# Patient Record
Sex: Male | Born: 1935 | Race: White | Hispanic: No | Marital: Married | Smoking: Former smoker
Health system: Southern US, Community
[De-identification: ages and names within clinical notes are randomized; demographics above are authoritative.]

## PROBLEM LIST (undated history)

## (undated) DIAGNOSIS — Z8614 Personal history of Methicillin resistant Staphylococcus aureus infection: Secondary | ICD-10-CM

## (undated) DIAGNOSIS — K449 Diaphragmatic hernia without obstruction or gangrene: Secondary | ICD-10-CM

## (undated) DIAGNOSIS — M1990 Unspecified osteoarthritis, unspecified site: Secondary | ICD-10-CM

## (undated) DIAGNOSIS — S92901A Unspecified fracture of right foot, initial encounter for closed fracture: Secondary | ICD-10-CM

## (undated) DIAGNOSIS — I2510 Atherosclerotic heart disease of native coronary artery without angina pectoris: Secondary | ICD-10-CM

## (undated) DIAGNOSIS — R55 Syncope and collapse: Secondary | ICD-10-CM

## (undated) DIAGNOSIS — Z8719 Personal history of other diseases of the digestive system: Secondary | ICD-10-CM

## (undated) DIAGNOSIS — L02214 Cutaneous abscess of groin: Secondary | ICD-10-CM

## (undated) DIAGNOSIS — C801 Malignant (primary) neoplasm, unspecified: Secondary | ICD-10-CM

## (undated) DIAGNOSIS — K219 Gastro-esophageal reflux disease without esophagitis: Secondary | ICD-10-CM

## (undated) DIAGNOSIS — I10 Essential (primary) hypertension: Secondary | ICD-10-CM

## (undated) HISTORY — DX: Essential (primary) hypertension: I10

## (undated) HISTORY — DX: Gastro-esophageal reflux disease without esophagitis: K21.9

## (undated) HISTORY — PX: YAG LASER APPLICATION: SHX6189

## (undated) HISTORY — DX: Cutaneous abscess of groin: L02.214

## (undated) HISTORY — DX: Personal history of Methicillin resistant Staphylococcus aureus infection: Z86.14

## (undated) HISTORY — DX: Unspecified fracture of right foot, initial encounter for closed fracture: S92.901A

## (undated) HISTORY — DX: Syncope and collapse: R55

## (undated) HISTORY — PX: COLONOSCOPY: SHX174

## (undated) HISTORY — PX: APPENDECTOMY: SHX54

## (undated) HISTORY — PX: HERNIA REPAIR: SHX51

## (undated) HISTORY — DX: Gastro-esophageal reflux disease without esophagitis: K44.9

## (undated) HISTORY — DX: Atherosclerotic heart disease of native coronary artery without angina pectoris: I25.10

## (undated) SURGERY — Surgical Case
Anesthesia: *Unknown

---

## 1999-03-15 ENCOUNTER — Ambulatory Visit (HOSPITAL_COMMUNITY): Admission: RE | Admit: 1999-03-15 | Discharge: 1999-03-15 | Payer: Self-pay | Admitting: Gastroenterology

## 2000-06-20 ENCOUNTER — Ambulatory Visit (HOSPITAL_BASED_OUTPATIENT_CLINIC_OR_DEPARTMENT_OTHER): Admission: RE | Admit: 2000-06-20 | Discharge: 2000-06-20 | Payer: Self-pay | Admitting: Plastic Surgery

## 2002-08-03 ENCOUNTER — Emergency Department (HOSPITAL_COMMUNITY): Admission: EM | Admit: 2002-08-03 | Discharge: 2002-08-03 | Payer: Self-pay | Admitting: Emergency Medicine

## 2003-07-16 ENCOUNTER — Encounter (INDEPENDENT_AMBULATORY_CARE_PROVIDER_SITE_OTHER): Payer: Self-pay | Admitting: Specialist

## 2003-07-16 ENCOUNTER — Ambulatory Visit (HOSPITAL_COMMUNITY): Admission: RE | Admit: 2003-07-16 | Discharge: 2003-07-16 | Payer: Self-pay | Admitting: Gastroenterology

## 2005-01-24 ENCOUNTER — Ambulatory Visit: Payer: Self-pay | Admitting: Family Medicine

## 2005-02-07 ENCOUNTER — Ambulatory Visit: Payer: Self-pay | Admitting: Family Medicine

## 2005-03-04 ENCOUNTER — Ambulatory Visit: Payer: Self-pay | Admitting: Family Medicine

## 2005-08-17 ENCOUNTER — Ambulatory Visit: Payer: Self-pay | Admitting: Family Medicine

## 2006-02-20 ENCOUNTER — Ambulatory Visit: Payer: Self-pay | Admitting: Family Medicine

## 2007-02-28 ENCOUNTER — Ambulatory Visit: Payer: Self-pay | Admitting: Family Medicine

## 2007-02-28 DIAGNOSIS — E785 Hyperlipidemia, unspecified: Secondary | ICD-10-CM | POA: Insufficient documentation

## 2007-02-28 DIAGNOSIS — I10 Essential (primary) hypertension: Secondary | ICD-10-CM | POA: Insufficient documentation

## 2007-06-22 ENCOUNTER — Emergency Department (HOSPITAL_COMMUNITY): Admission: EM | Admit: 2007-06-22 | Discharge: 2007-06-22 | Payer: Self-pay | Admitting: Emergency Medicine

## 2007-07-13 ENCOUNTER — Encounter: Payer: Self-pay | Admitting: Family Medicine

## 2007-11-05 ENCOUNTER — Ambulatory Visit: Payer: Self-pay | Admitting: Family Medicine

## 2007-11-05 DIAGNOSIS — R0789 Other chest pain: Secondary | ICD-10-CM | POA: Insufficient documentation

## 2007-11-14 ENCOUNTER — Encounter: Payer: Self-pay | Admitting: Family Medicine

## 2007-11-14 ENCOUNTER — Ambulatory Visit: Payer: Self-pay

## 2007-11-14 HISTORY — PX: CORONARY ANGIOPLASTY WITH STENT PLACEMENT: SHX49

## 2007-11-19 ENCOUNTER — Encounter: Payer: Self-pay | Admitting: Family Medicine

## 2007-11-29 ENCOUNTER — Ambulatory Visit: Payer: Self-pay | Admitting: Cardiovascular Disease

## 2007-11-29 LAB — CONVERTED CEMR LAB
Basophils Absolute: 0 10*3/uL (ref 0.0–0.1)
CO2: 29 meq/L (ref 19–32)
Chloride: 96 meq/L (ref 96–112)
Glucose, Bld: 74 mg/dL (ref 70–99)
Hemoglobin: 16.2 g/dL (ref 13.0–17.0)
INR: 1 (ref 0.8–1.0)
Lymphocytes Relative: 13.7 % (ref 12.0–46.0)
MCHC: 35.1 g/dL (ref 30.0–36.0)
Monocytes Relative: 14.4 % — ABNORMAL HIGH (ref 3.0–12.0)
Neutro Abs: 4.2 10*3/uL (ref 1.4–7.7)
Neutrophils Relative %: 63.8 % (ref 43.0–77.0)
Potassium: 3.6 meq/L (ref 3.5–5.1)
Prothrombin Time: 11.9 s (ref 10.9–13.3)
RDW: 12.1 % (ref 11.5–14.6)
Sodium: 136 meq/L (ref 135–145)
aPTT: 28.4 s (ref 21.7–29.8)

## 2007-12-06 ENCOUNTER — Inpatient Hospital Stay (HOSPITAL_COMMUNITY): Admission: AD | Admit: 2007-12-06 | Discharge: 2007-12-08 | Payer: Self-pay | Admitting: Cardiovascular Disease

## 2007-12-06 ENCOUNTER — Ambulatory Visit: Payer: Self-pay | Admitting: Cardiovascular Disease

## 2007-12-06 ENCOUNTER — Inpatient Hospital Stay (HOSPITAL_BASED_OUTPATIENT_CLINIC_OR_DEPARTMENT_OTHER): Admission: RE | Admit: 2007-12-06 | Discharge: 2007-12-06 | Payer: Self-pay | Admitting: Cardiovascular Disease

## 2007-12-06 HISTORY — PX: CARDIAC CATHETERIZATION: SHX172

## 2007-12-07 ENCOUNTER — Ambulatory Visit: Admission: RE | Admit: 2007-12-07 | Discharge: 2007-12-07 | Payer: Self-pay | Admitting: Cardiovascular Disease

## 2007-12-10 ENCOUNTER — Ambulatory Visit: Payer: Self-pay | Admitting: Family Medicine

## 2007-12-10 DIAGNOSIS — I2510 Atherosclerotic heart disease of native coronary artery without angina pectoris: Secondary | ICD-10-CM | POA: Insufficient documentation

## 2007-12-10 DIAGNOSIS — J029 Acute pharyngitis, unspecified: Secondary | ICD-10-CM | POA: Insufficient documentation

## 2007-12-19 ENCOUNTER — Telehealth: Payer: Self-pay | Admitting: Family Medicine

## 2008-01-03 ENCOUNTER — Ambulatory Visit: Payer: Self-pay | Admitting: Cardiology

## 2008-01-03 ENCOUNTER — Encounter (HOSPITAL_COMMUNITY): Admission: RE | Admit: 2008-01-03 | Discharge: 2008-04-09 | Payer: Self-pay | Admitting: Cardiovascular Disease

## 2008-01-03 ENCOUNTER — Ambulatory Visit: Payer: Self-pay | Admitting: Cardiovascular Disease

## 2008-02-13 ENCOUNTER — Ambulatory Visit: Payer: Self-pay | Admitting: Family Medicine

## 2008-02-18 ENCOUNTER — Ambulatory Visit: Payer: Self-pay | Admitting: Family Medicine

## 2008-02-18 DIAGNOSIS — L0293 Carbuncle, unspecified: Secondary | ICD-10-CM

## 2008-02-18 DIAGNOSIS — L0292 Furuncle, unspecified: Secondary | ICD-10-CM | POA: Insufficient documentation

## 2008-02-28 ENCOUNTER — Telehealth: Payer: Self-pay | Admitting: Family Medicine

## 2008-03-21 ENCOUNTER — Ambulatory Visit: Payer: Self-pay | Admitting: Cardiovascular Disease

## 2008-03-21 ENCOUNTER — Ambulatory Visit: Payer: Self-pay

## 2008-06-23 ENCOUNTER — Ambulatory Visit: Payer: Self-pay | Admitting: Cardiovascular Disease

## 2008-06-23 LAB — CONVERTED CEMR LAB
ALT: 27 units/L (ref 0–53)
AST: 23 units/L (ref 0–37)
Alkaline Phosphatase: 57 units/L (ref 39–117)
Bilirubin, Direct: 0.1 mg/dL (ref 0.0–0.3)
Total Bilirubin: 1 mg/dL (ref 0.3–1.2)

## 2008-08-01 ENCOUNTER — Ambulatory Visit: Payer: Self-pay | Admitting: Family Medicine

## 2008-09-03 ENCOUNTER — Encounter: Payer: Self-pay | Admitting: Family Medicine

## 2008-09-22 ENCOUNTER — Ambulatory Visit: Payer: Self-pay | Admitting: Family Medicine

## 2008-09-22 DIAGNOSIS — K120 Recurrent oral aphthae: Secondary | ICD-10-CM | POA: Insufficient documentation

## 2008-12-12 ENCOUNTER — Ambulatory Visit: Payer: Self-pay | Admitting: Family Medicine

## 2008-12-12 DIAGNOSIS — L259 Unspecified contact dermatitis, unspecified cause: Secondary | ICD-10-CM | POA: Insufficient documentation

## 2008-12-15 ENCOUNTER — Ambulatory Visit: Payer: Self-pay | Admitting: Cardiology

## 2008-12-15 ENCOUNTER — Ambulatory Visit: Payer: Self-pay | Admitting: Cardiovascular Disease

## 2009-02-11 ENCOUNTER — Ambulatory Visit: Payer: Self-pay | Admitting: Family Medicine

## 2009-11-27 ENCOUNTER — Ambulatory Visit: Payer: Self-pay | Admitting: Cardiovascular Disease

## 2009-11-30 ENCOUNTER — Ambulatory Visit: Payer: Self-pay | Admitting: Cardiovascular Disease

## 2009-12-08 ENCOUNTER — Encounter (INDEPENDENT_AMBULATORY_CARE_PROVIDER_SITE_OTHER): Payer: Self-pay | Admitting: *Deleted

## 2009-12-08 LAB — CONVERTED CEMR LAB
Alkaline Phosphatase: 59 units/L (ref 39–117)
BUN: 17 mg/dL (ref 6–23)
Basophils Relative: 0.2 % (ref 0.0–3.0)
Bilirubin, Direct: 0.2 mg/dL (ref 0.0–0.3)
CO2: 28 meq/L (ref 19–32)
Chloride: 103 meq/L (ref 96–112)
Cholesterol: 137 mg/dL (ref 0–200)
Eosinophils Absolute: 0.5 10*3/uL (ref 0.0–0.7)
Eosinophils Relative: 6.4 % — ABNORMAL HIGH (ref 0.0–5.0)
HCT: 46.8 % (ref 39.0–52.0)
LDL Cholesterol: 54 mg/dL (ref 0–99)
Lymphs Abs: 1.2 10*3/uL (ref 0.7–4.0)
MCHC: 34.2 g/dL (ref 30.0–36.0)
MCV: 100.3 fL — ABNORMAL HIGH (ref 78.0–100.0)
Monocytes Absolute: 0.8 10*3/uL (ref 0.1–1.0)
Platelets: 240 10*3/uL (ref 150.0–400.0)
Potassium: 4.5 meq/L (ref 3.5–5.1)
Total Bilirubin: 0.8 mg/dL (ref 0.3–1.2)
Total CHOL/HDL Ratio: 2
Total Protein: 7.1 g/dL (ref 6.0–8.3)
VLDL: 24.4 mg/dL (ref 0.0–40.0)
WBC: 7.4 10*3/uL (ref 4.5–10.5)

## 2010-01-11 ENCOUNTER — Telehealth: Payer: Self-pay | Admitting: Family Medicine

## 2010-02-05 ENCOUNTER — Ambulatory Visit: Payer: Self-pay | Admitting: Family Medicine

## 2010-02-21 ENCOUNTER — Encounter: Payer: Self-pay | Admitting: Family Medicine

## 2010-05-02 ENCOUNTER — Emergency Department (HOSPITAL_COMMUNITY)
Admission: EM | Admit: 2010-05-02 | Discharge: 2010-05-02 | Payer: Self-pay | Source: Home / Self Care | Admitting: Emergency Medicine

## 2010-06-13 LAB — CONVERTED CEMR LAB
ALT: 25 units/L (ref 0–53)
Alkaline Phosphatase: 64 units/L (ref 39–117)
BUN: 15 mg/dL (ref 6–23)
Bilirubin Urine: NEGATIVE
Bilirubin, Direct: 0.3 mg/dL (ref 0.0–0.3)
Blood in Urine, dipstick: NEGATIVE
Calcium: 9.7 mg/dL (ref 8.4–10.5)
Cholesterol: 181 mg/dL (ref 0–200)
Eosinophils Absolute: 0.3 10*3/uL (ref 0.0–0.6)
GFR calc Af Amer: 60 mL/min
GFR calc non Af Amer: 49 mL/min
Glucose, Bld: 96 mg/dL (ref 70–99)
Glucose, Urine, Semiquant: NEGATIVE
HDL: 54.5 mg/dL (ref >39.0)
Hemoglobin: 15.4 g/dL (ref 13.0–17.0)
Ketones, urine, test strip: NEGATIVE
Lymphocytes Relative: 13.3 % (ref 12.0–46.0)
MCHC: 34.6 g/dL (ref 30.0–36.0)
MCV: 98.3 fL (ref 78.0–100.0)
Monocytes Absolute: 1.1 10*3/uL — ABNORMAL HIGH (ref 0.2–0.7)
Monocytes Relative: 10 % (ref 3.0–11.0)
Neutro Abs: 7.7 10*3/uL (ref 1.4–7.7)
Nitrite: NEGATIVE
Platelets: 241 10*3/uL (ref 150–400)
Potassium: 3.7 meq/L (ref 3.5–5.1)
Specific Gravity, Urine: 1.015
TSH: 1.37 microintl units/mL (ref 0.35–5.50)
Total Protein: 7 g/dL (ref 6.0–8.3)
Triglycerides: 122 mg/dL (ref 0–149)
pH: 5.5

## 2010-06-15 NOTE — Assessment & Plan Note (Signed)
Summary: flu shot/cjr   Nurse Visit   Allergies: No Known Drug Allergies  Immunizations Administered:  Influenza Vaccine # 1:    Vaccine Type: Fluvax MCR    Site: left deltoid    Mfr: GlaxoSmithKline    Dose: 0.5 ml    Route: IM    Given by: Kathrynn Speed CMA    Exp. Date: 11/13/2010    Lot #: [id number]    VIS given: 12/08/09 version given February 05, 2010.  Flu Vaccine Consent Questions:    Do you have a history of severe allergic reactions to this vaccine? no    Any prior history of allergic reactions to egg and/or gelatin? no    Do you have a sensitivity to the preservative Thimersol? no    Do you have a past history of Guillan-Barre Syndrome? no    Do you currently have an acute febrile illness? no    Have you ever had a severe reaction to latex? no    Vaccine information given and explained to patient? no  Orders Added: 1)  Influenza Vaccine MCR [00025]

## 2010-06-15 NOTE — Progress Notes (Signed)
Summary: new rx needed  Phone Note Call from Patient Call back at Home Phone [phone]   Caller: Patient---walk in Summary of Call: wants rx for zostervax. please send to [facility]. Initial call taken by: Warnell Forester,  January 11, 2010 8:21 AM  Follow-up for Phone Call        please send this rx in Follow-up by: Nelwyn Salisbury MD,  January 11, 2010 8:41 AM    New/Updated Medications: ZOSTAVAX 09811 UNT/0.65ML SOLR (ZOSTER VACCINE LIVE) 1 injection Prescriptions: ZOSTAVAX 91478 UNT/0.65ML SOLR (ZOSTER VACCINE LIVE) 1 injection  #1 x 0   Entered by:   Raechel Ache, RN   Authorized by:   Nelwyn Salisbury MD   Signed by:   Raechel Ache, RN on 01/11/2010   Method used:   Electronically to        [facility]* (retail)       [address]       [place], [place]  [id number]       Ph: [phone]       Fax: [phone]   RxID:   [id number]

## 2010-06-15 NOTE — Letter (Signed)
Summary: Custom - Lipid  [facility], Main Office  [address]   [place], [place] [address]   Phone: [phone]  Fax: [phone]     December 08, 2009 MRN: [id number]   [name] [name] [address] [place], [place]  [address]   Dear [name],  We have reviewed your cholesterol results.  They are as follows:     Total Cholesterol:    137 (Desirable: less than 200)       HDL  Cholesterol:     58.20  (Desirable: greater than 40 for men and 50 for women)       LDL Cholesterol:       54  (Desirable: less than 100 for low risk and less than 70 for moderate to high risk)       Triglycerides:       122.0  (Desirable: less than 150)  Our recommendations include:These numbers look good. Continue on the same medicine. Sodium, potassium, blood count, PSA, kidney and Liver function are normal. Take care, Dr. Leanora Cover.    Call our office at the number listed above if you have any questions.  Lowering your LDL cholesterol is important, but it is only one of a large number of "risk factors" that may indicate that you are at risk for heart disease, stroke or other complications of hardening of the arteries.  Other risk factors include:   A.  Cigarette Smoking* B.  High Blood Pressure* C.  Obesity* D.   Low HDL Cholesterol (see yours above)* E.   Diabetes Mellitus (higher risk if your is uncontrolled) F.  Family history of premature heart disease G.  Previous history of stroke or cardiovascular disease    *These are risk factors YOU HAVE CONTROL OVER.  For more information, visit .  There is now evidence that lowering the TOTAL CHOLESTEROL AND LDL CHOLESTEROL can reduce the risk of heart disease.  The American Heart Association recommends the following guidelines for the treatment of elevated cholesterol:  1.  If there is now current heart disease and less than two risk factors, TOTAL CHOLESTEROL should be less than 200 and LDL CHOLESTEROL should be less than  100. 2.  If there is current heart disease or two or more risk factors, TOTAL CHOLESTEROL should be less than 200 and LDL CHOLESTEROL should be less than 70.  A diet low in cholesterol, saturated fat, and calories is the cornerstone of treatment for elevated cholesterol.  Cessation of smoking and exercise are also important in the management of elevated cholesterol and preventing vascular disease.  Studies have shown that 30 to 60 minutes of physical activity most days can help lower blood pressure, lower cholesterol, and keep your weight at a healthy level.  Drug therapy is used when cholesterol levels do not respond to therapeutic lifestyle changes (smoking cessation, diet, and exercise) and remains unacceptably high.  If medication is started, it is important to have you levels checked periodically to evaluate the need for further treatment options.  Thank you,    [facility] [employer] Team

## 2010-06-15 NOTE — Miscellaneous (Signed)
Summary: Zostavax/[facility]   Imported By: Maryln Gottron 03/02/2010 14:19:32  _____________________________________________________________________  External Attachment:    Type:   Image     Comment:   External Document

## 2010-06-15 NOTE — Assessment & Plan Note (Signed)
Summary: yearly/sl      Allergies Added: NKDA  History of Present Illness: [name] [name] seen today in followup for his coronary disease.  In July 2009 he had an abnormal Myoview and had stenting of the right coronary artery.  He is enrolled in the Promise research trial.  His been compliant with his medications.  He is an Herbalist.  He is also  active with his 73-year-old grandson.  He has not t had any new health problems since I last saw him.  Dr. Clent Ridges his primary care doctor.  There's been no chest pain palpitations PND or orthopnea. He needs lab work.  and refills  Current Problems (verified): 1)  Contact Dermatitis  (ICD-692.9) 2)  Aphthous Ulcers  (ICD-528.2) 3)  Boils, Recurrent  (ICD-680.9) 4)  Acute Pharyngitis  (ICD-462) 5)  Coronary Artery Disease  (ICD-414.00) 6)  Chest Discomfort  (ICD-786.59) 7)  Hypertension  (ICD-401.9) 8)  Hyperlipidemia  (ICD-272.4)  Current Medications (verified): 1)  Benazepril-Hydrochlorothiazide 20-25 Mg  Tabs (Benazepril-Hydrochlorothiazide) .... Two Times A Day 2)  Coreg 6.25 Mg  Tabs (Carvedilol) .Marland Kitchen.. 1 By Mouth Two Times A Day 3)  Crestor 20 Mg Tabs (Rosuvastatin Calcium) .Marland Kitchen.. 1 Tablet By Mouth Daily 4)  Plavix 75 Mg Tabs (Clopidogrel Bisulfate) .... Take 1 Tab By Mouth Daily 5)  Bayer Aspirin 325 Mg  Tabs (Aspirin) .... Once Daily 6)  Nitroglycerin 0.4 Mg Subl (Nitroglycerin) .... One Tablet Under Tongue Every 5 Minutes As Needed For Chest Pain---May Repeat Times Three  Allergies (verified): No Known Drug Allergies  Past History:  Past Medical History: Last updated: 12/06/2008 APHTHOUS ULCERS (ICD-528.2) BOILS, RECURRENT (ICD-680.9) ACUTE PHARYNGITIS (ICD-462) CORONARY ARTERY DISEASE (ICD-414.00) CHEST DISCOMFORT (ICD-786.59) HYPERTENSION (ICD-401.9) HYPERLIPIDEMIA (ICD-272.4)  Past Surgical History: Last updated: 12/10/2007 Hiatel hernia Esophageal dilatations x3 Benign skin lesions & basal cell lesions removed from  back Squamous cell cancer removed from forehead Appendectomy colonoscopy 3-05 per Dr. Ewing Schlein cardiac stent placed per Dr. Excell Seltzer 12-07-07  Family History: Last updated: 02/28/2007 Family hx of Stomach Cancer Family History Hypertension Family History of Cardiovascular disorder (heart disease)  Social History: Last updated: 02/28/2007 Married Former Smoker Alcohol use-yes (occasionally) Drug use-no  Review of Systems       Denies fever, malais, weight loss, blurry vision, decreased visual acuity, cough, sputum, SOB, hemoptysis, pleuritic pain, palpitaitons, heartburn, abdominal pain, melena, lower extremity edema, claudication, or rash.   Vital Signs:  Patient profile:   75 year old male Height:      70 inches Weight:      157 pounds BMI:     22.61 Pulse rate:   63 / minute Resp:     16 per minute BP sitting:   112 / 70  (left arm)  Vitals Entered By: Marrion Coy, CNA (November 27, 2009 9:01 AM)  Physical Exam  General:  Affect appropriate Healthy:  appears stated age HEENT: normal Neck supple with no adenopathy JVP normal no bruits no thyromegaly Lungs clear with no wheezing and good diaphragmatic motion Heart:  S1/S2 no murmur,rub, gallop or click PMI normal Abdomen: benighn, BS positve, no tenderness, no AAA no bruit.  No HSM or HJR Distal pulses intact with no bruits No edema Neuro non-focal Skin warm and dry    Impression & Recommendations:  Problem # 1:  CORONARY ARTERY DISEASE (ICD-414.00)  Stable no angina His updated medication list for this problem includes:    Benazepril-hydrochlorothiazide 20-25 Mg Tabs (Benazepril-hydrochlorothiazide) .Marland Kitchen..Marland Kitchen Two times a day  Coreg 6.25 Mg Tabs (Carvedilol) .Marland Kitchen... 1 by mouth two times a day    Plavix 75 Mg Tabs (Clopidogrel bisulfate) .Marland Kitchen... Take 1 tab by mouth daily    Bayer Aspirin 325 Mg Tabs (Aspirin) ..... Once daily    Nitroglycerin 0.4 Mg Subl (Nitroglycerin) ..... One tablet under tongue every 5 minutes as  needed for chest pain---may repeat times three  His updated medication list for this problem includes:    Benazepril-hydrochlorothiazide 20-25 Mg Tabs (Benazepril-hydrochlorothiazide) .Marland Kitchen..Marland Kitchen Two times a day    Coreg 6.25 Mg Tabs (Carvedilol) .Marland Kitchen... 1 by mouth two times a day    Plavix 75 Mg Tabs (Clopidogrel bisulfate) .Marland Kitchen... Take 1 tab by mouth daily    Bayer Aspirin 325 Mg Tabs (Aspirin) ..... Once daily    Nitroglycerin 0.4 Mg Subl (Nitroglycerin) ..... One tablet under tongue every 5 minutes as needed for chest pain---may repeat times three  Orders: EKG w/ Interpretation (93000)  Problem # 2:  HYPERTENSION (ICD-401.9) Well controlled His updated medication list for this problem includes:    Benazepril-hydrochlorothiazide 20-25 Mg Tabs (Benazepril-hydrochlorothiazide) .Marland Kitchen..Marland Kitchen Two times a day    Coreg 6.25 Mg Tabs (Carvedilol) .Marland Kitchen... 1 by mouth two times a day    Bayer Aspirin 325 Mg Tabs (Aspirin) ..... Once daily  Problem # 3:  HYPERLIPIDEMIA (ICD-272.4) LFT's next week.  Continue statin His updated medication list for this problem includes:    Crestor 20 Mg Tabs (Rosuvastatin calcium) .Marland Kitchen... 1 tablet by mouth daily  Patient Instructions: 1)  Your physician recommends that you return for a FASTING lipid profile: MONDAY 7/18 AT 8:30 2)  Your physician wants you to follow-up in:   1 YEAR.  You will receive a reminder letter in the mail two months in advance. If you don't receive a letter, please call our office to schedule the follow-up appointment. Prescriptions: NITROGLYCERIN 0.4 MG SUBL (NITROGLYCERIN) One tablet under tongue every 5 minutes as needed for chest pain---may repeat times three  #25 x 6   Entered by:   Meredith Staggers, RN   Authorized by:   Colon Branch, MD, [facility]   Signed by:   Meredith Staggers, RN on 11/27/2009   Method used:   Electronically to        [facility]* (retail)       [address]       [place], [place]  [address]       Ph: [phone] or  [phone]       Fax: [phone]   RxID:   [id number] PLAVIX 75 MG TABS (CLOPIDOGREL BISULFATE) Take 1 tab by mouth daily  #30 x 12   Entered by:   Meredith Staggers, RN   Authorized by:   Colon Branch, MD, [facility]   Signed by:   Meredith Staggers, RN on 11/27/2009   Method used:   Electronically to        [facility]* (retail)       [address]       [place], [place]  [address]       Ph: [phone] or [phone]       Fax: [phone]   RxID:   [id number] COREG 6.25 MG  TABS (CARVEDILOL) 1 by mouth two times a day  #60 x 12   Entered by:   Meredith Staggers, RN   Authorized by:   Colon Branch, MD, [facility]   Signed by:   Meredith Staggers, RN on 11/27/2009   Method used:  Electronically to        [facility]* (retail)       [address]       [place], [place]  [address]       Ph: [phone] or [phone]       Fax: [phone]   RxID:   [id number] BENAZEPRIL-HYDROCHLOROTHIAZIDE 20-25 MG  TABS (BENAZEPRIL-HYDROCHLOROTHIAZIDE) two times a day  #60 x 12   Entered by:   Meredith Staggers, RN   Authorized by:   Colon Branch, MD, [facility]   Signed by:   Meredith Staggers, RN on 11/27/2009   Method used:   Electronically to        [facility]* (retail)       [address]       [place], [place]  [address]       Ph: [phone] or [phone]       Fax: [phone]   RxID:   [id number] CRESTOR 20 MG TABS (ROSUVASTATIN CALCIUM) 1 tablet by mouth daily  #90 x 3   Entered by:   Meredith Staggers, RN   Authorized by:   Colon Branch, MD, [facility]   Signed by:   Meredith Staggers, RN on 11/27/2009   Method used:   Print then Give to Patient   RxID:   [id number]

## 2010-07-27 ENCOUNTER — Encounter: Payer: Self-pay | Admitting: Family Medicine

## 2010-07-28 ENCOUNTER — Ambulatory Visit (INDEPENDENT_AMBULATORY_CARE_PROVIDER_SITE_OTHER): Payer: Medicare Other | Admitting: Family Medicine

## 2010-07-28 ENCOUNTER — Encounter: Payer: Self-pay | Admitting: Family Medicine

## 2010-07-28 VITALS — BP 130/80 | HR 84 | Temp 98.2°F | Wt 158.0 lb

## 2010-07-28 DIAGNOSIS — K4020 Bilateral inguinal hernia, without obstruction or gangrene, not specified as recurrent: Secondary | ICD-10-CM

## 2010-07-28 NOTE — Progress Notes (Signed)
  Subjective:    Patient ID: [name] [name], male    DOB: [birth year], 75 y.o.   MRN: [id number]  HPI Here asking about 2 non-painful lumps in the groin area that he discovered while in the shower a few days ago. He has never noticed these before.    Review of Systems  Constitutional: Negative.   Gastrointestinal: Negative.   Genitourinary: Negative.        Objective:   Physical Exam  Constitutional: He appears well-developed and well-nourished.  Abdominal: Soft. Bowel sounds are normal. He exhibits no distension. There is no tenderness. There is no rebound and no guarding.       He has bilateral moderate sized reducible non-tender direct inguinal hernias           Assessment & Plan:  These need to be checked by Surgery to determine if repairs are necessary

## 2010-09-22 ENCOUNTER — Encounter (INDEPENDENT_AMBULATORY_CARE_PROVIDER_SITE_OTHER): Payer: Self-pay | Admitting: Surgery

## 2010-09-28 NOTE — Discharge Summary (Signed)
NAME:  [name] [name], [name] [name]             ACCOUNT NO.:  [id number]   MEDICAL RECORD NO.:  [id number]          PATIENT TYPE:  INP   LOCATION:  6523                         FACILITY:  [facility]   PHYSICIAN:  Jesse Sans. Wall, MD, FACCDATE OF BIRTH:  Jan 24, 1936   DATE OF ADMISSION:  12/06/2007  DATE OF DISCHARGE:  12/08/2007                               DISCHARGE SUMMARY   PROCEDURES:  1. Cardiac catheterization.  2. Coronary arteriogram.  3. Left ventriculogram.  4. Percutaneous transluminal coronary angioplasty and a drug-eluting      stent to 1 vessel.   PRIMARY FINAL DISCHARGE DIAGNOSIS:  Unstable anginal pain.   SECONDARY DIAGNOSES:  1. Hiatal hernia/reflux.  2. Hypertension.  3. Status post appendectomy and vocal cord polyps.  4. Dyslipidemia with a total cholesterol of 171, triglycerides 144,      HDL 37, and LDL 105.   TIME AT DISCHARGE:  36 minutes.   HOSPITAL COURSE:  [name] [name] is a 75 year old male with no previous  history of coronary artery disease.  He was evaluated by Dr. Eden Emms for  chest pain and had an abnormal Myoview.  He had a JB left  catheterization, which showed significant single-vessel disease and was  admitted for further evaluation and percutaneous intervention.   The cardiac catheterization showed 40-50% stenosis in the LAD, D1 also  had a 40-50% ostial lesion.  The RCA had a distal 90% lesion, and his EF  was 60%.  On December 07, 2007, [name] [name] had PTCA and a drug-eluting  stent under the Platinum study, reducing the stenosis to 0.  He  tolerated the procedure well.   A lipid profile was performed, result described above.  Crestor was  added to lower his LDL.  He has continued on his other home medications  except the verapamil was discontinued and he has had Coreg added to his  medications.  He had some bradycardia overnight but was asymptomatic  with it, and this was generally while he was asleep.   On December 08, 2007, [name] [name] was evaluated by Dr.  Daleen Squibb.  [name] [name]  was considered stable for discharge with outpatient followup arranged.   DISCHARGE INSTRUCTIONS:  His activity level is to be increased  gradually.  He is to stick to a low-sodium heart-healthy diet.  He is to  call our office for problems with the catheterization site.  He is to  follow up with Dr. Eden Emms in our office, we will call him.  He is to  follow up with Dr. Clent Ridges as needed.   DISCHARGE MEDICATIONS:  1. Nexium 40 mg b.i.d.  2. Verapamil is on hold.  3. Benazepril HCT, restart on December 09, 2007.  4. Aspirin 325 mg daily.  5. Plavix 75 mg daily.  6. Crestor 20 mg daily.  7. Nitroglycerin sublingual p.r.n.  8. Coreg 6.25 mg b.i.d.      Theodore Demark, PA-C      Jesse Sans. Daleen Squibb, MD, [facility]  Electronically Signed    RB/MEDQ  D:  12/08/2007  T:  12/08/2007  Job:  [id number]   cc:   Jeannett Senior A.  Sarajane Jews, MD

## 2010-09-28 NOTE — Assessment & Plan Note (Signed)
[facility]                            CARDIOLOGY OFFICE NOTE   NAME:Dike, CARLOS HEBER                    MRN:          [id number]  DATE:06/23/2008                            DOB:          06-20-1935    Kaien returns today for followup.  He had a stent to the RCA by Dr.  Excell Seltzer at the end of July.  He has been doing well.  He had moderate  left-sided disease.  He finished cardiac rehab.  He is very active man.  He continues to golf and do yard work.  Although, he has had some help,  now he is primarily doing the mowing.   He has not had a significant chest pain, PND, and orthopnea.  He is  taking his medications on a regular basis.  He is not having any  significant myalgias on Crestor.  He needs followup LFTs.   He has not had any bleeding diathesis, taking his aspirin and Plavix.   He will need to be on Plavix at least until August of this year.   REVIEW OF SYSTEMS:  Otherwise negative.  He is enjoying taking care of  his grandchildren.  His daughter works at [employer].  His wife  used to substitute at the day school, but is now home helping him with  the grandchildren.   He has no known allergies.   MEDICATIONS:  1. He takes Nexium 40 a day.  2. Plavix 75 a day.  3. Aspirin a day.  4. Crestor 20 a day.  5. Benazepril HCTZ.  6. Carvedilol 6.25 half-a-tablet a day.   PHYSICAL EXAMINATION:  GENERAL:  Remarkable for blood pressure 110/64,  pulse 60 and regular, weight 159, respiratory rate 14, and afebrile.  HEENT:  Unremarkable.  NECK:  Carotids are normal without bruit.  No lymphadenopathy,  thyromegaly, or JVP elevation.  LUNGS:  Clear.  Good diaphragmatic motion.  No wheezing.  CARDIOVASCULAR:  S1 and S2.  Normal heart sounds.  PMI normal.  ABDOMEN:  Benign.  Bowel sounds positive.  No AAA.  No bruit.  No  hepatosplenomegaly.  No hepatojugular reflux.  EXTREMITIES:  Distal pulses are intact.  No edema.  NEUROLOGIC:   Nonfocal.  SKIN:  Warm and dry.  MUSCULOSKELETAL:  No muscular weakness.   IMPRESSION:  1. Coronary artery disease, stent to the right coronary artery with      residual left anterior descending coronary artery disease.  We will      try to see if he will stop taking Nexium due to interaction with      Plavix which he must be on an H2 blocker.  I believe Protonix would      be less of an interaction in regards to P450 system.  2. Hyperlipidemia.  Continue Crestor.  Check LFTs today.  His total      cholesterol has only been 170 in the past.  3. Hypertension.  Currently, well controlled.  Continue current dose      of benazepril and hydrochlorothiazide.  Consider followup BMET in 6      months.  Overall, I think Elvin is doing fine.  I will see him back in 6 months.     Noralyn Pick. Eden Emms, MD, [facility]  Electronically Signed    PCN/MedQ  DD: 06/23/2008  DT: 06/24/2008  Job #: [id number]

## 2010-09-28 NOTE — Assessment & Plan Note (Signed)
[facility]                            CARDIOLOGY OFFICE NOTE   NAME:[name], [name]                    MRN:          [id number]  DATE:01/03/2008                            DOB:          Jan 23, 1936    HISTORY OF PRESENT ILLNESS:  [name] [name] returns today for followup.  He just had an angioplasty of the distal right.  He had moderate mid LAD  disease.   His symptoms have resolved.  He had a markedly positive Myoview with  inferior wall ischemia.   He had a myriad of questions today and I spent quite a bit of time with  [name] [name].   His first question revolved around his Nexium.  I had a long discussion  with him about the interaction of H2 blockers with Plavix.  I would  prefer for him to be on no H2 blockers at this time.  I think in  general, it is a class effect he will have antacids for the time being.  Next, he had significant concerns about any residual coronary artery  disease.  He actually got pictures from his heart cath.  I explained to  him that his LAD disease was moderate and that he would need followup  stress testing.  I would suspect that he would get angina with this  progression as well.   Next, he had issues regarding his aspirin therapy.  It seemed to make  his epigastric pain worse.  I told him to try to take an enteric-coated  aspirin, but I prefer for him to be on adult aspirin.  He is about to  start cardiac rehab.  He had some questions regarding the activity  level.  He was going to stop doing his yard work until October and I  told him that would be fine, so long as he had no complications with his  cardiac rehab, I told him he could go back to this.  He also has been  having some fatigue and I told him this should not be from his heart.  He is on Crestor, but is not having significant muscular aches and pains  and I prefer not to stop this.  We did switch him from verapamil to  carvedilol, given his documented  coronary artery disease now, but he is  on low-dose.  We will see how this goes, but again I do not think any  morning fatigue is due to a beta-blocker.   He also had questions regarding followup stress testing.  I told him  that his baseline EKG is normal.  He may not be able to have a Myoview  if he remains asymptomatic, but we will initially start with an exercise  treadmill in 3 months.   REVIEW OF SYSTEMS:  Otherwise negative.  I congratulated him on his  first grandchild [name] [name].   His daughter is a Teacher, early years/pre over at [employer].   CURRENT MEDICATIONS:  1. Nexium 40 b.i.d. to be held.  2. Benazepril and hydrochlorothiazide 20/25.  3. Carvedilol 6.25 b.i.d.  4. Plavix 75 a  day.  5. Aspirin a day.  6. Crestor 20 a day.   PHYSICAL EXAMINATION:  GENERAL:  Remarkable for a thin, talkative white  male in no distress.  VITAL SIGNS:  Blood pressure is 120/70, pulse 70 and regular, weight  160, respiratory rate 14, afebrile.  HEENT:  Unremarkable.  NECK:  Carotids are normal without bruit, no lymphadenopathy, no  thyromegaly, no JVP elevation.  LUNGS:  Clear.  Good diaphragmatic motion.  No wheezing.  HEART:  S1 and S2.  Normal heart sounds, PMI normal.  ABDOMEN:  Benign.  Bowel sounds positive.  No AAA, no tenderness, no  bruit, no hepatosplenomegaly, no hepatojugular reflux.  EXTREMITIES:  Cath sites were well healed.  No femoral bruits.  Distal pulses are  intact.  No edema.  NEURO:  Nonfocal.  SKIN:  Warm and dry.  MUSCULOSKELETAL:  No muscular weakness.   EKG shows sinus rhythm with first-degree heart block with the PR  interval of 236.   IMPRESSION:  1. Coronary artery disease, recent stent to the distal right coronary      artery.  Continue aspirin and Plavix.  Followup treadmill in 3      months, residual moderate left anterior descending (coronary      artery) disease.  2. Hypercholesterolemia, descending coronary artery disease.   Continue      Crestor, lipid and liver profile in 3 months.  3. Reflux.  Try to avoid H2 blockers.  Given interaction with Plavix,      may reinstitute them in a year, p.r.n. antacids.  4. Hypertension.  Continue benazepril and HCTZ 20/25.  Followup BMET      in 3 months.  5. Fatigue, not likely related to carvedilol, but we will have to      follow this.  He is not anemic and his thyroid studies have been      normal.  6. First-degree heart block of 236 milliseconds.  We will not increase      his beta-blocker any further, given this and the fact that he has      some fatigue.  We will have to follow this, but I do not think he      is at high risk for higher grade heart block.  Overall, I think the      patient is doing well.  Clearly his angina has gone.  He had an      extremely tight distal right coronary artery lesion.  Dr. Excell Seltzer      got a wonderful result and we will continue to monitor his residual      disease.   Again, considerable time was spent with the patient today going over all  of his questions.     Noralyn Pick. Eden Emms, MD, [facility]  Electronically Signed    PCN/MedQ  DD: 01/03/2008  DT: 01/04/2008  Job #: [id number]

## 2010-09-28 NOTE — Assessment & Plan Note (Signed)
[facility]                            CARDIOLOGY OFFICE NOTE   NAME:[name] [name]                    MRN:          [id number]  DATE:11/29/2007                            DOB:          [birth year]    Mr. [name] [name] is a pleasant 75 year old patient referred for abnormal  stress test.  He does have occasional pressure in his chest.  He tends  to minimize it.  It tends to occur with exertion particularly when he  golfs or does gardening.  He had one bad episode of exertional dyspnea  and diaphoresis about a month ago.  He had a stress Myoview study which  I reviewed.  The patient had significant inferior wall ischemia at mid  and basal level.  His LV function was normal.  I talked to him and his  wife at length regarding a positive stress test, his symptoms, and the  need for heart cath.  The risks including stroke were discussed.  He is  willing to proceed.  His coronary risk factors include primarily  hypertension which has had borderline control.   He is currently not having rest pain.  He has not had nitroglycerin to  take.   REVIEW OF SYSTEMS:  Otherwise remarkable for longstanding history of  hiatal hernia and reflux.  He is unsure whether his symptoms may be due  to this.   He also has had occasional pains while eating.   PAST MEDICAL HISTORY:  Remarkable for reflux and hypertension.  He is a  previous smoker, quitting in 41.  He drinks 4-5 beers per week.   He has had previous appendectomy and vocal cord polyp removal.   FAMILY HISTORY:  Remarkable for father dying at age 63 of stomach  cancer, mother dying of natural causes at 43.   SOCIAL HISTORY:  The patient is retired.  He is happily married.  His  wife is with him.  They seem to get along great.  He enjoys golfing and  gardening.  He has 2 older children.  He quit smoking in 65 and drinks  occasionally.   ALLERGIES:  The patient denies any allergies.   MEDICATIONS:  He is  currently taking,  1. Nexium 40 b.i.d.  2. Verapamil 240 a day.  3. Benazepril 20/25.  4. Aspirin a day.   PHYSICAL EXAMINATION:  GENERAL:  Remarkable for healthy-appearing  elderly white male in no distress.  Affect appropriate.  VITAL SIGNS:  Blood pressure 130/80, pulse 70 and regular, respiratory  rate 14, and afebrile.  HEENT:  Unremarkable.  NECK:  Carotids are normal without bruit.  No lymphadenopathy,  thyromegaly, or JVP elevation.  LUNGS:  Clear.  Good diaphragmatic motion.  No wheezing.  S1 and S2 with  a systolic murmur.  PMI normal.  ABDOMEN:  Benign.  Bowel sounds are positive.  No AAA.  No tenderness.  No bruit.  No hepatosplenomegaly or hepatojugular reflux.  No  tenderness.  EXTREMITIES:  Distal pulses are intact.  No edema.  NEUROLOGIC:  Nonfocal.  SKIN:  Warm and dry.  MUSCULOSKELETAL:  No muscular weakness.  Baseline EKG shows LVH.   I reviewed his Myoview in depth.  I also reviewed a 2D echocardiogram  which he had, which showed some mild aortic sclerosis and mild MR.   IMPRESSION:  1. Chest pain and dyspnea, likely anginal equivalent.  Heart      catheterization to be performed.  Risks discussed.  Blood work and      chest x-ray to be done.  2. Murmur.  Echo seems fairly benign.  I will have to review it.  He      certainly has a moderate systolic murmur.  There was no evidence of      significant aortic stenosis, and his MR was only graded as mild.      No need for subacute bacterial endocarditis prophylaxis.  3. Hypertension, currently borderline controlled.  May need to adjust      medications.  We will see if he has coronary artery disease.      Continue current dose of calcium blocker, diuretic, and angiotensin-      converting enzyme inhibitor.  4. History of reflux.  We will continue H2 blocker.   I told the patient there is a small chance that this was a false  positive Myoview, but he has had symptoms and he had a fairly convincing   reversible defect in the inferior wall.  Further recommendation would  based on the results of his catheterization.     [name] [name]. [name] Emms, MD, [facility]  Electronically Signed    PCN/MedQ  DD: 11/29/2007  DT: 11/30/2007  Job #: [id number]   cc:   Jeannett Senior A. Clent Ridges, MD

## 2010-09-28 NOTE — Cardiovascular Report (Signed)
NAME:  [name] [name], [name] [name]             ACCOUNT NO.:  [id number]   MEDICAL RECORD NO.:  [id number]          PATIENT TYPE:  INP   LOCATION:  6523                         FACILITY:  [facility]   PHYSICIAN:  Noralyn Pick. Eden Emms, MD, FACCDATE OF BIRTH:  08/05/1935   DATE OF PROCEDURE:  DATE OF DISCHARGE:                            CARDIAC CATHETERIZATION   A 75 year old patient with chest pain, abnormal Myoview with inferior  wall ischemia.   Cine catheterization was done from the right femoral artery using 4-  French catheters.   Standard JL4 catheter was used to engage the left main.   The right coronary artery had somewhat of an unusual takeoff.  It  appeared to be somewhat high in the right coronary cusp and directed  posteriorly.   We were finally able to cannulate the right coronary artery using a  right Amplatz 1 catheter.   Left main coronary artery was normal.   Left anterior descending artery had some calcification in the proximal  and mid vessel, there appeared to be 40% to utmost 50% bifurcation  disease at the mid LAD and the takeoff of the first diagonal branch.  The remainder of the LAD was normal.  First diagonal branch was involved  with the 40%-50% ostial lesion and is otherwise normal.   Circumflex coronary artery was normal.   The right coronary artery was normal in its proximal and mid vessel.  The distal vessel had a 90% discrete lesion, PDA and PLA were normal.   RAO ventriculography.  RAO ventriculography was normal, EF was 60%.  There was no gradient across the aortic valve and no MR.  LV pressure  was 150/11.  Aortic pressure was 116/61.   IMPRESSION:  The patient essentially has significant single-vessel  disease.  He has symptoms of angina with an inferior wall defect on  Myoview.  I do not think his left anterior descending disease is  critical.  He will be admitted to the hospital loaded with Plavix and  heparin will be started.  He will be referred for  angioplasty and  stenting of the distal right coronary artery in the morning.      Noralyn Pick. Eden Emms, MD, [facility]  Electronically Signed     PCN/MEDQ  D:  12/06/2007  T:  12/06/2007  Job:  [id number]

## 2010-10-01 NOTE — Op Note (Signed)
NAME:  ALEXANDERJAMES, BERG                       ACCOUNT NO.:  [id number]   MEDICAL RECORD NO.:  [id number]                   PATIENT TYPE:  AMB   LOCATION:  ENDO                                 [facility]   PHYSICIAN:  Petra Kuba, M.D.                 DATE OF BIRTH:  [birth year]   DATE OF PROCEDURE:  07/16/2003  DATE OF DISCHARGE:                                 OPERATIVE REPORT   PROCEDURE:  Colonoscopy with biopsy.   INDICATIONS:  History of colon polyps.  Due for repeat screening.   Consent was signed after risks, benefits, methods, options thoroughly  discussed multiple times in the past.   MEDICATIONS:  Demerol 60, Versed 8.   PROCEDURE:  Rectal inspection was pertinent for external hemorrhoids, small.  Digital exam was negative.  The video pediatric adjustable colonoscope was  inserted and fairly easily advanced around the colon to the cecum.  This did  require some abdominal pressure but no position changes.  On insertion, some  occasional left-sided diverticula were seen but no other abnormality.  The  cecum was identified by the appendiceal orifice and the ileocecal valve.  The scope was slowly withdrawn.  There were two tiny questionable polyps in  the cecum, which were cold-biopsied and put in the same container.  The  scope was further withdrawn.  The prep was adequate.  There was some liquid  stool that required washing and suctioning but on slow withdrawal back to  the rectum, other than a rare occasional left-sided diverticula, no other  abnormalities were seen.  Specifically, no polyps, tumors, masses, or other  abnormalities.  Once back in the rectum, anorectal pull-through and  retroflexion confirmed some smaller hemorrhoids.  The scope was reinserted a  short ways up the left side of the colon, airway suctioned, and the scope  removed.  Patient tolerated the procedure well.  There was no obvious  immediate complication.   DIAGNOSES:  1.  Internal/external hemorrhoids.  2. Occasional left-sided diverticula.  3. Questionable two tiny cecal polyps, cold-biopsied.  4. Otherwise within normal limits to the cecum.   PLAN:  1. Await pathology.  2. Probably recheck colon screening in five years.  3. Continue workup with the EGD, otherwise yearly rectals and guaiacs per     Dr. Clent Ridges.                                               Petra Kuba, M.D.    MEM/MEDQ  D:  07/16/2003  T:  07/16/2003  Job:  [id number]   cc:   Jeannett Senior A. Clent Ridges, M.D. [facility]

## 2010-10-01 NOTE — Op Note (Signed)
NAME:  DWAINE, PRINGLE                       ACCOUNT NO.:  [id number]   MEDICAL RECORD NO.:  [id number]                   PATIENT TYPE:  AMB   LOCATION:  ENDO                                 FACILITY:  [facility]   PHYSICIAN:  Petra Kuba, M.D.                 DATE OF BIRTH:  Sep 08, 1935   DATE OF PROCEDURE:  07/16/2003  DATE OF DISCHARGE:                                 OPERATIVE REPORT   PROCEDURE:  Esophagogastroduodenoscopy.   INDICATIONS:  Patient with upper tract symptoms.  Want to re-evaluate.   Consent was signed after risks, benefits, methods, options thoroughly  discussed multiple times in the past.   MEDICATIONS:  Additionally none, since this follows the colonoscopy.   PROCEDURE:  The video endoscope was inserted by direct vision.  The  esophagus was normal.  There was no signs of Barrett's or esophagitis.  He  did have a small hiatal hernia with a widely patent, thin, fibrous ring.  The scope was passed into the stomach and advanced through a normal antrum,  normal pylorus into a normal duodenal bulb, and around the __________  interval into the second portion of the duodenum.  The scope was slowly  withdrawn back to the bulb.  A good look there ruled out any ulcer or  malformation.  The scope was withdrawn back to the stomach and retroflexed.  High in the cardia, the hiatal hernia was confirmed.  The fundus, lesser and  greater curve were normal except for a few small greater curve polyps, which  were cold-biopsied on straight visualization.  Straight visualization of the  stomach did not reveal any additional findings.  Air was suctioned, and the  scope was slowly withdrawn.  Again, a good look at the esophagus was normal.  The scope was removed.  In the posterior pharynx, there was a questionable  tiny polyp on the vocal cord.  Better documentation was obtained.  The scope  was slowly withdrawn.  The patient tolerated the procedure well.  There was  no obvious  immediate complication.   DIAGNOSES:  1. Small hiatal hernia with widely patent thin ring.  2. A few stomach greater curve polyps, cold-biopsied.  3. A questionable tiny vocal cord polyp.  4. Otherwise normal esophagogastroduodenoscopy.   PLAN:  1. Continue pump inhibitors.  2. Try to show the picture to an ENT doctor to see if they have any     concerns.  3. Watch for signs of hoarseness or other symptoms.  4. Be happy to see back p.r.n.                                               Petra Kuba, M.D.    MEM/MEDQ  D:  07/16/2003  T:  07/16/2003  Job:  [id number]  cc:   Tera Mater. Clent Ridges, M.D. [facility]

## 2010-10-01 NOTE — Assessment & Plan Note (Signed)
[facility] OFFICE NOTE   NAME:[name] [name] KANN                    MRN:          [id number]  DATE:02/20/2006                            DOB:          [birth year]   This is a 75 year old gentleman here for complete physical examination.  He  is doing well and has no complaints.  He says his blood pressure has been a  little higher than usual over the past few months, although he feels fine.  He does try to eat right and does get regular exercise.  Of note, he had an  unremarkable colonoscopy in 2005 and is not due for another one until 2010.   Further details of his past medical history, family history, social history,  habits etc, refer to the last physical note dated February 07, 2005.   ALLERGIES:  None.   CURRENT MEDICATIONS:  1. Benazepril HCTZ 20/25 once a day.  2. Verapamil SR 240 mg daily.  3. Aspirin 81 mg daily.  4. Prilosec OTC as needed.   OBJECTIVE:  Weight 165, blood pressure 150/88.  Pulse 64 and regular.  GENERAL:  He appears to be doing well.  SKIN:  Is clear.  Eyes clear.  Sclerae, pharynx clear.  NECK:  Supple without lymphadenopathy, masses.  LUNGS:  Clear.  CARDIAC:  Regular rate and rhythm without gallops, murmurs or rubs.  Distal  pulses are full.  ABDOMEN:  Soft, normal bowel sounds, nontender, no masses.  GENITALIA:  Normal male.  RECTAL:  No mass or tenderness.  Prostate is within normal limits.  Stool  hemoccult negative.  EXTREMITIES:  No clubbing, cyanosis or edema.  NEUROLOGIC:  Grossly intact.   EKG:  Within normal limits.   ASSESSMENT AND PLAN:  1. Complete physical.  He is fasting so we will get the usual      laboratories.  He was also given a      flu shot today.  2. Hypertension.  We will change benazepril HCT to 20/12.5 to take 2      tablets daily.            ______________________________  Tera Mater Clent Ridges, MD   SAF/MedQ DD:  02/20/2006 DT:  02/22/2006  Job #:  [id number]

## 2010-11-19 ENCOUNTER — Encounter: Payer: Self-pay | Admitting: Cardiovascular Disease

## 2010-11-21 ENCOUNTER — Inpatient Hospital Stay (INDEPENDENT_AMBULATORY_CARE_PROVIDER_SITE_OTHER)
Admission: RE | Admit: 2010-11-21 | Discharge: 2010-11-21 | Disposition: A | Discharge status: Home / Self Care | Payer: Medicare Other | Source: Ambulatory Visit | Attending: Family Medicine | Admitting: Family Medicine

## 2010-11-21 ENCOUNTER — Ambulatory Visit (INDEPENDENT_AMBULATORY_CARE_PROVIDER_SITE_OTHER): Payer: Medicare Other

## 2010-11-21 DIAGNOSIS — T07XXXA Unspecified multiple injuries, initial encounter: Secondary | ICD-10-CM

## 2010-11-22 ENCOUNTER — Ambulatory Visit (INDEPENDENT_AMBULATORY_CARE_PROVIDER_SITE_OTHER): Payer: Medicare Other | Admitting: Cardiovascular Disease

## 2010-11-22 ENCOUNTER — Encounter: Payer: Self-pay | Admitting: Cardiovascular Disease

## 2010-11-22 DIAGNOSIS — I10 Essential (primary) hypertension: Secondary | ICD-10-CM

## 2010-11-22 DIAGNOSIS — E785 Hyperlipidemia, unspecified: Secondary | ICD-10-CM

## 2010-11-22 DIAGNOSIS — I2510 Atherosclerotic heart disease of native coronary artery without angina pectoris: Secondary | ICD-10-CM

## 2010-11-22 MED ORDER — BENAZEPRIL-HYDROCHLOROTHIAZIDE 20-25 MG PO TABS: 1.0000 | ORAL_TABLET | Freq: Every day | ORAL | Status: DC

## 2010-11-22 MED ORDER — ROSUVASTATIN CALCIUM 20 MG PO TABS: 20.0000 mg | ORAL_TABLET | Freq: Every day | ORAL | Status: DC

## 2010-11-22 MED ORDER — CARVEDILOL 6.25 MG PO TABS: 6.2500 mg | ORAL_TABLET | Freq: Every day | ORAL | Status: DC

## 2010-11-22 MED ORDER — CLOPIDOGREL BISULFATE 75 MG PO TABS: 75.0000 mg | ORAL_TABLET | Freq: Every day | ORAL | Status: DC

## 2010-11-22 NOTE — Assessment & Plan Note (Signed)
Stable with no angina and good activity level.  Continue medical Rx Ok to stop Plavix before hernia surgery

## 2010-11-22 NOTE — Assessment & Plan Note (Signed)
Well controlled.  Continue current medications and low sodium Dash type diet.    

## 2010-11-22 NOTE — Progress Notes (Signed)
[name] [name] seen today in followup for his coronary disease. In July 2009 he had an abnormal Myoview and had stenting of the right coronary artery. He is enrolled in the [employer] research trial. His been compliant with his medications. 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 He has not t had any new health problems since I last saw him. Dr. Clent Ridges his primary care doctor. There's been no chest pain palpitations PND or orthopnea.  He needs lab work. and refills  [name] [name] off ladder over weekend and has lots of bruising over arms.  No broken bones on ER visit.  Needs hernia surgery with Dr Rayburn Ma next month.  Ok to stop Plavix 5 days before   ROS: Denies fever, malais, weight loss, blurry vision, decreased visual acuity, cough, sputum, SOB, hemoptysis, pleuritic pain, palpitaitons, heartburn, abdominal pain, melena, lower extremity edema, claudication, or rash.  All other systems reviewed and negative  General: Affect appropriate Healthy:  appears stated age HEENT: normal Neck supple with no adenopathy JVP normal no bruits no thyromegaly Lungs clear with no wheezing and good diaphragmatic motion Heart:  S1/S2 no murmur,rub, gallop or click PMI normal Abdomen: benighn, BS positve, no tenderness, no AAA no bruit.  No HSM or HJR Distal pulses intact with no bruits No edema Neuro non-focal Skin warm and dry\  Bruising over arms and left eye from fall No muscular weakness   Current Outpatient Prescriptions  Medication Sig Dispense Refill  . aspirin 325 MG tablet Take 325 mg by mouth daily.        . benazepril-hydrochlorthiazide (LOTENSIN HCT) 20-25 MG per tablet Take 1 tablet by mouth daily.       . carvedilol (COREG) 6.25 MG tablet Take 6.25 mg by mouth daily.       . clopidogrel (PLAVIX) 75 MG tablet Take 75 mg by mouth daily.        . Multiple Vitamin (MULTIVITAMIN PO) Take by mouth daily.        . nitroGLYCERIN (NITROSTAT) 0.4 MG SL tablet Place 0.4 mg under the  tongue every 5 (five) minutes as needed.        . rosuvastatin (CRESTOR) 20 MG tablet Take 20 mg by mouth daily.          Allergies  Review of patient's allergies indicates no known allergies.  Electrocardiogram:  Assessment and Plan

## 2010-11-22 NOTE — Assessment & Plan Note (Signed)
Cholesterol is at goal.  Continue current dose of statin and diet Rx.  No myalgias or side effects.  F/U  LFT's in 6 months. Lab Results  Component Value Date   LDLCALC 54 11/30/2009             

## 2010-11-22 NOTE — Patient Instructions (Signed)
Your physician wants you to follow-up in:  12 months.  You will receive a reminder letter in the mail two months in advance. If you don't receive a letter, please call our office to schedule the follow-up appointment.   

## 2010-12-01 ENCOUNTER — Ambulatory Visit: Payer: Medicare Other | Admitting: Family Medicine

## 2010-12-08 ENCOUNTER — Ambulatory Visit (INDEPENDENT_AMBULATORY_CARE_PROVIDER_SITE_OTHER): Payer: Medicare Other | Admitting: Surgery

## 2010-12-08 ENCOUNTER — Encounter (INDEPENDENT_AMBULATORY_CARE_PROVIDER_SITE_OTHER): Payer: Self-pay | Admitting: Surgery

## 2010-12-08 DIAGNOSIS — K4020 Bilateral inguinal hernia, without obstruction or gangrene, not specified as recurrent: Secondary | ICD-10-CM

## 2010-12-08 NOTE — Progress Notes (Signed)
[name] [name] is a 75 y.o. male.    Chief Complaint  Patient presents with  . Other    re eval of hernia    HPI HPI He is here today for followup of his bilateral inguinal hernias. I saw him earlier this year. He is now ready to have his hernia surgery. He has had no complaints since I saw him last. Past Medical History  Diagnosis Date  . Abscess of groin, left   . Hx MRSA infection     after heart surgery several yrs ago  . Coronary artery disease   . Hypertension   . Gastroesophageal reflux disease with hiatal hernia   . Abnormal stress test   . Chest pressure   . Exertional dyspnea     Past Surgical History  Procedure Date  . Cardiac catheterization 12/06/07    60%  . Appendectomy     Family History  Problem Relation Age of Onset  . Stomach cancer      family hx  . Hypertension      family hx  . Heart disease      family hx  . Hypertension Sister   . Hypertension Brother     Social History History  Substance Use Topics  . Smoking status: Former Games developer  . Smokeless tobacco: Not on file  . Alcohol Use: Yes     3-4 BEERS A WEEK    No Known Allergies  Current Outpatient Prescriptions  Medication Sig Dispense Refill  . aspirin 325 MG tablet Take 325 mg by mouth daily.        . benazepril-hydrochlorthiazide (LOTENSIN HCT) 20-25 MG per tablet Take 1 tablet by mouth daily.  30 tablet  12  . carvedilol (COREG) 6.25 MG tablet Take 1 tablet (6.25 mg total) by mouth daily.  30 tablet  12  . clopidogrel (PLAVIX) 75 MG tablet Take 1 tablet (75 mg total) by mouth daily.  30 tablet  12  . Multiple Vitamin (MULTIVITAMIN PO) Take by mouth daily.        . nitroGLYCERIN (NITROSTAT) 0.4 MG SL tablet Place 0.4 mg under the tongue every 5 (five) minutes as needed.        . rosuvastatin (CRESTOR) 20 MG tablet Take 1 tablet (20 mg total) by mouth daily.  30 tablet  12    Review of Systems ROS Negative for chest pain fever shortness of breath or abdominal pain Physical  Exam Physical Exam  On physical exam, he has bilateral reducible inguinal hernias There were no vitals taken for this visit.  Assessment/Plan Patient with bilateral inguinal hernias. He will now be scheduled for bilateral left epididymal hernia  Mesh. I discussed the risks of surgery which include but are not limited to bleeding, infection, injury to shrink structures, nerve entrapment, chronic pain, recurrence etc. he understands and wishes to proceed. Surgery will be scheduled. Tajon Moring A 12/08/2010, 2:20 PM

## 2010-12-09 ENCOUNTER — Ambulatory Visit: Payer: Medicare Other | Admitting: Family Medicine

## 2010-12-16 ENCOUNTER — Other Ambulatory Visit (INDEPENDENT_AMBULATORY_CARE_PROVIDER_SITE_OTHER): Payer: Self-pay | Admitting: Surgery

## 2010-12-16 ENCOUNTER — Encounter (HOSPITAL_COMMUNITY): Payer: Medicare Other

## 2010-12-16 ENCOUNTER — Ambulatory Visit (HOSPITAL_COMMUNITY)
Admission: RE | Admit: 2010-12-16 | Discharge: 2010-12-16 | Disposition: A | Discharge status: Home / Self Care | Payer: Medicare Other | Source: Ambulatory Visit | Attending: Surgery | Admitting: Surgery

## 2010-12-16 DIAGNOSIS — Z01810 Encounter for preprocedural cardiovascular examination: Secondary | ICD-10-CM | POA: Insufficient documentation

## 2010-12-16 DIAGNOSIS — Z79899 Other long term (current) drug therapy: Secondary | ICD-10-CM | POA: Insufficient documentation

## 2010-12-16 DIAGNOSIS — Z01818 Encounter for other preprocedural examination: Secondary | ICD-10-CM

## 2010-12-16 DIAGNOSIS — I2510 Atherosclerotic heart disease of native coronary artery without angina pectoris: Secondary | ICD-10-CM | POA: Insufficient documentation

## 2010-12-16 DIAGNOSIS — Z01812 Encounter for preprocedural laboratory examination: Secondary | ICD-10-CM | POA: Insufficient documentation

## 2010-12-16 DIAGNOSIS — I10 Essential (primary) hypertension: Secondary | ICD-10-CM | POA: Insufficient documentation

## 2010-12-16 DIAGNOSIS — K4090 Unilateral inguinal hernia, without obstruction or gangrene, not specified as recurrent: Secondary | ICD-10-CM | POA: Insufficient documentation

## 2010-12-16 LAB — BASIC METABOLIC PANEL
BUN: 13 mg/dL (ref 6–23)
CO2: 24 mEq/L (ref 19–32)
Calcium: 9.3 mg/dL (ref 8.4–10.5)
Creatinine, Ser: 0.77 mg/dL (ref 0.50–1.35)
Glucose, Bld: 118 mg/dL — ABNORMAL HIGH (ref 70–99)

## 2010-12-16 LAB — CBC
HCT: 41.8 % (ref 39.0–52.0)
MCHC: 34.7 g/dL (ref 30.0–36.0)
MCV: 92.9 fL (ref 78.0–100.0)
RDW: 12.8 % (ref 11.5–15.5)

## 2010-12-20 ENCOUNTER — Other Ambulatory Visit: Payer: Self-pay | Admitting: *Deleted

## 2010-12-20 ENCOUNTER — Telehealth: Payer: Self-pay | Admitting: Cardiovascular Disease

## 2010-12-20 MED ORDER — NITROGLYCERIN 0.4 MG SL SUBL: 0.4000 mg | SUBLINGUAL_TABLET | SUBLINGUAL | Status: DC | PRN

## 2010-12-20 NOTE — Telephone Encounter (Signed)
Pt's nitro has not been opened but expired has had it 6 months, wants to know if he can still use it or does he need a new rx?pt  [phone] , Pharmacy [facility]

## 2010-12-20 NOTE — Telephone Encounter (Signed)
Spoke with pt, questions answered [name] [name]  

## 2010-12-24 ENCOUNTER — Ambulatory Visit (HOSPITAL_COMMUNITY)
Admission: RE | Admit: 2010-12-24 | Discharge: 2010-12-25 | Disposition: A | Discharge status: Home / Self Care | Payer: Medicare Other | Source: Ambulatory Visit | Attending: Surgery | Admitting: Surgery

## 2010-12-24 DIAGNOSIS — I2510 Atherosclerotic heart disease of native coronary artery without angina pectoris: Secondary | ICD-10-CM | POA: Insufficient documentation

## 2010-12-24 DIAGNOSIS — I10 Essential (primary) hypertension: Secondary | ICD-10-CM | POA: Insufficient documentation

## 2010-12-24 DIAGNOSIS — Z79899 Other long term (current) drug therapy: Secondary | ICD-10-CM | POA: Insufficient documentation

## 2010-12-24 DIAGNOSIS — K4020 Bilateral inguinal hernia, without obstruction or gangrene, not specified as recurrent: Secondary | ICD-10-CM | POA: Insufficient documentation

## 2010-12-24 DIAGNOSIS — Z7982 Long term (current) use of aspirin: Secondary | ICD-10-CM | POA: Insufficient documentation

## 2010-12-27 ENCOUNTER — Telehealth (INDEPENDENT_AMBULATORY_CARE_PROVIDER_SITE_OTHER): Payer: Self-pay

## 2010-12-27 NOTE — Telephone Encounter (Signed)
[name] [name] called in for follow up appt. For lap hernia. Patient not available when Tsuei had open spots.

## 2010-12-29 NOTE — Op Note (Signed)
NAMEMarland Kitchen  KELAN, PRITT NO.:  [id number]  MEDICAL RECORD NO.:  [id number]  LOCATION:  1529                         FACILITY:  [facility]  PHYSICIAN:  Abigail Miyamoto, M.D. DATE OF BIRTH:  10/29/1935  DATE OF PROCEDURE:  12/24/2010 DATE OF DISCHARGE:                              OPERATIVE REPORT   PREOPERATIVE DIAGNOSIS:  Bilateral inguinal hernias.  POSTOPERATIVE DIAGNOSIS:  Bilateral inguinal hernias.  PROCEDURE:  Bilateral laparoscopic inguinal repair with mesh.  SURGEON:  Abigail Miyamoto, M.D.  ANESTHESIA:  General and Exparel.  ESTIMATED BLOOD LOSS:  Minimal.  FINDINGS:  The patient has bilateral direct inguinal hernias.  PROCEDURE IN DETAIL:  The patient was brought into the operative room and identified as [name] [name].  He was placed on the operative room table and general anesthesia was induced.  His abdomen was then prepped and draped in the usual sterile fashion.  Using a #15 blade, a small vertical incision was made below the umbilicus.  This carried down to fascia which was then opened just to the right of midline.  The rectus muscles were identified and elevated.  The dissecting balloon  was then passed underneath the rectus muscle and manipulated towards the pubis. The dissecting balloon was then insufflated under direct vision dissecting out the preperitoneal space.  The dissecting balloon was then removed and insufflation was begun with carbon dioxide.  Two 5-mm ports were placed in the patient's midline under direct vision.  Dissection was then carried out in the right inguinal floor.  The patient had an obvious large right direct hernia defect.  I was able to identify the testicular cord structures well and found no evidence of indirect hernias.  I then turned my attention toward the left groin and again the patient had a large direct inguinal hernia without any evidence of indirect hernias with examination of the testicular cord  structures.  At this point, 2 separate 6 inch x 6 inch  pieces of Ultrapro Prolene mesh brought to the field.  I placed the first piece through the port of the umbilicus and opened in onlay fashion on the left groin.  I then tacked using the SecureStrap absorbable tacker to Cooper's ligament up the medial abdominal wall and out laterally.  Wide coverage of the direct defect appeared to be completed.  I then brought the second piece of mesh onto the field and fashioned appropriately as well.  I placed it in the camera port and then opened in onlay fashion on the right groin. Again using the absorbable tacker, I tacked it to Cooper's ligament up the medial abdominal wall and out laterally.  Excellent coverage of both inguinal defects appeared to be achieved.  Hemostasis also appeared to be achieved.  The midline ports were removed in the preperitoneal space was seen to collapse appropriately.  I then removed the port at the umbilicus.  I closed the fascia of the umbilicus with a figure-of-eight 0-Vicryl suture.  I anesthetized all incisions with Exparel and performed ilioinguinal nerve blocks with Exparel as well.  The incision was then closed with subcuticularly 4-0 Monocryl sutures.  Steri-Strips and Band-Aids applied.  The patient tolerated the procedure well.  All counts were  correct at the end of the procedure.  The patient was then extubated in operating room and taken in stable condition to recovery room.     Abigail Miyamoto, M.D.     DB/MEDQ  D:  12/24/2010  T:  12/25/2010  Job:  [id number]  Electronically Signed by Abigail Miyamoto M.D. on 12/29/2010 01:40:16 PM

## 2011-01-04 ENCOUNTER — Ambulatory Visit (INDEPENDENT_AMBULATORY_CARE_PROVIDER_SITE_OTHER): Payer: Medicare Other | Admitting: Surgery

## 2011-01-04 ENCOUNTER — Encounter (INDEPENDENT_AMBULATORY_CARE_PROVIDER_SITE_OTHER): Payer: Self-pay | Admitting: Surgery

## 2011-01-04 DIAGNOSIS — Z09 Encounter for follow-up examination after completed treatment for conditions other than malignant neoplasm: Secondary | ICD-10-CM

## 2011-01-04 NOTE — Progress Notes (Signed)
Subjective:     Patient ID: [name] [name], male   DOB: 08-09-1935, 75 y.o.   MRN: [id number]  HPI He is here for his first postoperative visit status post laparoscopic bilateral inguinal hernia repair with mesh. He is doing well. He has some is no discomfort.  Review of Systems     Objective:   Physical Exam On examination, his incisions are healing well. He has mild bruising. There is no evidence of recurrent hernia    Assessment:     Patient status post bilateral laparoscopic inguinal hernia repair with mesh    Plan:     He will refrain from heavy lifting for another one a half weeks. He may then returned to the light activity. I will see him back as needed.

## 2011-01-21 NOTE — Discharge Summary (Signed)
  NAMEMarland Kitchen  [name] [name], [name] [name] NO.:  [id number]  MEDICAL RECORD NO.:  [id number]  LOCATION:  1529                         FACILITY:  [facility]  PHYSICIAN:  Abigail Miyamoto, M.D. DATE OF BIRTH:  08/29/1935  DATE OF ADMISSION:  12/24/2010 DATE OF DISCHARGE:  12/25/2010                              DISCHARGE SUMMARY   DISCHARGE DIAGNOSIS:  Bilateral inguinal hernias, status post bilateral laparoscopic inguinal hernia repair with mesh.  SUMMARY OF HISTORY:  This is a gentleman who presents with bilateral inguinal hernias.  He is 75 years old.  He is being admitted for elective repair.  HOSPITAL COURSE:  Patient was admitted and taken to the operating room where he underwent a bilateral laparoscopic inguinal hernia repair with mesh.  He had uneventful postoperative course, was taken to regular surgical floor and was discharged home on postop day 1, voiding well, and with pain well controlled.  DISCHARGE DIET:  Regular.  DISCHARGE ACTIVITY:  He will do no heavy lifting till he sees me back in the office.  He will resume his home medications.  He may shower.     Abigail Miyamoto, M.D.     DB/MEDQ  D:  01/12/2011  T:  01/13/2011  Job:  [id number]  Electronically Signed by Abigail Miyamoto M.D. on 01/21/2011 12:36:35 PM

## 2011-02-11 LAB — CARDIAC PANEL(CRET KIN+CKTOT+MB+TROPI)
CK, MB: 1.4
Relative Index: INVALID
Total CK: 85
Troponin I: 0.01
Troponin I: <0.01

## 2011-02-11 LAB — BASIC METABOLIC PANEL
BUN: 9
CO2: 24
Calcium: 8.8
Chloride: 103
Creatinine, Ser: 0.96
Creatinine, Ser: 1.1
GFR calc Af Amer: >60
GFR calc non Af Amer: >60
Glucose, Bld: 86
Potassium: 3.7

## 2011-02-11 LAB — CBC
Hemoglobin: 13.9
Hemoglobin: 14.3
MCHC: 33.9
MCHC: 34.1
Platelets: 225
RBC: 4.1 — ABNORMAL LOW
RDW: 12.6
RDW: 13.3
WBC: 6.7

## 2011-02-11 LAB — LIPID PANEL
HDL: 37 — ABNORMAL LOW
Triglycerides: 144
VLDL: 29

## 2011-02-11 LAB — HEPARIN LEVEL (UNFRACTIONATED): Heparin Unfractionated: 0.26 — ABNORMAL LOW

## 2011-02-24 ENCOUNTER — Ambulatory Visit (INDEPENDENT_AMBULATORY_CARE_PROVIDER_SITE_OTHER): Payer: Medicare Other

## 2011-02-24 DIAGNOSIS — Z23 Encounter for immunization: Secondary | ICD-10-CM

## 2011-07-21 DIAGNOSIS — H2510 Age-related nuclear cataract, unspecified eye: Secondary | ICD-10-CM | POA: Diagnosis not present

## 2011-07-21 DIAGNOSIS — H43819 Vitreous degeneration, unspecified eye: Secondary | ICD-10-CM | POA: Diagnosis not present

## 2011-07-21 DIAGNOSIS — H40059 Ocular hypertension, unspecified eye: Secondary | ICD-10-CM | POA: Diagnosis not present

## 2011-10-13 ENCOUNTER — Other Ambulatory Visit: Payer: Self-pay | Admitting: Dermatology

## 2011-10-13 DIAGNOSIS — Z85828 Personal history of other malignant neoplasm of skin: Secondary | ICD-10-CM | POA: Diagnosis not present

## 2011-10-13 DIAGNOSIS — L570 Actinic keratosis: Secondary | ICD-10-CM | POA: Diagnosis not present

## 2011-10-13 DIAGNOSIS — D485 Neoplasm of uncertain behavior of skin: Secondary | ICD-10-CM | POA: Diagnosis not present

## 2011-10-13 DIAGNOSIS — L821 Other seborrheic keratosis: Secondary | ICD-10-CM | POA: Diagnosis not present

## 2011-11-29 ENCOUNTER — Encounter: Payer: Self-pay | Admitting: Cardiovascular Disease

## 2011-11-29 ENCOUNTER — Ambulatory Visit (INDEPENDENT_AMBULATORY_CARE_PROVIDER_SITE_OTHER): Payer: Medicare Other | Admitting: Cardiovascular Disease

## 2011-11-29 VITALS — BP 130/80 | HR 78 | Ht 70.0 in | Wt 155.0 lb

## 2011-11-29 DIAGNOSIS — I2510 Atherosclerotic heart disease of native coronary artery without angina pectoris: Secondary | ICD-10-CM

## 2011-11-29 DIAGNOSIS — I10 Essential (primary) hypertension: Secondary | ICD-10-CM | POA: Diagnosis not present

## 2011-11-29 DIAGNOSIS — E785 Hyperlipidemia, unspecified: Secondary | ICD-10-CM | POA: Diagnosis not present

## 2011-11-29 MED ORDER — ROSUVASTATIN CALCIUM 20 MG PO TABS: 20.0000 mg | ORAL_TABLET | Freq: Every day | ORAL | Status: DC

## 2011-11-29 MED ORDER — CARVEDILOL 6.25 MG PO TABS: 6.2500 mg | ORAL_TABLET | Freq: Every day | ORAL | Status: DC

## 2011-11-29 MED ORDER — BENAZEPRIL-HYDROCHLOROTHIAZIDE 20-25 MG PO TABS: 1.0000 | ORAL_TABLET | Freq: Every day | ORAL | Status: DC

## 2011-11-29 MED ORDER — CLOPIDOGREL BISULFATE 75 MG PO TABS: 75.0000 mg | ORAL_TABLET | Freq: Every day | ORAL | Status: DC

## 2011-11-29 MED ORDER — NITROGLYCERIN 0.4 MG SL SUBL: 0.4000 mg | SUBLINGUAL_TABLET | SUBLINGUAL | Status: DC | PRN

## 2011-11-29 NOTE — Patient Instructions (Signed)
Your physician wants you to follow-up in: YEAR WITH DR NISHAN  You will receive a reminder letter in the mail two months in advance. If you don't receive a letter, please call our office to schedule the follow-up appointment.  Your physician recommends that you continue on your current medications as directed. Please refer to the Current Medication list given to you today. 

## 2011-11-29 NOTE — Progress Notes (Signed)
Patient ID: [name] [name], male   DOB: Nov 30, 1935, 76 y.o.   MRN: [id number] Makyle seen today in followup for his coronary disease. In July 2009 he had an abnormal Myoview and had stenting of the right coronary artery. He is enrolled in the [employer] research trial. His been compliant with his medications. 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 Dr. Clent Ridges his primary care doctor. There's been no chest pain palpitations PND or orthopnea.     ROS: Denies fever, malais, weight loss, blurry vision, decreased visual acuity, cough, sputum, SOB, hemoptysis, pleuritic pain, palpitaitons, heartburn, abdominal pain, melena, lower extremity edema, claudication, or rash.  All other systems reviewed and negative  General: Affect appropriate Healthy:  appears stated age HEENT: normal Neck supple with no adenopathy JVP normal no bruits no thyromegaly Lungs clear with no wheezing and good diaphragmatic motion Heart:  S1/S2 no murmur, no rub, gallop or click PMI normal Abdomen: benighn, BS positve, no tenderness, no AAA no bruit.  No HSM or HJR Distal pulses intact with no bruits No edema Neuro non-focal Skin warm and dry No muscular weakness   Current Outpatient Prescriptions  Medication Sig Dispense Refill  . aspirin 325 MG tablet Take 325 mg by mouth daily.        . benazepril-hydrochlorthiazide (LOTENSIN HCT) 20-25 MG per tablet Take 1 tablet by mouth daily.  30 tablet  12  . carvedilol (COREG) 6.25 MG tablet Take 1 tablet (6.25 mg total) by mouth daily.  30 tablet  12  . clopidogrel (PLAVIX) 75 MG tablet Take 1 tablet (75 mg total) by mouth daily.  30 tablet  12  . Multiple Vitamin (MULTIVITAMIN PO) Take by mouth daily.        . nitroGLYCERIN (NITROSTAT) 0.4 MG SL tablet Place 1 tablet (0.4 mg total) under the tongue every 5 (five) minutes as needed.  25 tablet  2  . rosuvastatin (CRESTOR) 20  MG tablet Take 1 tablet (20 mg total) by mouth daily.  30 tablet  12    Allergies  Review of patient's allergies indicates no known allergies.  Electrocardiogram: NSR rate 74 PR 228 othewise normal done 01/04/11  Assessment and Plan

## 2011-11-29 NOTE — Assessment & Plan Note (Signed)
Cholesterol is at goal.  Continue current dose of statin and diet Rx.  No myalgias or side effects.  F/U  LFT's in 6 months. Lab Results  Component Value Date   LDLCALC 54 11/30/2009             

## 2011-11-29 NOTE — Assessment & Plan Note (Signed)
Stable with no angina and good activity level.  Continue medical Rx  

## 2011-11-29 NOTE — Assessment & Plan Note (Signed)
Well controlled.  Continue current medications and low sodium Dash type diet.    

## 2012-02-21 DIAGNOSIS — L570 Actinic keratosis: Secondary | ICD-10-CM | POA: Diagnosis not present

## 2012-03-02 ENCOUNTER — Ambulatory Visit (INDEPENDENT_AMBULATORY_CARE_PROVIDER_SITE_OTHER): Payer: Medicare Other

## 2012-03-02 DIAGNOSIS — Z23 Encounter for immunization: Secondary | ICD-10-CM | POA: Diagnosis not present

## 2012-03-13 ENCOUNTER — Ambulatory Visit (INDEPENDENT_AMBULATORY_CARE_PROVIDER_SITE_OTHER): Payer: Medicare Other | Admitting: Family Medicine

## 2012-03-13 ENCOUNTER — Encounter: Payer: Self-pay | Admitting: Family Medicine

## 2012-03-13 VITALS — BP 112/68 | HR 63 | Temp 98.4°F | Wt 156.0 lb

## 2012-03-13 DIAGNOSIS — S0001XA Abrasion of scalp, initial encounter: Secondary | ICD-10-CM

## 2012-03-13 DIAGNOSIS — IMO0002 Reserved for concepts with insufficient information to code with codable children: Secondary | ICD-10-CM | POA: Diagnosis not present

## 2012-03-13 NOTE — Progress Notes (Signed)
  Subjective:    Patient ID: [name] [name], male    DOB: 05-26-1935, 76 y.o.   MRN: [id number]  HPI Here for an injury to his forehead 2 days ago at home, when he bumped into his garage door. No LOC or HA. He scraped the skin and had a lot of bleeding at first. Now it has slowed down. Dressing it once a day.    Review of Systems  Constitutional: Negative.   Skin: Positive for wound.       Objective:   Physical Exam  Constitutional: He is oriented to person, place, and time. He appears well-developed and well-nourished.  Neurological: He is alert and oriented to person, place, and time.  Skin:       Superficial abrasion to the scalp vertex, very little bleeding          Assessment & Plan:  The wound looks clean. Dressed with Neosporin and Telfa. Dress daily at home

## 2012-03-30 ENCOUNTER — Other Ambulatory Visit: Payer: Self-pay | Admitting: *Deleted

## 2012-03-30 MED ORDER — BENAZEPRIL-HYDROCHLOROTHIAZIDE 20-25 MG PO TABS: 1.0000 | ORAL_TABLET | Freq: Every day | ORAL | Status: DC

## 2012-04-26 DIAGNOSIS — L570 Actinic keratosis: Secondary | ICD-10-CM | POA: Diagnosis not present

## 2012-06-29 DIAGNOSIS — M25569 Pain in unspecified knee: Secondary | ICD-10-CM | POA: Diagnosis not present

## 2012-08-17 DIAGNOSIS — M25569 Pain in unspecified knee: Secondary | ICD-10-CM | POA: Diagnosis not present

## 2012-08-21 DIAGNOSIS — M25569 Pain in unspecified knee: Secondary | ICD-10-CM | POA: Diagnosis not present

## 2012-08-30 DIAGNOSIS — M23302 Other meniscus derangements, unspecified lateral meniscus, unspecified knee: Secondary | ICD-10-CM | POA: Diagnosis not present

## 2012-08-30 DIAGNOSIS — M23305 Other meniscus derangements, unspecified medial meniscus, unspecified knee: Secondary | ICD-10-CM | POA: Diagnosis not present

## 2012-08-30 DIAGNOSIS — M25569 Pain in unspecified knee: Secondary | ICD-10-CM | POA: Diagnosis not present

## 2012-09-17 ENCOUNTER — Ambulatory Visit (INDEPENDENT_AMBULATORY_CARE_PROVIDER_SITE_OTHER): Payer: Medicare Other | Admitting: Family Medicine

## 2012-09-17 ENCOUNTER — Encounter: Payer: Self-pay | Admitting: Family Medicine

## 2012-09-17 VITALS — BP 118/72 | HR 71 | Temp 98.3°F | Wt 153.0 lb

## 2012-09-17 DIAGNOSIS — L259 Unspecified contact dermatitis, unspecified cause: Secondary | ICD-10-CM | POA: Diagnosis not present

## 2012-09-17 MED ORDER — METHYLPREDNISOLONE 4 MG PO KIT: PACK | ORAL | Status: AC

## 2012-09-17 NOTE — Progress Notes (Signed)
  Subjective:    Patient ID: [name] [name], male    DOB: 1936/02/12, 77 y.o.   MRN: [id number]  HPI Here for 2 days of an itchy rash on the right lower leg. He had been working in his yard this past weekend. He is applying cortisone cream to it. He is due for knee replacement surgery on 10-03-12.   Review of Systems  Constitutional: Negative.   Skin: Positive for rash.       Objective:   Physical Exam  Constitutional: He appears well-developed and well-nourished.  Skin:  Scattered red papules and vesicles on the right lower leg           Assessment & Plan:  Recheck prn

## 2012-09-19 ENCOUNTER — Encounter (HOSPITAL_COMMUNITY): Payer: Self-pay | Admitting: Pharmacy Technician

## 2012-09-19 NOTE — Progress Notes (Signed)
Just a note: we will need orders in EPIC by 5/16 please -  pt coming for preop thank you

## 2012-09-20 ENCOUNTER — Other Ambulatory Visit: Payer: Self-pay | Admitting: Orthopedic Surgery

## 2012-09-20 MED ORDER — DEXAMETHASONE SODIUM PHOSPHATE 10 MG/ML IJ SOLN: 10.0000 mg | Freq: Once | INTRAMUSCULAR | Status: DC

## 2012-09-20 NOTE — Progress Notes (Signed)
Preoperative surgical orders have been place into the Epic hospital system for [name] [name] on 09/20/2012, 12:42 PM  by Patrica Duel for surgery on 10/03/2012.  Preop Knee Scope orders including IV Tylenol and IV Decadron as long as there are no contraindications to the above medications. Avel Peace, PA-C

## 2012-09-24 ENCOUNTER — Ambulatory Visit (INDEPENDENT_AMBULATORY_CARE_PROVIDER_SITE_OTHER): Payer: Medicare Other | Admitting: Family Medicine

## 2012-09-24 ENCOUNTER — Encounter: Payer: Self-pay | Admitting: Family Medicine

## 2012-09-24 VITALS — BP 114/72 | HR 83 | Temp 98.0°F | Wt 153.0 lb

## 2012-09-24 DIAGNOSIS — L259 Unspecified contact dermatitis, unspecified cause: Secondary | ICD-10-CM

## 2012-09-24 DIAGNOSIS — L309 Dermatitis, unspecified: Secondary | ICD-10-CM

## 2012-09-24 MED ORDER — DOXYCYCLINE HYCLATE 100 MG PO CAPS: 100.0000 mg | ORAL_CAPSULE | Freq: Two times a day (BID) | ORAL | Status: AC

## 2012-09-24 MED ORDER — METHYLPREDNISOLONE ACETATE 80 MG/ML IJ SUSP
120.0000 mg | Freq: Once | INTRAMUSCULAR | Status: AC
  Administered 2012-09-24: 120 mg via INTRAMUSCULAR

## 2012-09-24 NOTE — Addendum Note (Signed)
Addended by: Aniceto Boss A on: 09/24/2012 04:43 PM   Modules accepted: Orders

## 2012-09-24 NOTE — Progress Notes (Signed)
  Subjective:    Patient ID: [name] [name], male    DOB: 08-28-1935, 77 y.o.   MRN: [id number]  HPI Here to recheck a rash on the right leg which appeared several weeks ago. We saw him one week ago and thought this may be contact dermatitis. We gave him a Medrol dose pack, and this has helped but the rash is still present. There is no itching or pain. He is scheduled for knee surgery a week from now.    Review of Systems  Constitutional: Negative.   Skin: Positive for rash.       Objective:   Physical Exam  Constitutional: He appears well-developed and well-nourished.  Skin:  Patch of slightly raised papular or shelf like erythematous skin on the lateral right leg. Not warm. It does not blanch.           Assessment & Plan:  This is some sort of dermatitis but now it does not appear to be a contact dermatitis. Given a shot of steroids. It is not likely to be infected but with his upcoming surgery we will cover with Doxycycline.

## 2012-09-28 ENCOUNTER — Encounter (HOSPITAL_COMMUNITY)
Admission: RE | Admit: 2012-09-28 | Discharge: 2012-09-28 | Disposition: A | Discharge status: Home / Self Care | Payer: Medicare Other | Source: Ambulatory Visit | Attending: Orthopedic Surgery | Admitting: Orthopedic Surgery

## 2012-09-28 ENCOUNTER — Encounter (HOSPITAL_COMMUNITY): Payer: Self-pay

## 2012-09-28 ENCOUNTER — Ambulatory Visit (HOSPITAL_COMMUNITY)
Admission: RE | Admit: 2012-09-28 | Discharge: 2012-09-28 | Disposition: A | Discharge status: Home / Self Care | Payer: Medicare Other | Source: Ambulatory Visit | Attending: Orthopedic Surgery | Admitting: Orthopedic Surgery

## 2012-09-28 DIAGNOSIS — Z01812 Encounter for preprocedural laboratory examination: Secondary | ICD-10-CM | POA: Insufficient documentation

## 2012-09-28 DIAGNOSIS — Z87891 Personal history of nicotine dependence: Secondary | ICD-10-CM | POA: Diagnosis not present

## 2012-09-28 DIAGNOSIS — Z01810 Encounter for preprocedural cardiovascular examination: Secondary | ICD-10-CM | POA: Insufficient documentation

## 2012-09-28 DIAGNOSIS — Z01818 Encounter for other preprocedural examination: Secondary | ICD-10-CM | POA: Insufficient documentation

## 2012-09-28 DIAGNOSIS — I700 Atherosclerosis of aorta: Secondary | ICD-10-CM | POA: Insufficient documentation

## 2012-09-28 DIAGNOSIS — R9431 Abnormal electrocardiogram [ECG] [EKG]: Secondary | ICD-10-CM | POA: Diagnosis not present

## 2012-09-28 DIAGNOSIS — I10 Essential (primary) hypertension: Secondary | ICD-10-CM | POA: Diagnosis not present

## 2012-09-28 LAB — BASIC METABOLIC PANEL
CO2: 26 mEq/L (ref 19–32)
Calcium: 9.6 mg/dL (ref 8.4–10.5)
Creatinine, Ser: 0.74 mg/dL (ref 0.50–1.35)
Glucose, Bld: 92 mg/dL (ref 70–99)

## 2012-09-28 LAB — CBC
MCH: 33.9 pg (ref 26.0–34.0)
MCV: 92.1 fL (ref 78.0–100.0)
Platelets: 223 10*3/uL (ref 150–400)
RBC: 5.05 MIL/uL (ref 4.22–5.81)

## 2012-09-28 LAB — SURGICAL PCR SCREEN: MRSA, PCR: NEGATIVE

## 2012-09-28 NOTE — Patient Instructions (Addendum)
20      Your procedure is scheduled on:  Wednesday 10/03/2012  Report to [facility] at 1000  AM.  Call this number if you have problems the morning of surgery: [phone]   Remember:             IF YOU USE CPAP,BRING MASK AND TUBING AM OF SURGERY!   Do not eat food or drink liquids AFTER MIDNIGHT!  Take these medicines the morning of surgery with A SIP OF WATER: Carvedilol   Do not bring valuables to the hospital.  .  Leave suitcase in the car. After surgery it may be brought to your room.  For patients admitted to the hospital, checkout time is 11:00 AM the day of              Discharge.    DO NOT WEAR JEWELRY , MAKE-UP, LOTIONS,POWDERS,PERFUMES!             WOMEN -DO NOT SHAVE LEGS OR UNDERARMS 12 HRS. BEFORE SURGERY!               MEN MAY SHAVE AS USUAL!             CONTACTS,DENTURES OR BRIDGEWORK, FALSE EYELASHES MAY  NOT BE WORN INTO SURGERY!                                           Patients discharged the day of surgery will not be allowed to drive home.  If going home the same day of surgery, must have someone stay with you first 24 hrs.at home and arrange for someone to drive you home from the Hospital.                         YOUR DRIVER IS: Angela-spouse   Special Instructions:             Please read over the following fact sheets that you were given:             1. [facility] PREPARING FOR SURGERY SHEET              2.[facility] INFORMATION              3.[facility]                                        Telford Nab.Maurissa Ambrose,RN,BSN     [phone]                FAILURE TO FOLLOW THESE INSTRUCTIONS MAY RESULT IN  CANCELLATION OF YOUR SURGERY!               Patient Signature:___________________________

## 2012-10-02 NOTE — H&P (Signed)
  CC- [name] [name] is a 77 y.o. male who presents with right  knee pain.  HPI- . Knee Pain: Patient presents with knee pain involving the  right knee. Onset of the symptoms was several months ago. Inciting event: none known. Current symptoms include giving out, pain located laterally, stiffness and swelling. Pain is aggravated by going up and down stairs, pivoting, rising after sitting and squatting.  Patient has had no prior knee problems. Evaluation to date: MRI: abnormal lateral/medial meniscal tears. Treatment to date: corticosteroid injection which was not very effective and rest.  Past Medical History  Diagnosis Date  . Abscess of groin, left   . Hx MRSA infection     after heart surgery several yrs ago  . Coronary artery disease   . Hypertension   . Gastroesophageal reflux disease with hiatal hernia   . Abnormal stress test   . Chest pressure   . Exertional dyspnea     Past Surgical History  Procedure Laterality Date  . Cardiac catheterization  12/06/07    60%  . Appendectomy    . Hernia repair      Prior to Admission medications   Medication Sig Start Date End Date Taking? Authorizing Provider  aspirin 325 MG tablet Take 325 mg by mouth every morning.     Historical Provider, MD  benazepril-hydrochlorthiazide (LOTENSIN HCT) 20-25 MG per tablet Take 1 tablet by mouth every morning.    Historical Provider, MD  carvedilol (COREG) 6.25 MG tablet Take 3.125 mg by mouth 2 (two) times daily with a meal.    Historical Provider, MD  clopidogrel (PLAVIX) 75 MG tablet Take 75 mg by mouth every morning.    Historical Provider, MD  doxycycline (VIBRAMYCIN) 100 MG capsule Take 1 capsule (100 mg total) by mouth 2 (two) times daily. 09/24/12 10/04/12  Nelwyn Salisbury, MD  hydrocortisone cream 1 % Apply 1 application topically 2 (two) times daily. For itching and rash    Historical Provider, MD  Multiple Vitamin (MULTIVITAMIN PO) Take by mouth daily.      Historical Provider, MD   nitroGLYCERIN (NITROSTAT) 0.4 MG SL tablet Place 0.4 mg under the tongue every 5 (five) minutes as needed for chest pain.    Historical Provider, MD  rosuvastatin (CRESTOR) 20 MG tablet Take 20 mg by mouth at bedtime.    Historical Provider, MD   KNEE EXAM antalgic gait, soft tissue tenderness over lateral/medial joint line, no  effusion, negative drawer sign, collateral ligaments intact  Physical Examination: General appearance - alert, well appearing, and in no distress Mental status - alert, oriented to person, place, and time Chest - clear to auscultation, no wheezes, rales or rhonchi, symmetric air entry Heart - normal rate, regular rhythm, normal S1, S2, no murmurs, rubs, clicks or gallops Abdomen - soft, nontender, nondistended, no masses or organomegaly Neurological - alert, oriented, normal speech, no focal findings or movement disorder noted   Asessment/Plan--- Rightt knee lateral and medial meniscal tears- - Plan right knee arthroscopy with meniscal debridement. Procedure risks and potential comps discussed with patient who elects to proceed. Goals are decreased pain and increased function with a high likelihood of achieving both

## 2012-10-03 ENCOUNTER — Ambulatory Visit (HOSPITAL_COMMUNITY)
Admission: RE | Admit: 2012-10-03 | Discharge: 2012-10-03 | Disposition: A | Discharge status: Home / Self Care | Payer: Medicare Other | Source: Ambulatory Visit | Attending: Orthopedic Surgery | Admitting: Orthopedic Surgery

## 2012-10-03 ENCOUNTER — Encounter (HOSPITAL_COMMUNITY): Payer: Self-pay | Admitting: Anesthesiology

## 2012-10-03 ENCOUNTER — Encounter (HOSPITAL_COMMUNITY): Payer: Self-pay | Admitting: *Deleted

## 2012-10-03 ENCOUNTER — Ambulatory Visit (HOSPITAL_COMMUNITY): Payer: Medicare Other | Admitting: Anesthesiology

## 2012-10-03 ENCOUNTER — Encounter (HOSPITAL_COMMUNITY)
Admission: RE | Disposition: A | Discharge status: Home / Self Care | Payer: Self-pay | Source: Ambulatory Visit | Attending: Orthopedic Surgery

## 2012-10-03 DIAGNOSIS — M9420 Chondromalacia, unspecified site: Secondary | ICD-10-CM | POA: Diagnosis not present

## 2012-10-03 DIAGNOSIS — M23349 Other meniscus derangements, anterior horn of lateral meniscus, unspecified knee: Secondary | ICD-10-CM | POA: Diagnosis not present

## 2012-10-03 DIAGNOSIS — I10 Essential (primary) hypertension: Secondary | ICD-10-CM | POA: Insufficient documentation

## 2012-10-03 DIAGNOSIS — K219 Gastro-esophageal reflux disease without esophagitis: Secondary | ICD-10-CM | POA: Insufficient documentation

## 2012-10-03 DIAGNOSIS — IMO0002 Reserved for concepts with insufficient information to code with codable children: Secondary | ICD-10-CM | POA: Diagnosis not present

## 2012-10-03 DIAGNOSIS — M23305 Other meniscus derangements, unspecified medial meniscus, unspecified knee: Secondary | ICD-10-CM | POA: Diagnosis not present

## 2012-10-03 DIAGNOSIS — S83289A Other tear of lateral meniscus, current injury, unspecified knee, initial encounter: Secondary | ICD-10-CM

## 2012-10-03 DIAGNOSIS — I2510 Atherosclerotic heart disease of native coronary artery without angina pectoris: Secondary | ICD-10-CM | POA: Diagnosis not present

## 2012-10-03 DIAGNOSIS — S83281A Other tear of lateral meniscus, current injury, right knee, initial encounter: Secondary | ICD-10-CM

## 2012-10-03 DIAGNOSIS — M23329 Other meniscus derangements, posterior horn of medial meniscus, unspecified knee: Secondary | ICD-10-CM | POA: Diagnosis not present

## 2012-10-03 HISTORY — PX: KNEE ARTHROSCOPY: SHX127

## 2012-10-03 SURGERY — ARTHROSCOPY, KNEE
Anesthesia: General | Site: Knee | Laterality: Right | Wound class: Clean

## 2012-10-03 MED ORDER — ACETAMINOPHEN 10 MG/ML IV SOLN: 1000.0000 mg | Freq: Once | INTRAVENOUS | Status: DC | PRN

## 2012-10-03 MED ORDER — CEFAZOLIN SODIUM-DEXTROSE 2-3 GM-% IV SOLR
2.0000 g | INTRAVENOUS | Status: AC
  Administered 2012-10-03: 2 g via INTRAVENOUS

## 2012-10-03 MED ORDER — KETAMINE HCL 50 MG/ML IJ SOLN
INTRAMUSCULAR | Status: DC | PRN
  Administered 2012-10-03: 5 mg via INTRAMUSCULAR
  Administered 2012-10-03 (×2): 10 mg via INTRAMUSCULAR

## 2012-10-03 MED ORDER — OXYCODONE HCL 5 MG/5ML PO SOLN: 5.0000 mg | Freq: Once | ORAL | Status: DC | PRN

## 2012-10-03 MED ORDER — ACETAMINOPHEN 10 MG/ML IV SOLN
INTRAVENOUS | Status: AC
  Filled 2012-10-03: qty 100

## 2012-10-03 MED ORDER — BUPIVACAINE-EPINEPHRINE 0.25% -1:200000 IJ SOLN
INTRAMUSCULAR | Status: DC | PRN
  Administered 2012-10-03: 20 mL

## 2012-10-03 MED ORDER — ACETAMINOPHEN 10 MG/ML IV SOLN: 1000.0000 mg | Freq: Once | INTRAVENOUS | Status: DC

## 2012-10-03 MED ORDER — FENTANYL CITRATE 0.05 MG/ML IJ SOLN
INTRAMUSCULAR | Status: DC | PRN
  Administered 2012-10-03 (×4): 25 ug via INTRAVENOUS

## 2012-10-03 MED ORDER — BUPIVACAINE-EPINEPHRINE PF 0.25-1:200000 % IJ SOLN
INTRAMUSCULAR | Status: AC
  Filled 2012-10-03: qty 30

## 2012-10-03 MED ORDER — SODIUM CHLORIDE 0.9 % IV SOLN: INTRAVENOUS | Status: DC

## 2012-10-03 MED ORDER — OXYCODONE HCL 5 MG PO TABS: 5.0000 mg | ORAL_TABLET | Freq: Once | ORAL | Status: DC | PRN

## 2012-10-03 MED ORDER — ONDANSETRON HCL 4 MG/2ML IJ SOLN
4.0000 mg | Freq: Once | INTRAMUSCULAR | Status: AC
  Administered 2012-10-03: 4 mg via INTRAVENOUS
  Filled 2012-10-03: qty 2

## 2012-10-03 MED ORDER — PROMETHAZINE HCL 25 MG/ML IJ SOLN: 6.2500 mg | INTRAMUSCULAR | Status: DC | PRN

## 2012-10-03 MED ORDER — MEPERIDINE HCL 50 MG/ML IJ SOLN: 6.2500 mg | INTRAMUSCULAR | Status: DC | PRN

## 2012-10-03 MED ORDER — ACETAMINOPHEN 10 MG/ML IV SOLN
INTRAVENOUS | Status: DC | PRN
  Administered 2012-10-03: 1000 mg via INTRAVENOUS

## 2012-10-03 MED ORDER — CEFAZOLIN SODIUM-DEXTROSE 2-3 GM-% IV SOLR
INTRAVENOUS | Status: AC
  Filled 2012-10-03: qty 50

## 2012-10-03 MED ORDER — LIDOCAINE HCL (CARDIAC) 20 MG/ML IV SOLN
INTRAVENOUS | Status: DC | PRN
  Administered 2012-10-03: 50 mg via INTRAVENOUS

## 2012-10-03 MED ORDER — METHOCARBAMOL 500 MG PO TABS: 500.0000 mg | ORAL_TABLET | Freq: Four times a day (QID) | ORAL | Status: DC

## 2012-10-03 MED ORDER — HYDROMORPHONE HCL PF 1 MG/ML IJ SOLN
0.2500 mg | INTRAMUSCULAR | Status: DC | PRN
  Administered 2012-10-03 (×2): 0.5 mg via INTRAVENOUS

## 2012-10-03 MED ORDER — PROPOFOL 10 MG/ML IV BOLUS
INTRAVENOUS | Status: DC | PRN
  Administered 2012-10-03 (×2): 20 mg via INTRAVENOUS
  Administered 2012-10-03: 150 mg via INTRAVENOUS

## 2012-10-03 MED ORDER — HYDROCODONE-ACETAMINOPHEN 5-325 MG PO TABS: 1.0000 | ORAL_TABLET | Freq: Four times a day (QID) | ORAL | Status: DC | PRN

## 2012-10-03 MED ORDER — LACTATED RINGERS IR SOLN
Status: DC | PRN
  Administered 2012-10-03: 6000 mL

## 2012-10-03 MED ORDER — LACTATED RINGERS IV SOLN: INTRAVENOUS | Status: DC

## 2012-10-03 MED ORDER — LACTATED RINGERS IV SOLN
INTRAVENOUS | Status: DC
  Administered 2012-10-03: 1000 mL via INTRAVENOUS
  Administered 2012-10-03: 14:00:00 via INTRAVENOUS

## 2012-10-03 MED ORDER — CHLORHEXIDINE GLUCONATE 4 % EX LIQD
60.0000 mL | Freq: Once | CUTANEOUS | Status: DC
  Filled 2012-10-03: qty 60

## 2012-10-03 MED ORDER — HYDROMORPHONE HCL PF 1 MG/ML IJ SOLN
INTRAMUSCULAR | Status: AC
  Filled 2012-10-03: qty 1

## 2012-10-03 MED ORDER — LACTATED RINGERS IV SOLN
INTRAVENOUS | Status: DC | PRN
  Administered 2012-10-03: 11:00:00 via INTRAVENOUS

## 2012-10-03 SURGICAL SUPPLY — 23 items
BANDAGE ELASTIC 6 VELCRO ST LF (GAUZE/BANDAGES/DRESSINGS) ×2 IMPLANT
BLADE 4.2CUDA (BLADE) ×2 IMPLANT
CLOTH BEACON ORANGE TIMEOUT ST (SAFETY) ×2 IMPLANT
CUFF TOURN SGL QUICK 34 (TOURNIQUET CUFF) ×1
CUFF TRNQT CYL 34X4X40X1 (TOURNIQUET CUFF) ×1 IMPLANT
DRAPE U-SHAPE 47X51 STRL (DRAPES) ×2 IMPLANT
DRSG EMULSION OIL 3X3 NADH (GAUZE/BANDAGES/DRESSINGS) ×2 IMPLANT
DURAPREP 26ML APPLICATOR (WOUND CARE) ×2 IMPLANT
GLOVE BIO SURGEON STRL SZ8 (GLOVE) ×2 IMPLANT
GLOVE BIOGEL PI IND STRL 8 (GLOVE) ×1 IMPLANT
GLOVE BIOGEL PI INDICATOR 8 (GLOVE) ×1
GOWN STRL NON-REIN LRG LVL3 (GOWN DISPOSABLE) ×2 IMPLANT
MANIFOLD NEPTUNE II (INSTRUMENTS) ×4 IMPLANT
PACK ARTHROSCOPY WL (CUSTOM PROCEDURE TRAY) ×2 IMPLANT
PACK ICE MAXI GEL EZY WRAP (MISCELLANEOUS) ×6 IMPLANT
PADDING CAST COTTON 6X4 STRL (CAST SUPPLIES) ×2 IMPLANT
POSITIONER SURGICAL ARM (MISCELLANEOUS) ×2 IMPLANT
SET ARTHROSCOPY TUBING (MISCELLANEOUS) ×1
SET ARTHROSCOPY TUBING LN (MISCELLANEOUS) ×1 IMPLANT
SUT ETHILON 4 0 PS 2 18 (SUTURE) ×2 IMPLANT
TOWEL OR 17X26 10 PK STRL BLUE (TOWEL DISPOSABLE) ×2 IMPLANT
WAND 90 DEG TURBOVAC W/CORD (SURGICAL WAND) ×2 IMPLANT
WRAP KNEE MAXI GEL POST OP (GAUZE/BANDAGES/DRESSINGS) ×4 IMPLANT

## 2012-10-03 NOTE — Op Note (Signed)
Preoperative diagnosis-  Right knee medial and lateral meniscal tears  Postoperative diagnosis Right- knee medial and lateral meniscal tears    Procedure- Right knee arthroscopy with medial and lateral meniscal debridement    Surgeon- Gus Rankin. Nivedita Mirabella, MD  Anesthesia-General  EBL-  minimal Complications- None  Condition- PACU - hemodynamically stable.  Brief clinical note- -[name] [name] is a 77 y.o.  male with a several month history of right knee pain and mechanical symptoms. Exam and history suggested medial and lateral meniscal tears confirmed by MRI. The patient presents now for arthroscopy and debridement   Procedure in detail -       After successful administration of General anesthetic, a tourmiquet is placed high on the Right  thigh and the Right lower extremity is prepped and draped in the usual sterile fashion. Time out is performed by the surgical team. Standard superomedial and inferolateral portal sites are marked and incisions made with an 11 blade. The inflow cannula is passed through the superomedial portal and camera through the inferolateral portal and inflow is initiated. Arthroscopic visualization proceeds.      The undersurface of the patella and trochlea are visualized and they are normal. The medial and lateral gutters are visualized and there are  no loose bodies. Flexion and valgus force is applied to the knee and the medial compartment is entered. A spinal needle is passed into the joint through the site marked for the inferomedial portal. A small incision is made and the dilator passed into the joint. The findings for the medial compartment are there is a tear of the body and posterior horn of the medial meniscus but no chondral defects. The tear is debrided to a stable base with baskets and a shaver and sealed off with the Arthrocare.     The intercondylar notch is visualized and the ACL appears normal. The lateral compartment is entered and the findings are  tear of the anterior horn and body of the latera meniscus with grade II chondromalacia tibial surface without any unstable chondral defect . The tear is debrided to a stable base with baskets and a shaver and sealed off with the Arthrocare.      The joint is again inspected and there are no other tears, defects or loose bodies identified. The arthroscopic equipment is then removed from the inferior portals which are closed with interrupted 4-0 nylon. 20 ml of .25% Marcaine with epinephrine are injected through the inflow cannula and the cannula is then removed and the portal closed with nylon. The incisions are cleaned and dried and a bulky sterile dressing is applied. The patient is then awakened and transported to recovery in stable condition.   10/03/2012, 1:45 PM

## 2012-10-03 NOTE — Interval H&P Note (Signed)
History and Physical Interval Note:  10/03/2012 12:54 PM  [name] [name]  has presented today for surgery, with the diagnosis of RIGHT KNEE LATERAL AND MEDIAL MENISCIAL TEAR   The various methods of treatment have been discussed with the patient and family. After consideration of risks, benefits and other options for treatment, the patient has consented to  Procedure(s): RIGHT KNEEE ARTHROSCOPY WITH DEBRIDEMENT  (Right) as a surgical intervention .  The patient's history has been reviewed, patient examined, no change in status, stable for surgery.  I have reviewed the patient's chart and labs.  Questions were answered to the patient's satisfaction.     Loanne Drilling

## 2012-10-03 NOTE — Transfer of Care (Signed)
Immediate Anesthesia Transfer of Care Note  Patient: [name] [name]  Procedure(s) Performed: Procedure(s) (LRB): RIGHT KNEEE ARTHROSCOPY WITH DEBRIDEMENT  (Right)  Patient Location: PACU  Anesthesia Type: General  Level of Consciousness: sedated, patient cooperative and responds to stimulaton  Airway & Oxygen Therapy: Patient Spontanous Breathing and Patient connected to face mask oxgen  Post-op Assessment: Report given to PACU RN and Post -op Vital signs reviewed and stable  Post vital signs: Reviewed and stable  Complications: No apparent anesthesia complications

## 2012-10-03 NOTE — Anesthesia Preprocedure Evaluation (Addendum)
Anesthesia Evaluation  Patient identified by MRN, date of birth, ID band Patient awake    Reviewed: Allergy & Precautions, H&P , NPO status , Patient's Chart, lab work & pertinent test results, reviewed documented beta blocker date and time   Airway Mallampati: II TM Distance: >3 FB Neck ROM: Full    Dental  (+) Dental Advisory Given and Teeth Intact   Pulmonary shortness of breath and with exertion,  breath sounds clear to auscultation        Cardiovascular hypertension, Pt. on medications and Pt. on home beta blockers + CAD negative cardio ROS  Rhythm:Regular Rate:Normal     Neuro/Psych negative neurological ROS  negative psych ROS   GI/Hepatic Neg liver ROS, GERD-  Medicated,  Endo/Other  negative endocrine ROS  Renal/GU negative Renal ROS     Musculoskeletal negative musculoskeletal ROS (+)   Abdominal   Peds  Hematology negative hematology ROS (+)   Anesthesia Other Findings   Reproductive/Obstetrics                         Anesthesia Physical Anesthesia Plan  ASA: III  Anesthesia Plan: General   Post-op Pain Management:    Induction: Intravenous  Airway Management Planned: LMA  Additional Equipment:   Intra-op Plan:   Post-operative Plan: Extubation in OR  Informed Consent: I have reviewed the patients History and Physical, chart, labs and discussed the procedure including the risks, benefits and alternatives for the proposed anesthesia with the patient or authorized representative who has indicated his/her understanding and acceptance.   Dental advisory given  Plan Discussed with: CRNA  Anesthesia Plan Comments:         Anesthesia Quick Evaluation

## 2012-10-04 ENCOUNTER — Encounter (HOSPITAL_COMMUNITY): Payer: Self-pay | Admitting: Orthopedic Surgery

## 2012-10-04 NOTE — Anesthesia Postprocedure Evaluation (Signed)
Anesthesia Post Note  Patient: [name] [name]  Procedure(s) Performed: Procedure(s) (LRB): RIGHT KNEEE ARTHROSCOPY WITH DEBRIDEMENT  (Right)  Anesthesia type: General  Patient location: PACU  Post pain: Pain level controlled  Post assessment: Post-op Vital signs reviewed  Last Vitals: BP 145/66  Pulse 64  Temp(Src) 36.4 C (Oral)  Resp 16  SpO2 99%  Post vital signs: Reviewed  Level of consciousness: sedated  Complications: No apparent anesthesia complications

## 2012-10-17 DIAGNOSIS — Z85828 Personal history of other malignant neoplasm of skin: Secondary | ICD-10-CM | POA: Diagnosis not present

## 2012-10-17 DIAGNOSIS — L570 Actinic keratosis: Secondary | ICD-10-CM | POA: Diagnosis not present

## 2012-10-17 DIAGNOSIS — L821 Other seborrheic keratosis: Secondary | ICD-10-CM | POA: Diagnosis not present

## 2012-11-08 DIAGNOSIS — Z9889 Other specified postprocedural states: Secondary | ICD-10-CM | POA: Diagnosis not present

## 2012-11-09 DIAGNOSIS — M25569 Pain in unspecified knee: Secondary | ICD-10-CM | POA: Diagnosis not present

## 2012-11-13 DIAGNOSIS — M25569 Pain in unspecified knee: Secondary | ICD-10-CM | POA: Diagnosis not present

## 2012-11-15 DIAGNOSIS — M25569 Pain in unspecified knee: Secondary | ICD-10-CM | POA: Diagnosis not present

## 2012-11-20 DIAGNOSIS — M25569 Pain in unspecified knee: Secondary | ICD-10-CM | POA: Diagnosis not present

## 2012-11-22 DIAGNOSIS — M25569 Pain in unspecified knee: Secondary | ICD-10-CM | POA: Diagnosis not present

## 2012-11-27 DIAGNOSIS — M25569 Pain in unspecified knee: Secondary | ICD-10-CM | POA: Diagnosis not present

## 2012-11-29 DIAGNOSIS — M25569 Pain in unspecified knee: Secondary | ICD-10-CM | POA: Diagnosis not present

## 2012-11-30 ENCOUNTER — Encounter: Payer: Self-pay | Admitting: Cardiovascular Disease

## 2012-11-30 ENCOUNTER — Ambulatory Visit (INDEPENDENT_AMBULATORY_CARE_PROVIDER_SITE_OTHER): Payer: Medicare Other | Admitting: Cardiovascular Disease

## 2012-11-30 VITALS — BP 128/78 | HR 58 | Ht 70.0 in | Wt 149.0 lb

## 2012-11-30 DIAGNOSIS — I10 Essential (primary) hypertension: Secondary | ICD-10-CM | POA: Diagnosis not present

## 2012-11-30 DIAGNOSIS — E785 Hyperlipidemia, unspecified: Secondary | ICD-10-CM | POA: Diagnosis not present

## 2012-11-30 DIAGNOSIS — I2510 Atherosclerotic heart disease of native coronary artery without angina pectoris: Secondary | ICD-10-CM | POA: Diagnosis not present

## 2012-11-30 MED ORDER — BENAZEPRIL-HYDROCHLOROTHIAZIDE 20-25 MG PO TABS: 1.0000 | ORAL_TABLET | Freq: Every morning | ORAL | Status: DC

## 2012-11-30 MED ORDER — ROSUVASTATIN CALCIUM 20 MG PO TABS: 20.0000 mg | ORAL_TABLET | Freq: Every day | ORAL | Status: DC

## 2012-11-30 MED ORDER — CLOPIDOGREL BISULFATE 75 MG PO TABS: 75.0000 mg | ORAL_TABLET | Freq: Every morning | ORAL | Status: DC

## 2012-11-30 MED ORDER — CARVEDILOL 6.25 MG PO TABS: 3.1250 mg | ORAL_TABLET | Freq: Two times a day (BID) | ORAL | Status: DC

## 2012-11-30 NOTE — Progress Notes (Signed)
Patient ID: [name] [name], male   DOB: 21-Jun-1935, 76 y.o.   MRN: [id number] [name] [name] seen today in followup for his coronary disease. In July 2009 he had an abnormal Myoview and had stenting of the right coronary artery. He is enrolled in the [employer] research trial. His been compliant with his medications. 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 Clent Ridges his primary care doctor. There's been no chest pain palpitations PND or orthopnea.   ROS: Denies fever, malais, weight loss, blurry vision, decreased visual acuity, cough, sputum, SOB, hemoptysis, pleuritic pain, palpitaitons, heartburn, abdominal pain, melena, lower extremity edema, claudication, or rash.  All other systems reviewed and negative  General: Affect appropriate Healthy:  appears stated age HEENT: normal Neck supple with no adenopathy JVP normal no bruits no thyromegaly Lungs clear with no wheezing and good diaphragmatic motion Heart:  S1/S2 no murmur, no rub, gallop or click PMI normal Abdomen: benighn, BS positve, no tenderness, no AAA no bruit.  No HSM or HJR Distal pulses intact with no bruits Trace edema with varicosities Neuro non-focal Skin warm and dry No muscular weakness   Current Outpatient Prescriptions  Medication Sig Dispense Refill  . aspirin 325 MG tablet Take 325 mg by mouth every morning.       . benazepril-hydrochlorthiazide (LOTENSIN HCT) 20-25 MG per tablet Take 1 tablet by mouth every morning.  30 tablet  11  . carvedilol (COREG) 6.25 MG tablet Take 0.5 tablets (3.125 mg total) by mouth 2 (two) times daily with a meal.  30 tablet  11  . clopidogrel (PLAVIX) 75 MG tablet Take 1 tablet (75 mg total) by mouth every morning.  30 tablet  11  . methocarbamol (ROBAXIN) 500 MG tablet Take 1 tablet (500 mg total) by mouth 4 (four) times daily. As needed for  muscle spasm  30 tablet  1  . nitroGLYCERIN (NITROSTAT) 0.4 MG SL tablet Place 0.4 mg under the tongue every 5 (five) minutes as needed for chest pain.      . rosuvastatin (CRESTOR) 20 MG tablet Take 1 tablet (20 mg total) by mouth at bedtime.  30 tablet  11   No current facility-administered medications for this visit.    Allergies  Review of patient's allergies indicates no known allergies.  Electrocardiogram:  09/28/12  SR rate 82 first degree block otherwise normal  Assessment and Plan

## 2012-11-30 NOTE — Assessment & Plan Note (Signed)
Stable with no angina and good activity level.  Continue medical Rx  

## 2012-11-30 NOTE — Assessment & Plan Note (Signed)
Cholesterol is at goal.  Continue current dose of statin and diet Rx.  No myalgias or side effects.  F/U  LFT's in 6 months. Lab Results  Component Value Date   LDLCALC 54 11/30/2009             

## 2012-11-30 NOTE — Patient Instructions (Signed)
Your physician wants you to follow-up in: YEAR WITH DR NISHAN  You will receive a reminder letter in the mail two months in advance. If you don't receive a letter, please call our office to schedule the follow-up appointment.  Your physician recommends that you continue on your current medications as directed. Please refer to the Current Medication list given to you today. 

## 2012-11-30 NOTE — Assessment & Plan Note (Signed)
Well controlled.  Continue current medications and low sodium Dash type diet.    

## 2012-12-04 DIAGNOSIS — M25569 Pain in unspecified knee: Secondary | ICD-10-CM | POA: Diagnosis not present

## 2012-12-06 DIAGNOSIS — M25569 Pain in unspecified knee: Secondary | ICD-10-CM | POA: Diagnosis not present

## 2012-12-11 DIAGNOSIS — M1610 Unilateral primary osteoarthritis, unspecified hip: Secondary | ICD-10-CM | POA: Diagnosis not present

## 2012-12-11 DIAGNOSIS — M169 Osteoarthritis of hip, unspecified: Secondary | ICD-10-CM | POA: Diagnosis not present

## 2012-12-12 ENCOUNTER — Other Ambulatory Visit: Payer: Self-pay | Admitting: Orthopedic Surgery

## 2012-12-12 DIAGNOSIS — M1990 Unspecified osteoarthritis, unspecified site: Secondary | ICD-10-CM

## 2012-12-17 ENCOUNTER — Ambulatory Visit
Admission: RE | Admit: 2012-12-17 | Discharge: 2012-12-17 | Disposition: A | Discharge status: Home / Self Care | Payer: Medicare Other | Source: Ambulatory Visit | Attending: Orthopedic Surgery | Admitting: Orthopedic Surgery

## 2012-12-17 DIAGNOSIS — M169 Osteoarthritis of hip, unspecified: Secondary | ICD-10-CM | POA: Diagnosis not present

## 2012-12-17 DIAGNOSIS — M1990 Unspecified osteoarthritis, unspecified site: Secondary | ICD-10-CM

## 2012-12-17 DIAGNOSIS — M1610 Unilateral primary osteoarthritis, unspecified hip: Secondary | ICD-10-CM | POA: Diagnosis not present

## 2012-12-17 MED ORDER — IOHEXOL 180 MG/ML  SOLN
1.0000 mL | Freq: Once | INTRAMUSCULAR | Status: AC | PRN
  Administered 2012-12-17: 1 mL via INTRA_ARTICULAR

## 2012-12-17 MED ORDER — METHYLPREDNISOLONE ACETATE 40 MG/ML INJ SUSP (RADIOLOG
120.0000 mg | Freq: Once | INTRAMUSCULAR | Status: AC
  Administered 2012-12-17: 120 mg via INTRA_ARTICULAR

## 2013-01-10 DIAGNOSIS — M1610 Unilateral primary osteoarthritis, unspecified hip: Secondary | ICD-10-CM | POA: Diagnosis not present

## 2013-01-10 DIAGNOSIS — M169 Osteoarthritis of hip, unspecified: Secondary | ICD-10-CM | POA: Diagnosis not present

## 2013-02-14 DIAGNOSIS — H2510 Age-related nuclear cataract, unspecified eye: Secondary | ICD-10-CM | POA: Diagnosis not present

## 2013-02-14 DIAGNOSIS — H40059 Ocular hypertension, unspecified eye: Secondary | ICD-10-CM | POA: Diagnosis not present

## 2013-02-14 DIAGNOSIS — H43819 Vitreous degeneration, unspecified eye: Secondary | ICD-10-CM | POA: Diagnosis not present

## 2013-02-15 ENCOUNTER — Ambulatory Visit (INDEPENDENT_AMBULATORY_CARE_PROVIDER_SITE_OTHER): Payer: Medicare Other

## 2013-02-15 DIAGNOSIS — Z23 Encounter for immunization: Secondary | ICD-10-CM

## 2013-02-19 DIAGNOSIS — M25559 Pain in unspecified hip: Secondary | ICD-10-CM | POA: Diagnosis not present

## 2013-02-19 DIAGNOSIS — M169 Osteoarthritis of hip, unspecified: Secondary | ICD-10-CM | POA: Diagnosis not present

## 2013-02-19 DIAGNOSIS — M1610 Unilateral primary osteoarthritis, unspecified hip: Secondary | ICD-10-CM | POA: Diagnosis not present

## 2013-03-21 ENCOUNTER — Encounter (HOSPITAL_COMMUNITY): Payer: Self-pay | Admitting: Pharmacy Technician

## 2013-03-26 ENCOUNTER — Encounter (HOSPITAL_COMMUNITY): Payer: Self-pay

## 2013-03-26 ENCOUNTER — Encounter (HOSPITAL_COMMUNITY)
Admission: RE | Admit: 2013-03-26 | Discharge: 2013-03-26 | Disposition: A | Discharge status: Home / Self Care | Payer: Medicare Other | Source: Ambulatory Visit | Attending: Orthopedic Surgery | Admitting: Orthopedic Surgery

## 2013-03-26 ENCOUNTER — Other Ambulatory Visit: Payer: Self-pay | Admitting: Orthopedic Surgery

## 2013-03-26 ENCOUNTER — Ambulatory Visit (HOSPITAL_COMMUNITY)
Admission: RE | Admit: 2013-03-26 | Discharge: 2013-03-26 | Disposition: A | Discharge status: Home / Self Care | Payer: Medicare Other | Source: Ambulatory Visit | Attending: Orthopedic Surgery | Admitting: Orthopedic Surgery

## 2013-03-26 DIAGNOSIS — M1610 Unilateral primary osteoarthritis, unspecified hip: Secondary | ICD-10-CM | POA: Diagnosis not present

## 2013-03-26 DIAGNOSIS — M169 Osteoarthritis of hip, unspecified: Secondary | ICD-10-CM | POA: Diagnosis not present

## 2013-03-26 HISTORY — DX: Unspecified osteoarthritis, unspecified site: M19.90

## 2013-03-26 HISTORY — DX: Personal history of other diseases of the digestive system: Z87.19

## 2013-03-26 HISTORY — DX: Malignant (primary) neoplasm, unspecified: C80.1

## 2013-03-26 LAB — COMPREHENSIVE METABOLIC PANEL
ALT: 17 U/L (ref 0–53)
AST: 20 U/L (ref 0–37)
Albumin: 4.2 g/dL (ref 3.5–5.2)
Alkaline Phosphatase: 93 U/L (ref 39–117)
BUN: 16 mg/dL (ref 6–23)
CO2: 26 mEq/L (ref 19–32)
Calcium: 9.8 mg/dL (ref 8.4–10.5)
Chloride: 99 mEq/L (ref 96–112)
Creatinine, Ser: 0.84 mg/dL (ref 0.50–1.35)
Potassium: 4 mEq/L (ref 3.5–5.1)
Sodium: 135 mEq/L (ref 135–145)
Total Bilirubin: 0.8 mg/dL (ref 0.3–1.2)
Total Protein: 7.4 g/dL (ref 6.0–8.3)

## 2013-03-26 LAB — ABO/RH: ABO/RH(D): A NEG

## 2013-03-26 LAB — CBC
HCT: 43.9 % (ref 39.0–52.0)
MCHC: 36.4 g/dL — ABNORMAL HIGH (ref 30.0–36.0)
Platelets: 225 10*3/uL (ref 150–400)
RDW: 12.4 % (ref 11.5–15.5)
WBC: 6.9 10*3/uL (ref 4.0–10.5)

## 2013-03-26 LAB — PROTIME-INR: Prothrombin Time: 12.5 seconds (ref 11.6–15.2)

## 2013-03-26 LAB — TYPE AND SCREEN: Antibody Screen: NEGATIVE

## 2013-03-26 LAB — URINALYSIS, ROUTINE W REFLEX MICROSCOPIC
Bilirubin Urine: NEGATIVE
Leukocytes, UA: NEGATIVE
Nitrite: NEGATIVE
Protein, ur: NEGATIVE mg/dL
Specific Gravity, Urine: 1.019 (ref 1.005–1.030)
Urobilinogen, UA: 1 mg/dL (ref 0.0–1.0)
pH: 5.5 (ref 5.0–8.0)

## 2013-03-26 LAB — APTT: aPTT: 26 seconds (ref 24–37)

## 2013-03-26 LAB — SURGICAL PCR SCREEN: Staphylococcus aureus: NEGATIVE

## 2013-03-26 NOTE — Pre-Procedure Instructions (Signed)
Juanmiguel Defelice Lindseth  03/26/2013   Your procedure is scheduled on:  04/05/2013  Report to [facility]  2 * 3   ENTRANCE A  at   1:00pm.  Call this number if you have problems the morning of surgery: [phone]   Remember:   Do not eat food or drink liquids after midnight. On Thursday  Take these medicines the morning of surgery with A SIP OF WATER:   CARVEDILOL   Do not wear jewelry  Do not wear lotions, powders, or perfumes. You may wear deodorant.             Men may shave face and neck.  Do not bring valuables to the hospital.  [facility] is not responsible                  for any belongings or valuables.               Contacts, dentures or bridgework may not be worn into surgery.  Leave suitcase in the car. After surgery it may be brought to your room.  For patients admitted to the hospital, discharge time is determined by your                treatment team.               Patients discharged the day of surgery will not be allowed to drive  home.  Name and phone number of your driver: /w spouse  Special Instructions: Shower using CHG 2 nights before surgery and the night before surgery.  If you shower the day of surgery use CHG.  Use special wash - you have one bottle of CHG for all showers.  You should use approximately 1/3 of the bottle for each shower.   Please read over the following fact sheets that you were given: Pain Booklet, Coughing and Deep Breathing, Blood Transfusion Information, Total Joint Packet, MRSA Information and Surgical Site Infection Prevention

## 2013-03-26 NOTE — Progress Notes (Signed)
Pt. Aware of need to hold aspirin & plavix for 5 days preop.

## 2013-03-26 NOTE — Progress Notes (Signed)
Preoperative surgical orders have been place into the Epic hospital system for [name] [name] on 03/26/2013, 9:53 AM  by Patrica Duel for surgery on 04/05/2013.  Preop Total Hip - Anterior Approach orders including Experel Injecion, PO Tylenol, and IV Decadron as long as there are no contraindications to the above medications. Avel Peace, PA-C

## 2013-03-26 NOTE — H&P (Signed)
TOTAL HIP ADMISSION H&P  Patient is admitted for right total hip arthroplasty.  Subjective:  Chief Complaint: right hip pain  HPI: [name] [name], 77 y.o. male, has a history of pain and functional disability in the right hip(s) due to arthritis and patient has failed non-surgical conservative treatments for greater than 12 weeks to include NSAID's and/or analgesics, corticosteriod injections and activity modification.  Onset of symptoms was gradual starting 2 years ago with gradually worsening course since that time.The patient noted no past surgery on the right hip(s).  Patient currently rates pain in the right hip at 6 out of 10 with activity. Patient has night pain, worsening of pain with activity and weight bearing, pain that interfers with activities of daily living, pain with passive range of motion and crepitus. Patient has evidence of periarticular osteophytes and joint space narrowing by imaging studies. This condition presents safety issues increasing the risk of falls.  There is no current active infection.  Patient Active Problem List   Diagnosis Date Noted  . Lateral meniscal tear 10/03/2012  . CONTACT DERMATITIS 12/12/2008  . APHTHOUS ULCERS 09/22/2008  . BOILS, RECURRENT 02/18/2008  . CORONARY ARTERY DISEASE 12/10/2007  . ACUTE PHARYNGITIS 12/10/2007  . CHEST DISCOMFORT 11/05/2007  . HYPERLIPIDEMIA 02/28/2007  . HYPERTENSION 02/28/2007   Past Medical History  Diagnosis Date  . Abscess of groin, left   . Hx MRSA infection     after heart surgery several yrs ago  . Coronary artery disease   . Hypertension   . Abnormal stress test   . Chest pressure   . Exertional dyspnea   . H/O hiatal hernia   . Gastroesophageal reflux disease with hiatal hernia     "shocsky ring", no longer using Rx  . Cancer     basal cell, history of   . Arthritis     R hip & knees    Past Surgical History  Procedure Laterality Date  . Cardiac catheterization  12/06/07    60%  .  Appendectomy    . Hernia repair    . Knee arthroscopy Right 10/03/2012    Procedure: RIGHT KNEEE ARTHROSCOPY WITH DEBRIDEMENT ;  Surgeon: Loanne Drilling, MD;  Location: [facility];  Service: Orthopedics;  Laterality: Right;  . Yag laser application      for glaucoma - both eyes, no drops as of yet      Current outpatient prescriptions: aspirin 325 MG tablet, Take 325 mg by mouth every morning. , Disp: , Rfl: ;   benazepril-hydrochlorthiazide (LOTENSIN HCT) 20-25 MG per tablet, Take 1 tablet by mouth every morning., Disp: 30 tablet, Rfl: 11;   carvedilol (COREG) 6.25 MG tablet, Take 0.5 tablets (3.125 mg total) by mouth 2 (two) times daily with a meal., Disp: 30 tablet, Rfl: 11 clopidogrel (PLAVIX) 75 MG tablet, Take 1 tablet (75 mg total) by mouth every morning., Disp: 30 tablet, Rfl: 11;   nitroGLYCERIN (NITROSTAT) 0.4 MG SL tablet, Place 0.4 mg under the tongue every 5 (five) minutes as needed for chest pain., Disp: , Rfl: ;   rosuvastatin (CRESTOR) 20 MG tablet, Take 1 tablet (20 mg total) by mouth at bedtime., Disp: 30 tablet, Rfl: 11 ibuprofen (ADVIL,MOTRIN) 200 MG tablet, Take 400 mg by mouth every 6 (six) hours as needed., Disp: , Rfl:   No Known Allergies  History  Substance Use Topics  . Smoking status: Former Smoker    Quit date: 09/28/1961  . Smokeless tobacco: Never Used  . Alcohol  Use: 0.0 oz/week     Comment: wine & beer couple times per week     Family History  Problem Relation Age of Onset  . Stomach cancer      family hx  . Hypertension      family hx  . Heart disease      family hx  . Hypertension Sister   . Hypertension Brother      Review of Systems  Constitutional: Negative.   HENT: Negative.   Eyes: Negative.   Respiratory: Negative.   Cardiovascular: Negative.   Gastrointestinal: Negative.   Genitourinary: Positive for frequency. Negative for dysuria, urgency, hematuria and flank pain.  Musculoskeletal: Positive for joint pain. Negative for back pain,  falls, myalgias and neck pain.  Skin: Positive for rash.  Neurological: Negative.   Endo/Heme/Allergies: Negative.   Psychiatric/Behavioral: Negative.     Objective:  Physical Exam  Constitutional: He is oriented to person, place, and time. He appears well-developed and well-nourished. No distress.  HENT:  Head: Normocephalic and atraumatic.  Right Ear: External ear normal.  Left Ear: External ear normal.  Nose: Nose normal.  Mouth/Throat: Oropharynx is clear and moist.  Eyes: Conjunctivae and EOM are normal.  Neck: Normal range of motion. Neck supple.  Cardiovascular: Normal rate, regular rhythm, normal heart sounds and intact distal pulses.   No murmur heard. Respiratory: Effort normal and breath sounds normal. No respiratory distress. He has no wheezes.  GI: Soft. Bowel sounds are normal. He exhibits no distension. There is no tenderness.  Musculoskeletal:       Right hip: He exhibits decreased range of motion and decreased strength.       Left hip: Normal.       Right knee: Normal.       Left knee: Normal.       Right lower leg: He exhibits no tenderness and no swelling.       Left lower leg: He exhibits no tenderness and no swelling.  His right hip can be flexed to 90, no internal rotation, about 10 external rotation, 10 to 20 of abduction. Right knee no effusion. Slight crepitus on range of motion. No tenderness or instability noted.  Neurological: He is alert and oriented to person, place, and time. He has normal strength and normal reflexes. No sensory deficit.  Skin: No rash noted. He is not diaphoretic. No erythema.  Psychiatric: He has a normal mood and affect. His behavior is normal.    Vital signs in last 24 hours: Temp:  [98 F (36.7 C)] 98 F (36.7 C) (11/11 0957) Pulse Rate:  [69] 69 (11/11 0957) Resp:  [20] 20 (11/11 0957) BP: (116)/(71) 116/71 mmHg (11/11 0957) SpO2:  [95 %] 95 % (11/11 0957) Weight:  [66.996 kg (147 lb 11.2 oz)] 66.996 kg (147 lb 11.2  oz) (11/11 0957)  Labs:   Estimated body mass index is 21.38 kg/(m^2) as calculated from the following:   Height as of 11/30/12: 5\' 10"  (1.778 m).   Weight as of 11/30/12: 67.586 kg (149 lb).   Imaging Review Plain radiographs demonstrate severe degenerative joint disease of the right hip(s). The bone quality appears to be good for age and reported activity level.  Assessment/Plan:  End stage arthritis, right hip(s)  The patient history, physical examination, clinical judgement of the provider and imaging studies are consistent with end stage degenerative joint disease of the right hip(s) and total hip arthroplasty is deemed medically necessary. The treatment options including medical management, injection  therapy, arthroscopy and arthroplasty were discussed at length. The risks and benefits of total hip arthroplasty were presented and reviewed. The risks due to aseptic loosening, infection, stiffness, dislocation/subluxation,  thromboembolic complications and other imponderables were discussed.  The patient acknowledged the explanation, agreed to proceed with the plan and consent was signed. Patient is being admitted for inpatient treatment for surgery, pain control, PT, OT, prophylactic antibiotics, VTE prophylaxis, progressive ambulation and ADL's and discharge planning.The patient is planning to be discharged home with home health services     [place], [place]

## 2013-03-27 NOTE — Progress Notes (Signed)
Anesthesia Chart Review:  Patient is a 77 year old male scheduled for right THA, anterior approach on 04/05/13 by Dr. Despina Hick.  History includes former smoker, CAD s/p RCA stent '09, HTN, GERD, hiatal hernia, arthritis, skin cancer, chronic DOE. PCP is Dr. Clent Ridges.  Cardiologist is Dr. [place] Emms who cleared patient for this procedure.    EKG on 09/28/12 showed SR, first degree AVB, PAC's.  Echo on 11/14/07 showed: - Overall left ventricular systolic function was normal. Left ventricular ejection fraction was estimated to be 65 %. There were no left ventricular regional wall motion abnormalities. - Normal aortic valve - There was mild mitral valvular regurgitation.  He had an abnormal stress test in 2009 which lead to a cardiac cath on 12/06/07 that showed: normal LM, 40-50% bifurcation at the mid LAD and takeoff of D1, 40-50% ostial D1, normal CX, 90% distal RCA, normal PDA and PLA.  He subsequently underwent DES to RCA on 12/07/07.  CXR on 09/28/12 showed: Findings: No infiltrate, congestive heart failure or pneumothorax. Heart size within normal limits. Calcified aortic knob and left carotid bifurcation. No acute abnormality.   Preoperative labs noted.  Patient has been cleared by cardiology.  Labs and CXR are acceptable for OR.  If no acute changes then anticipate that he can proceed.  Velna Ochs [facility]/Anesthesiology Phone [phone] 03/27/2013 6:51 PM

## 2013-04-04 MED ORDER — CEFAZOLIN SODIUM-DEXTROSE 2-3 GM-% IV SOLR
2.0000 g | INTRAVENOUS | Status: AC
  Administered 2013-04-05: 2 g via INTRAVENOUS
  Filled 2013-04-04: qty 50

## 2013-04-05 ENCOUNTER — Encounter (HOSPITAL_COMMUNITY)
Admission: RE | Disposition: A | Discharge status: Home Health | Payer: Self-pay | Source: Ambulatory Visit | Attending: Orthopedic Surgery

## 2013-04-05 ENCOUNTER — Inpatient Hospital Stay (HOSPITAL_COMMUNITY): Payer: Medicare Other

## 2013-04-05 ENCOUNTER — Encounter (HOSPITAL_COMMUNITY): Payer: Medicare Other | Admitting: Vascular Surgery

## 2013-04-05 ENCOUNTER — Inpatient Hospital Stay (HOSPITAL_COMMUNITY)
Admission: RE | Admit: 2013-04-05 | Discharge: 2013-04-07 | DRG: 470 | Disposition: A | Discharge status: Home Health | Payer: Medicare Other | Source: Ambulatory Visit | Attending: Orthopedic Surgery | Admitting: Orthopedic Surgery

## 2013-04-05 ENCOUNTER — Inpatient Hospital Stay (HOSPITAL_COMMUNITY): Payer: Medicare Other | Admitting: Anesthesiology

## 2013-04-05 ENCOUNTER — Encounter (HOSPITAL_COMMUNITY): Payer: Self-pay | Admitting: Family

## 2013-04-05 DIAGNOSIS — Z96649 Presence of unspecified artificial hip joint: Secondary | ICD-10-CM

## 2013-04-05 DIAGNOSIS — Z800 Family history of malignant neoplasm of digestive organs: Secondary | ICD-10-CM | POA: Diagnosis not present

## 2013-04-05 DIAGNOSIS — M1610 Unilateral primary osteoarthritis, unspecified hip: Secondary | ICD-10-CM | POA: Diagnosis not present

## 2013-04-05 DIAGNOSIS — Z8249 Family history of ischemic heart disease and other diseases of the circulatory system: Secondary | ICD-10-CM

## 2013-04-05 DIAGNOSIS — E785 Hyperlipidemia, unspecified: Secondary | ICD-10-CM | POA: Diagnosis present

## 2013-04-05 DIAGNOSIS — K449 Diaphragmatic hernia without obstruction or gangrene: Secondary | ICD-10-CM | POA: Diagnosis present

## 2013-04-05 DIAGNOSIS — M169 Osteoarthritis of hip, unspecified: Secondary | ICD-10-CM | POA: Diagnosis present

## 2013-04-05 DIAGNOSIS — Z87891 Personal history of nicotine dependence: Secondary | ICD-10-CM | POA: Diagnosis not present

## 2013-04-05 DIAGNOSIS — Z471 Aftercare following joint replacement surgery: Secondary | ICD-10-CM | POA: Diagnosis not present

## 2013-04-05 DIAGNOSIS — I10 Essential (primary) hypertension: Secondary | ICD-10-CM | POA: Diagnosis not present

## 2013-04-05 DIAGNOSIS — Z9089 Acquired absence of other organs: Secondary | ICD-10-CM | POA: Diagnosis not present

## 2013-04-05 DIAGNOSIS — I2510 Atherosclerotic heart disease of native coronary artery without angina pectoris: Secondary | ICD-10-CM | POA: Diagnosis not present

## 2013-04-05 DIAGNOSIS — K219 Gastro-esophageal reflux disease without esophagitis: Secondary | ICD-10-CM | POA: Diagnosis present

## 2013-04-05 DIAGNOSIS — M25559 Pain in unspecified hip: Secondary | ICD-10-CM | POA: Diagnosis not present

## 2013-04-05 DIAGNOSIS — Z8614 Personal history of Methicillin resistant Staphylococcus aureus infection: Secondary | ICD-10-CM | POA: Diagnosis not present

## 2013-04-05 HISTORY — PX: TOTAL HIP ARTHROPLASTY: SHX124

## 2013-04-05 SURGERY — ARTHROPLASTY, HIP, TOTAL, ANTERIOR APPROACH
Anesthesia: General | Site: Hip | Laterality: Right | Wound class: Clean

## 2013-04-05 MED ORDER — EPHEDRINE SULFATE 50 MG/ML IJ SOLN
INTRAMUSCULAR | Status: DC | PRN
  Administered 2013-04-05: 5 mg via INTRAVENOUS
  Administered 2013-04-05: 10 mg via INTRAVENOUS

## 2013-04-05 MED ORDER — CEFAZOLIN SODIUM 1-5 GM-% IV SOLN
1.0000 g | Freq: Four times a day (QID) | INTRAVENOUS | Status: AC
  Administered 2013-04-05 – 2013-04-06 (×2): 1 g via INTRAVENOUS
  Filled 2013-04-05 (×2): qty 50

## 2013-04-05 MED ORDER — ACETAMINOPHEN 500 MG PO TABS
1000.0000 mg | ORAL_TABLET | Freq: Four times a day (QID) | ORAL | Status: AC
  Administered 2013-04-05 – 2013-04-06 (×4): 1000 mg via ORAL
  Filled 2013-04-05 (×4): qty 2

## 2013-04-05 MED ORDER — ONDANSETRON HCL 4 MG/2ML IJ SOLN
INTRAMUSCULAR | Status: DC | PRN
  Administered 2013-04-05: 4 mg via INTRAVENOUS

## 2013-04-05 MED ORDER — CHLORHEXIDINE GLUCONATE 4 % EX LIQD: 60.0000 mL | Freq: Once | CUTANEOUS | Status: DC

## 2013-04-05 MED ORDER — ACETAMINOPHEN 10 MG/ML IV SOLN
1000.0000 mg | Freq: Once | INTRAVENOUS | Status: AC
  Administered 2013-04-05: 1000 mg via INTRAVENOUS
  Filled 2013-04-05: qty 100

## 2013-04-05 MED ORDER — GLYCOPYRROLATE 0.2 MG/ML IJ SOLN
INTRAMUSCULAR | Status: DC | PRN
  Administered 2013-04-05: 0.6 mg via INTRAVENOUS

## 2013-04-05 MED ORDER — ONDANSETRON HCL 4 MG/2ML IJ SOLN: 4.0000 mg | Freq: Four times a day (QID) | INTRAMUSCULAR | Status: DC | PRN

## 2013-04-05 MED ORDER — MIDAZOLAM HCL 5 MG/5ML IJ SOLN
INTRAMUSCULAR | Status: DC | PRN
  Administered 2013-04-05 (×2): 1 mg via INTRAVENOUS

## 2013-04-05 MED ORDER — ONDANSETRON HCL 4 MG PO TABS
4.0000 mg | ORAL_TABLET | Freq: Four times a day (QID) | ORAL | Status: DC | PRN
  Administered 2013-04-05: 4 mg via ORAL
  Filled 2013-04-05: qty 1

## 2013-04-05 MED ORDER — ROCURONIUM BROMIDE 100 MG/10ML IV SOLN
INTRAVENOUS | Status: DC | PRN
  Administered 2013-04-05: 50 mg via INTRAVENOUS

## 2013-04-05 MED ORDER — PHENOL 1.4 % MT LIQD: 1.0000 | OROMUCOSAL | Status: DC | PRN

## 2013-04-05 MED ORDER — LIDOCAINE HCL (CARDIAC) 20 MG/ML IV SOLN
INTRAVENOUS | Status: DC | PRN
  Administered 2013-04-05: 100 mg via INTRAVENOUS

## 2013-04-05 MED ORDER — BUPIVACAINE HCL (PF) 0.25 % IJ SOLN
INTRAMUSCULAR | Status: DC | PRN
  Administered 2013-04-05: 20 mL

## 2013-04-05 MED ORDER — DIPHENHYDRAMINE HCL 12.5 MG/5ML PO ELIX: 12.5000 mg | ORAL_SOLUTION | ORAL | Status: DC | PRN

## 2013-04-05 MED ORDER — PROMETHAZINE HCL 25 MG/ML IJ SOLN: 6.2500 mg | INTRAMUSCULAR | Status: DC | PRN

## 2013-04-05 MED ORDER — MENTHOL 3 MG MT LOZG: 1.0000 | LOZENGE | OROMUCOSAL | Status: DC | PRN

## 2013-04-05 MED ORDER — FENTANYL CITRATE 0.05 MG/ML IJ SOLN
INTRAMUSCULAR | Status: DC | PRN
  Administered 2013-04-05 (×2): 50 ug via INTRAVENOUS
  Administered 2013-04-05: 100 ug via INTRAVENOUS
  Administered 2013-04-05: 50 ug via INTRAVENOUS

## 2013-04-05 MED ORDER — NITROGLYCERIN 0.4 MG SL SUBL
0.4000 mg | SUBLINGUAL_TABLET | SUBLINGUAL | Status: DC | PRN
Start: 2013-04-05 — End: 2013-04-07

## 2013-04-05 MED ORDER — NEOSTIGMINE METHYLSULFATE 1 MG/ML IJ SOLN
INTRAMUSCULAR | Status: DC | PRN
  Administered 2013-04-05: 4 mg via INTRAVENOUS

## 2013-04-05 MED ORDER — SODIUM CHLORIDE 0.9 % IJ SOLN
INTRAMUSCULAR | Status: DC | PRN
  Administered 2013-04-05: 30 mL via INTRAVENOUS

## 2013-04-05 MED ORDER — METHOCARBAMOL 500 MG PO TABS
500.0000 mg | ORAL_TABLET | Freq: Four times a day (QID) | ORAL | Status: DC | PRN
  Administered 2013-04-06: 500 mg via ORAL
  Filled 2013-04-05: qty 1

## 2013-04-05 MED ORDER — 0.9 % SODIUM CHLORIDE (POUR BTL) OPTIME
TOPICAL | Status: DC | PRN
  Administered 2013-04-05: 1000 mL

## 2013-04-05 MED ORDER — LACTATED RINGERS IV SOLN
INTRAVENOUS | Status: DC
  Administered 2013-04-05: 13:00:00 via INTRAVENOUS

## 2013-04-05 MED ORDER — OXYCODONE HCL 5 MG PO TABS: 5.0000 mg | ORAL_TABLET | Freq: Once | ORAL | Status: DC | PRN

## 2013-04-05 MED ORDER — MORPHINE SULFATE 2 MG/ML IJ SOLN: 1.0000 mg | INTRAMUSCULAR | Status: DC | PRN

## 2013-04-05 MED ORDER — BISACODYL 10 MG RE SUPP: 10.0000 mg | Freq: Every day | RECTAL | Status: DC | PRN

## 2013-04-05 MED ORDER — SODIUM CHLORIDE 0.9 % IV SOLN: INTRAVENOUS | Status: DC

## 2013-04-05 MED ORDER — DEXTROSE-NACL 5-0.9 % IV SOLN
INTRAVENOUS | Status: DC
  Administered 2013-04-05: via INTRAVENOUS

## 2013-04-05 MED ORDER — PROPOFOL 10 MG/ML IV BOLUS
INTRAVENOUS | Status: DC | PRN
  Administered 2013-04-05: 50 mg via INTRAVENOUS
  Administered 2013-04-05: 150 mg via INTRAVENOUS

## 2013-04-05 MED ORDER — HYDROMORPHONE HCL PF 1 MG/ML IJ SOLN: 0.2500 mg | INTRAMUSCULAR | Status: DC | PRN

## 2013-04-05 MED ORDER — CARVEDILOL 3.125 MG PO TABS
3.1250 mg | ORAL_TABLET | Freq: Two times a day (BID) | ORAL | Status: DC
  Administered 2013-04-06 – 2013-04-07 (×3): 3.125 mg via ORAL
  Filled 2013-04-05 (×5): qty 1

## 2013-04-05 MED ORDER — OXYCODONE HCL 5 MG PO TABS
5.0000 mg | ORAL_TABLET | ORAL | Status: DC | PRN
  Administered 2013-04-05: 5 mg via ORAL
  Administered 2013-04-06 (×2): 10 mg via ORAL
  Filled 2013-04-05: qty 1
  Filled 2013-04-05 (×2): qty 2

## 2013-04-05 MED ORDER — ACETAMINOPHEN 650 MG RE SUPP: 650.0000 mg | Freq: Four times a day (QID) | RECTAL | Status: DC | PRN

## 2013-04-05 MED ORDER — RIVAROXABAN 10 MG PO TABS
10.0000 mg | ORAL_TABLET | Freq: Every day | ORAL | Status: DC
  Administered 2013-04-06 – 2013-04-07 (×2): 10 mg via ORAL
  Filled 2013-04-05 (×3): qty 1

## 2013-04-05 MED ORDER — TRAMADOL HCL 50 MG PO TABS: 50.0000 mg | ORAL_TABLET | Freq: Four times a day (QID) | ORAL | Status: DC | PRN

## 2013-04-05 MED ORDER — DEXAMETHASONE SODIUM PHOSPHATE 10 MG/ML IJ SOLN
10.0000 mg | Freq: Once | INTRAMUSCULAR | Status: AC
  Administered 2013-04-05: 10 mg via INTRAVENOUS

## 2013-04-05 MED ORDER — DEXAMETHASONE 6 MG PO TABS
10.0000 mg | ORAL_TABLET | Freq: Every day | ORAL | Status: AC
  Administered 2013-04-06: 10 mg via ORAL
  Filled 2013-04-05: qty 1

## 2013-04-05 MED ORDER — BUPIVACAINE LIPOSOME 1.3 % IJ SUSP
20.0000 mL | Freq: Once | INTRAMUSCULAR | Status: DC
  Filled 2013-04-05: qty 20

## 2013-04-05 MED ORDER — OXYCODONE HCL 5 MG/5ML PO SOLN: 5.0000 mg | Freq: Once | ORAL | Status: DC | PRN

## 2013-04-05 MED ORDER — METOCLOPRAMIDE HCL 10 MG PO TABS
5.0000 mg | ORAL_TABLET | Freq: Three times a day (TID) | ORAL | Status: DC | PRN
  Administered 2013-04-06: 10 mg via ORAL
  Filled 2013-04-05: qty 1

## 2013-04-05 MED ORDER — FLEET ENEMA 7-19 GM/118ML RE ENEM: 1.0000 | ENEMA | Freq: Once | RECTAL | Status: AC | PRN

## 2013-04-05 MED ORDER — ACETAMINOPHEN 325 MG PO TABS: 650.0000 mg | ORAL_TABLET | Freq: Four times a day (QID) | ORAL | Status: DC | PRN

## 2013-04-05 MED ORDER — ALBUMIN HUMAN 5 % IV SOLN
INTRAVENOUS | Status: DC | PRN
  Administered 2013-04-05: 16:00:00 via INTRAVENOUS

## 2013-04-05 MED ORDER — LACTATED RINGERS IV SOLN
INTRAVENOUS | Status: DC | PRN
  Administered 2013-04-05 (×3): via INTRAVENOUS

## 2013-04-05 MED ORDER — BUPIVACAINE LIPOSOME 1.3 % IJ SUSP
INTRAMUSCULAR | Status: DC | PRN
  Administered 2013-04-05: 20 mL

## 2013-04-05 MED ORDER — DEXAMETHASONE SODIUM PHOSPHATE 10 MG/ML IJ SOLN
INTRAMUSCULAR | Status: AC
  Filled 2013-04-05: qty 1

## 2013-04-05 MED ORDER — DOCUSATE SODIUM 100 MG PO CAPS
100.0000 mg | ORAL_CAPSULE | Freq: Two times a day (BID) | ORAL | Status: DC
  Administered 2013-04-05 – 2013-04-07 (×4): 100 mg via ORAL
  Filled 2013-04-05 (×6): qty 1

## 2013-04-05 MED ORDER — ARTIFICIAL TEARS OP OINT
TOPICAL_OINTMENT | OPHTHALMIC | Status: DC | PRN
  Administered 2013-04-05: 1 via OPHTHALMIC

## 2013-04-05 MED ORDER — ASPIRIN 325 MG PO TABS
325.0000 mg | ORAL_TABLET | Freq: Every morning | ORAL | Status: DC
  Administered 2013-04-06 – 2013-04-07 (×2): 325 mg via ORAL
  Filled 2013-04-05 (×2): qty 1

## 2013-04-05 MED ORDER — METHOCARBAMOL 100 MG/ML IJ SOLN
500.0000 mg | Freq: Four times a day (QID) | INTRAVENOUS | Status: DC | PRN
  Filled 2013-04-05: qty 5

## 2013-04-05 MED ORDER — POLYETHYLENE GLYCOL 3350 17 G PO PACK: 17.0000 g | PACK | Freq: Every day | ORAL | Status: DC | PRN

## 2013-04-05 MED ORDER — METOCLOPRAMIDE HCL 5 MG/ML IJ SOLN: 5.0000 mg | Freq: Three times a day (TID) | INTRAMUSCULAR | Status: DC | PRN

## 2013-04-05 MED ORDER — DEXAMETHASONE SODIUM PHOSPHATE 10 MG/ML IJ SOLN
10.0000 mg | Freq: Every day | INTRAMUSCULAR | Status: AC
  Filled 2013-04-05: qty 1

## 2013-04-05 SURGICAL SUPPLY — 43 items
BLADE SAW SGTL 18X1.27X75 (BLADE) ×2 IMPLANT
CAPT HIP PF COP ×2 IMPLANT
CLOTH BEACON ORANGE TIMEOUT ST (SAFETY) IMPLANT
CLSR STERI-STRIP ANTIMIC 1/2X4 (GAUZE/BANDAGES/DRESSINGS) ×2 IMPLANT
DECANTER SPIKE VIAL GLASS SM (MISCELLANEOUS) IMPLANT
DRAPE C-ARM 42X72 X-RAY (DRAPES) ×2 IMPLANT
DRAPE STERI IOBAN 125X83 (DRAPES) ×2 IMPLANT
DRAPE U-SHAPE 47X51 STRL (DRAPES) ×6 IMPLANT
DRSG ADAPTIC 3X8 NADH LF (GAUZE/BANDAGES/DRESSINGS) ×2 IMPLANT
DRSG MEPILEX BORDER 4X4 (GAUZE/BANDAGES/DRESSINGS) ×2 IMPLANT
DRSG MEPILEX BORDER 4X8 (GAUZE/BANDAGES/DRESSINGS) ×2 IMPLANT
DURAPREP 26ML APPLICATOR (WOUND CARE) ×2 IMPLANT
ELECT BLADE 6.5 EXT (BLADE) ×2 IMPLANT
ELECT REM PT RETURN 9FT ADLT (ELECTROSURGICAL) ×2
ELECTRODE REM PT RTRN 9FT ADLT (ELECTROSURGICAL) ×1 IMPLANT
EVACUATOR 1/8 PVC DRAIN (DRAIN) ×2 IMPLANT
FACESHIELD LNG OPTICON STERILE (SAFETY) ×6 IMPLANT
GLOVE BIO SURGEON STRL SZ8 (GLOVE) ×4 IMPLANT
GLOVE BIOGEL PI IND STRL 8 (GLOVE) ×1 IMPLANT
GLOVE BIOGEL PI INDICATOR 8 (GLOVE) ×1
GLOVE ECLIPSE 8.0 STRL XLNG CF (GLOVE) ×2 IMPLANT
GOWN STRL NON-REIN LRG LVL3 (GOWN DISPOSABLE) ×2 IMPLANT
GOWN STRL REIN XL XLG (GOWN DISPOSABLE) ×2 IMPLANT
KIT BASIN OR (CUSTOM PROCEDURE TRAY) ×2 IMPLANT
NDL SAFETY ECLIPSE 18X1.5 (NEEDLE) ×1 IMPLANT
NEEDLE 18GX1X1/2 (RX/OR ONLY) (NEEDLE) ×2 IMPLANT
NEEDLE 22X1 1/2 (OR ONLY) (NEEDLE) ×2 IMPLANT
NEEDLE HYPO 18GX1.5 SHARP (NEEDLE) ×1
PACK TOTAL JOINT (CUSTOM PROCEDURE TRAY) ×2 IMPLANT
PADDING CAST COTTON 6X4 STRL (CAST SUPPLIES) ×2 IMPLANT
SPONGE GAUZE 4X4 12PLY (GAUZE/BANDAGES/DRESSINGS) ×2 IMPLANT
SUCTION FRAZIER TIP 10 FR DISP (SUCTIONS) ×2 IMPLANT
SUT ETHIBOND NAB CT1 #1 30IN (SUTURE) ×2 IMPLANT
SUT MNCRL AB 4-0 PS2 18 (SUTURE) ×2 IMPLANT
SUT VIC AB 1 CT1 27 (SUTURE) ×1
SUT VIC AB 1 CT1 27XBRD ANTBC (SUTURE) ×1 IMPLANT
SUT VIC AB 2-0 CT1 27 (SUTURE) ×2
SUT VIC AB 2-0 CT1 TAPERPNT 27 (SUTURE) ×2 IMPLANT
SUT VLOC 180 0 24IN GS25 (SUTURE) ×2 IMPLANT
SYR 20CC LL (SYRINGE) ×2 IMPLANT
SYR 50ML LL SCALE MARK (SYRINGE) ×2 IMPLANT
TOWEL OR 17X26 10 PK STRL BLUE (TOWEL DISPOSABLE) ×4 IMPLANT
TRAY FOLEY CATH 14FRSI W/METER (CATHETERS) ×2 IMPLANT

## 2013-04-05 NOTE — Plan of Care (Signed)
Problem: Consults Goal: Diagnosis- Total Joint Replacement Primary Total Hip Right     

## 2013-04-05 NOTE — Op Note (Signed)
OPERATIVE REPORT  PREOPERATIVE DIAGNOSIS: Osteoarthritis of the Right hip.   POSTOPERATIVE DIAGNOSIS: Osteoarthritis of the Right  hip.   PROCEDURE: Right total hip arthroplasty, anterior approach.   SURGEON: Ollen Gross, MD   ASSISTANT: Avel Peace, PA-C  ANESTHESIA:  General  ESTIMATED BLOOD LOSS:- 600 ml  DRAINS: Hemovac x1.   COMPLICATIONS: None   CONDITION: PACU - hemodynamically stable.   BRIEF CLINICAL NOTE: [name] [name] is a 77 y.o. male who has advanced end-  stage arthritis of his Right  hip with progressively worsening pain and  dysfunction.The patient has failed nonoperative management and presents for  total hip arthroplasty.   PROCEDURE IN DETAIL: After successful administration of spinal  anesthetic, the traction boots for the [facility] bed were placed on both  feet and the patient was placed onto the [facility] bed, boots placed into the leg  holders. The Right hip was then isolated from the perineum with plastic  drapes and prepped and draped in the usual sterile fashion. ASIS and  greater trochanter were marked and a oblique incision was made, starting  at about 1 cm lateral and 2 cm distal to the ASIS and coursing towards  the anterior cortex of the femur. The skin was cut with a 10 blade  through subcutaneous tissue to the level of the fascia overlying the  tensor fascia lata muscle. The fascia was then incised in line with the  incision at the junction of the anterior third and posterior 2/3rd. The  muscle was teased off the fascia and then the interval between the TFL  and the rectus was developed. The Hohmann retractor was then placed at  the top of the femoral neck over the capsule. The vessels overlying the  capsule were cauterized and the fat on top of the capsule was removed.  A Hohmann retractor was then placed anterior underneath the rectus  femoris to give exposure to the entire anterior capsule. A T-shaped  capsulotomy was performed.  The edges were tagged and the femoral head  was identified.       Osteophytes are removed off the superior acetabulum.  The femoral neck was then cut in situ with an oscillating saw. Traction  was then applied to the left lower extremity utilizing the [facility]  traction. The femoral head was then removed. Retractors were placed  around the acetabulum and then circumferential removal of the labrum was  performed. Osteophytes were also removed. Reaming starts at 47 mm to  medialize and  Increased in 2 mm increments to 51 mm. We reamed in  approximately 40 degrees of abduction, 20 degrees anteversion. A 52 mm  pinnacle acetabular shell was then impacted in anatomic position under  fluoroscopic guidance with excellent purchase. We did not need to place  any additional dome screws. A 32 mm neutral + 4 marathon liner was then  placed into the acetabular shell.       The femoral lift was then placed along the lateral aspect of the femur  just distal to the vastus ridge. The leg was  externally rotated and capsule  was stripped off the inferior aspect of the femoral neck down to the  level of the lesser trochanter, this was done with electrocautery. The femur was lifted after this was performed. The  leg was then placed and extended in adducted position to essentially delivering the femur. We also removed the capsule superiorly and the  piriformis from the piriformis  fossa to gain excellent exposure of the  proximal femur. Rongeur was used to remove some cancellous bone to get  into the lateral portion of the proximal femur for placement of the  initial starter reamer. The starter broaches was placed  the starter broach  and was shown to go down the center of the canal. Broaching  with the  Corail system was then performed starting at size 8, coursing  Up to size 10. A size 10 had excellent torsional and rotational  and axial stability. The trial high offset neck was then placed  with a 32 + 1 trial  head. The hip was then reduced. We confirmed that  the stem was in the canal both on AP and lateral x-rays. It also has excellent sizing. The hip was reduced with outstanding stability through full extension, full external rotation,  and then flexion in adduction internal rotation. AP pelvis was taken  and the leg lengths were measured and found to be exactly equal. Hip  was then dislocated again and the femoral head and neck removed. The  femoral broach was removed. Size 10 Corail stem with a high offset  neck was then impacted into the femur following native anteversion. Has  excellent purchase in the canal. Excellent torsional and rotational and  axial stability. It is confirmed to be in the canal on AP and lateral  fluoroscopic views. The 32 + 1 metal head was placed and the hip  reduced with outstanding stability. Again AP pelvis was taken and it  confirmed that the leg lengths were equal. The wound was then copiously  irrigated with saline solution and the capsule reattached and repaired  with Ethibond suture.  20 mL of Exparel mixed with 50 mL of saline then additional 20 ml of .25% Bupivicaine injected into the capsule and into the edge of the tensor fascia lata as well as subcutaneous tissue. The fascia overlying the tensor fascia lata was  then closed with a running #1 V-Loc. Subcu was closed with interrupted  2-0 Vicryl and subcuticular running 4-0 Monocryl. Incision was cleaned  and dried. Steri-Strips and a bulky sterile dressing applied. Hemovac  drain was hooked to suction and then he was awakened and transported to  recovery in stable condition.        Please note that a surgical assistant was a medical necessity for this procedure to perform it in a safe and expeditious manner. Assistant was necessary to provide appropriate retraction of vital neurovascular structures and to prevent femoral fracture and allow for anatomic placement of the prosthesis.  Ollen Gross, M.D.

## 2013-04-05 NOTE — Interval H&P Note (Signed)
History and Physical Interval Note:  04/05/2013 2:54 PM  [name] [name]  has presented today for surgery, with the diagnosis of OA RIGHT HIP  The various methods of treatment have been discussed with the patient and family. After consideration of risks, benefits and other options for treatment, the patient has consented to  Procedure(s): RIGHT TOTAL HIP ARTHROPLASTY ANTERIOR APPROACH (Right) as a surgical intervention .  The patient's history has been reviewed, patient examined, no change in status, stable for surgery.  I have reviewed the patient's chart and labs.  Questions were answered to the patient's satisfaction.     Loanne Drilling

## 2013-04-05 NOTE — Progress Notes (Signed)
Orthopedic Tech Progress Note Patient Details:  [name] [name] 07-Jun-1935 [id number]  Ortho Devices Ortho Device/Splint Location: put ohf on bed   Jennye Moccasin 04/05/2013, 8:45 PM

## 2013-04-05 NOTE — Transfer of Care (Signed)
Immediate Anesthesia Transfer of Care Note  Patient: [name] [name]  Procedure(s) Performed: Procedure(s): RIGHT TOTAL HIP ARTHROPLASTY ANTERIOR APPROACH (Right)  Patient Location: PACU  Anesthesia Type:General  Level of Consciousness: awake, alert , oriented and patient cooperative  Airway & Oxygen Therapy: Patient Spontanous Breathing and Patient connected to nasal cannula oxygen  Post-op Assessment: Report given to PACU RN, Post -op Vital signs reviewed and stable and Patient moving all extremities  Post vital signs: Reviewed and stable  Complications: No apparent anesthesia complications

## 2013-04-05 NOTE — Anesthesia Postprocedure Evaluation (Signed)
Anesthesia Post Note  Patient: [name] [name]  Procedure(s) Performed: Procedure(s) (LRB): RIGHT TOTAL HIP ARTHROPLASTY ANTERIOR APPROACH (Right)  Anesthesia type: General  Patient location: PACU  Post pain: Pain level controlled and Adequate analgesia  Post assessment: Post-op Vital signs reviewed, Patient's Cardiovascular Status Stable, Respiratory Function Stable, Patent Airway and Pain level controlled  Last Vitals:  Filed Vitals:   04/05/13 1806  BP: 145/77  Pulse: 65  Temp: 35.3 C  Resp: 21    Post vital signs: Reviewed and stable  Level of consciousness: awake, alert  and oriented  Complications: No apparent anesthesia complications

## 2013-04-05 NOTE — Anesthesia Procedure Notes (Signed)
Procedure Name: Intubation Date/Time: 04/05/2013 3:49 PM Performed by: Loanne Drilling Pre-anesthesia Checklist: Patient identified, Emergency Drugs available, Suction available, Patient being monitored and Timeout performed Patient Re-evaluated:Patient Re-evaluated prior to inductionOxygen Delivery Method: Circle system utilized Preoxygenation: Pre-oxygenation with 100% oxygen Intubation Type: IV induction Ventilation: Mask ventilation without difficulty Laryngoscope Size: Miller and 3 Grade View: Grade I Tube type: Oral Number of attempts: 1 Airway Equipment and Method: Stylet Placement Confirmation: ETT inserted through vocal cords under direct vision,  positive ETCO2 and breath sounds checked- equal and bilateral Secured at: 23 cm Dental Injury: Teeth and Oropharynx as per pre-operative assessment

## 2013-04-05 NOTE — Preoperative (Signed)
Beta Blockers   Reason not to administer Beta Blockers:Not Applicable 

## 2013-04-05 NOTE — Anesthesia Preprocedure Evaluation (Signed)
Anesthesia Evaluation  Patient identified by MRN, date of birth, ID band Patient awake    Reviewed: Allergy & Precautions, H&P , NPO status , Patient's Chart, lab work & pertinent test results  History of Anesthesia Complications (+) PONV  Airway Mallampati: I  Neck ROM: Full    Dental  (+) Teeth Intact   Pulmonary former smoker,  breath sounds clear to auscultation        Cardiovascular hypertension, + CAD and + Cardiac Stents Rhythm:Regular Rate:Normal     Neuro/Psych    GI/Hepatic hiatal hernia, GERD-  ,  Endo/Other    Renal/GU      Musculoskeletal   Abdominal   Peds  Hematology   Anesthesia Other Findings   Reproductive/Obstetrics                           Anesthesia Physical Anesthesia Plan  ASA: III  Anesthesia Plan: General   Post-op Pain Management:    Induction:   Airway Management Planned: Oral ETT  Additional Equipment:   Intra-op Plan:   Post-operative Plan: Extubation in OR  Informed Consent: I have reviewed the patients History and Physical, chart, labs and discussed the procedure including the risks, benefits and alternatives for the proposed anesthesia with the patient or authorized representative who has indicated his/her understanding and acceptance.     Plan Discussed with: CRNA and Surgeon  Anesthesia Plan Comments:         Anesthesia Quick Evaluation

## 2013-04-06 LAB — BASIC METABOLIC PANEL
BUN: 14 mg/dL (ref 6–23)
Calcium: 8.4 mg/dL (ref 8.4–10.5)
Chloride: 99 mEq/L (ref 96–112)
Creatinine, Ser: 0.69 mg/dL (ref 0.50–1.35)
GFR calc Af Amer: >90 mL/min (ref >90)
GFR calc non Af Amer: >90 mL/min (ref >90)

## 2013-04-06 LAB — CBC
MCH: 34 pg (ref 26.0–34.0)
MCHC: 36.2 g/dL — ABNORMAL HIGH (ref 30.0–36.0)
MCV: 93.8 fL (ref 78.0–100.0)
Platelets: 205 10*3/uL (ref 150–400)
RBC: 3.68 MIL/uL — ABNORMAL LOW (ref 4.22–5.81)
RDW: 12.6 % (ref 11.5–15.5)
WBC: 11.1 10*3/uL — ABNORMAL HIGH (ref 4.0–10.5)

## 2013-04-06 MED ORDER — RIVAROXABAN 10 MG PO TABS: 10.0000 mg | ORAL_TABLET | Freq: Every day | ORAL | Status: DC

## 2013-04-06 MED ORDER — OXYCODONE HCL 5 MG PO TABS: 5.0000 mg | ORAL_TABLET | ORAL | Status: DC | PRN

## 2013-04-06 MED ORDER — TRAMADOL HCL 50 MG PO TABS: 50.0000 mg | ORAL_TABLET | Freq: Four times a day (QID) | ORAL | Status: DC | PRN

## 2013-04-06 MED ORDER — METHOCARBAMOL 500 MG PO TABS: 500.0000 mg | ORAL_TABLET | Freq: Four times a day (QID) | ORAL | Status: DC | PRN

## 2013-04-06 NOTE — Plan of Care (Signed)
Problem: Consults Goal: Diagnosis- Total Joint Replacement Outcome: Completed/Met Date Met:  04/06/13 Primary Total Hip

## 2013-04-06 NOTE — Care Management Note (Unsigned)
    Page 1 of 1   04/06/2013     9:33:45 AM   CARE MANAGEMENT NOTE 04/06/2013  Patient:  [name] [name], [name] [name]   Account Number:  [id number]  Date Initiated:  04/06/2013  Documentation initiated by:  Letha Cape  Subjective/Objective Assessment:   dx s/p r total hip 11/21  admit- lives with spouse.     Action/Plan:   pt eval- recs hhpt   Anticipated DC Date:  04/08/2013   Anticipated DC Plan:  HOME W HOME HEALTH SERVICES      DC Planning Services  CM consult      [facility] Choice  HOME HEALTH   Choice offered to / List presented to:          [facility] arranged  HH-2 PT      [facility] agency  [facility]   Status of service:  In process, will continue to follow Medicare Important Message given?   (If response is "NO", the following Medicare IM given date fields will be blank) Date Medicare IM given:   Date Additional Medicare IM given:    Discharge Disposition:    Per UR Regulation:    If discussed at Long Length of Stay Meetings, dates discussed:    Comments:  04/06/13 9:12 Letha Cape RN, BSN [phone] patient has been preoperatively set up with [facility] for HHPT, and [facility] will deliver DME. NCM will continue to follow for dc needs.

## 2013-04-06 NOTE — Progress Notes (Signed)
Subjective: 1 Day Post-Op Procedure(s) (LRB): RIGHT TOTAL HIP ARTHROPLASTY ANTERIOR APPROACH (Right) Patient reports pain as 2 on 0-10 scale. Hbg11.5 Walking halls .Hemovac Dcd.  Objective: Vital signs in last 24 hours: Temp:  [95.6 F (35.3 C)-98 F (36.7 C)] 98 F (36.7 C) (11/22 0554) Pulse Rate:  [47-80] 77 (11/22 0554) Resp:  [13-25] 18 (11/22 0554) BP: (102-159)/(65-85) 124/67 mmHg (11/22 0554) SpO2:  [96 %-100 %] 100 % (11/22 0554)  Intake/Output from previous day: 11/21 0701 - 11/22 0700 In: 2773.8 [I.V.:2473.8; IV Piggyback:300] Out: 1205 [Urine:500; Drains:330; Blood:375] Intake/Output this shift:     Recent Labs  04/06/13 0325  HGB 12.5*    Recent Labs  04/06/13 0325  WBC 11.1*  RBC 3.68*  HCT 34.5*  PLT 205    Recent Labs  04/06/13 0325  NA 133*  K 3.4*  CL 99  CO2 23  BUN 14  CREATININE 0.69  GLUCOSE 187*  CALCIUM 8.4   No results found for this basename: LABPT, INR,  in the last 72 hours  Dorsiflexion/Plantar flexion intact  Assessment/Plan: 1 Day Post-Op Procedure(s) (LRB): RIGHT TOTAL HIP ARTHROPLASTY ANTERIOR APPROACH (Right) Up with therapy  [name] [name] A 04/06/2013, 8:05 AM

## 2013-04-06 NOTE — Evaluation (Signed)
Physical Therapy Evaluation Patient Details Name: [name] [name] MRN: [id number] DOB: September 12, 1935 Today's Date: 04/06/2013 Time: 4098-1191 PT Time Calculation (min): 30 min  PT Assessment / Plan / Recommendation History of Present Illness  Pt s/p R anterior direct THA.  Clinical Impression  Pt mobilizing well. Spoke extensively regarding how to transfer in/out of bed. Pt anxious re: "i just don't want to hurt it" Anticipate pt to be safe to d/c home with spouse, use of RW, and HHPT.    PT Assessment  Patient needs continued PT services    Follow Up Recommendations  Home health PT;Supervision/Assistance - 24 hour    Does the patient have the potential to tolerate intense rehabilitation      Barriers to Discharge        Equipment Recommendations  None recommended by PT    Recommendations for Other Services     Frequency 7X/week    Precautions / Restrictions Precautions Precautions: None Restrictions Weight Bearing Restrictions: Yes RLE Weight Bearing: Weight bearing as tolerated   Pertinent Vitals/Pain 5/10 R hip      Mobility  Bed Mobility Bed Mobility: Supine to Sit;Sit to Supine Supine to Sit: 5: Supervision;HOB flat Sit to Supine: 5: Supervision;HOB flat Details for Bed Mobility Assistance: pt with safe technique Transfers Transfers: Sit to Stand;Stand to Sit Sit to Stand: 4: Min guard;With upper extremity assist;From bed Stand to Sit: 4: Min guard;With upper extremity assist;To chair/3-in-1 Details for Transfer Assistance: v/c's for safe hand placement and technique, pt with good return demonstration Ambulation/Gait Ambulation/Gait Assistance: 4: Min guard Ambulation Distance (Feet): 120 Feet Assistive device: Rolling walker Ambulation/Gait Assistance Details: v/c's for sequencing intitially. pt transitioned to reciprocal gait pattern toward end of amb. pt with narrow base of support Gait Pattern: Step-through pattern;Decreased stride  length;Antalgic;Narrow base of support;Decreased stance time - right Gait velocity: wfl Stairs: No    Exercises Total Joint Exercises Ankle Circles/Pumps: AROM;Both;10 reps;Seated Quad Sets:  (demo'd) Gluteal Sets: AROM;Both;10 reps;Seated Heel Slides:  (discussed on hand out) Long Arc Quad: AROM;Right;10 reps;Seated   PT Diagnosis: Difficulty walking;Generalized weakness;Acute pain  PT Problem List: Decreased strength;Decreased activity tolerance;Decreased balance;Decreased mobility;Decreased range of motion PT Treatment Interventions: DME instruction;Gait training;Stair training;Functional mobility training;Therapeutic activities;Therapeutic exercise     PT Goals(Current goals can be found in the care plan section) Acute Rehab PT Goals Patient Stated Goal: home PT Goal Formulation: With patient Time For Goal Achievement: 04/13/13 Potential to Achieve Goals: Good  Visit Information  Last PT Received On: 04/06/13 Assistance Needed: +1 History of Present Illness: Pt s/p R anterior direct THA.       Prior Functioning  Home Living Family/patient expects to be discharged to:: Private residence Living Arrangements: Spouse/significant other Available Help at Discharge: Family;Available 24 hours/day Type of Home: House Home Access: Stairs to enter [employer] of Steps: 2 Entrance Stairs-Rails: None Home Layout: Two level;Able to live on main level with bedroom/bathroom Home Equipment: Dan Humphreys - 2 wheels;Walker - 4 wheels;Shower seat;Shower seat - built in;Bedside commode Prior Function Level of Independence: Independent Communication Communication: No difficulties Dominant Hand: Right    Cognition  Cognition Arousal/Alertness: Awake/alert Behavior During Therapy: WFL for tasks assessed/performed Overall Cognitive Status: Within Functional Limits for tasks assessed    Extremity/Trunk Assessment Upper Extremity Assessment Upper Extremity Assessment: Overall WFL  for tasks assessed Lower Extremity Assessment Lower Extremity Assessment: RLE deficits/detail RLE Deficits / Details: knee/ankle WFL, pt able to achieve 60 deg R hip flex Cervical / Trunk Assessment Cervical / Trunk  Assessment: Normal   Balance    End of Session PT - End of Session Equipment Utilized During Treatment: Gait belt Activity Tolerance: Patient tolerated treatment well Patient left: in chair;with call bell/phone within reach Nurse Communication: Mobility status  GP     Marcene Brawn 04/06/2013, 8:27 AM  Lewis Shock, PT, DPT Pager #: [phone] Office #: [phone]

## 2013-04-07 LAB — BASIC METABOLIC PANEL
BUN: 12 mg/dL (ref 6–23)
CO2: 26 mEq/L (ref 19–32)
Calcium: 8.5 mg/dL (ref 8.4–10.5)
Chloride: 104 mEq/L (ref 96–112)
Creatinine, Ser: 0.65 mg/dL (ref 0.50–1.35)
Glucose, Bld: 129 mg/dL — ABNORMAL HIGH (ref 70–99)

## 2013-04-07 LAB — CBC
HCT: 31.8 % — ABNORMAL LOW (ref 39.0–52.0)
MCV: 94.9 fL (ref 78.0–100.0)
Platelets: 199 10*3/uL (ref 150–400)
RBC: 3.35 MIL/uL — ABNORMAL LOW (ref 4.22–5.81)
WBC: 12.9 10*3/uL — ABNORMAL HIGH (ref 4.0–10.5)

## 2013-04-07 MED ORDER — METHOCARBAMOL 500 MG PO TABS: 500.0000 mg | ORAL_TABLET | Freq: Four times a day (QID) | ORAL | Status: DC | PRN

## 2013-04-07 MED ORDER — RIVAROXABAN 10 MG PO TABS: 10.0000 mg | ORAL_TABLET | Freq: Every day | ORAL | Status: DC

## 2013-04-07 MED ORDER — OXYCODONE HCL 5 MG PO TABS: 5.0000 mg | ORAL_TABLET | ORAL | Status: DC | PRN

## 2013-04-07 MED ORDER — TRAMADOL HCL 50 MG PO TABS: 50.0000 mg | ORAL_TABLET | Freq: Four times a day (QID) | ORAL | Status: DC | PRN

## 2013-04-07 NOTE — Progress Notes (Signed)
   CARE MANAGEMENT NOTE 04/07/2013  Patient:  [name] [name], [name] [name]   Account Number:  [id number]  Date Initiated:  04/06/2013  Documentation initiated by:  Letha Cape  Subjective/Objective Assessment:   dx s/p r total hip 11/21  admit- lives with spouse.     Action/Plan:   pt eval- recs hhpt   Anticipated DC Date:  04/08/2013   Anticipated DC Plan:  HOME W HOME HEALTH SERVICES      DC Planning Services  CM consult      [facility] Choice  HOME HEALTH   Choice offered to / List presented to:          [facility] arranged  HH-2 PT      [facility] agency  [facility]   Status of service:  Completed, signed off Medicare Important Message given?   (If response is "NO", the following Medicare IM given date fields will be blank) Date Medicare IM given:   Date Additional Medicare IM given:    Discharge Disposition:  HOME W HOME HEALTH SERVICES  Per UR Regulation:    If discussed at Long Length of Stay Meetings, dates discussed:    Comments:  04/07/13 12:45 CM spoke with [facility] (on campus) veryifying discharge today.  No other CM needs were communicated. Freddy Jaksch, BSN, [place] [phone].  04/06/13 9:12 Letha Cape RN, BSN [phone] patient has been preoperatively set up with [facility] for HHPT, and [facility] will deliver DME. NCM will continue to follow for dc needs.

## 2013-04-07 NOTE — Progress Notes (Signed)
Physical Therapy Treatment Patient Details Name: [name] [name] MRN: [id number] DOB: 10-15-1935 Today's Date: 04/07/2013 Time: 7829-5621 PT Time Calculation (min): 27 min  PT Assessment / Plan / Recommendation  History of Present Illness Pt s/p R anterior direct THA.   PT Comments   Stair training complete; Pt and wife's questions answered; OK for dc home from PT standpoint  Follow Up Recommendations  Home health PT;Supervision/Assistance - 24 hour     Does the patient have the potential to tolerate intense rehabilitation     Barriers to Discharge        Equipment Recommendations  None recommended by PT    Recommendations for Other Services    Frequency 7X/week   Progress towards PT Goals Progress towards PT goals: Progressing toward goals  Plan Current plan remains appropriate    Precautions / Restrictions Precautions Precautions: None Restrictions Weight Bearing Restrictions: Yes RLE Weight Bearing: Weight bearing as tolerated   Pertinent Vitals/Pain 4-5/10 initially R hip; Pt reports soreness improved with amb    Mobility  Bed Mobility Bed Mobility: Supine to Sit;Sit to Supine Supine to Sit: 5: Supervision;HOB flat Sit to Supine: 5: Supervision;HOB flat Details for Bed Mobility Assistance: pt with safe technique Transfers Transfers: Sit to Stand;Stand to Sit Sit to Stand: With upper extremity assist;From bed;5: Supervision Stand to Sit: With upper extremity assist;To chair/3-in-1;5: Supervision Details for Transfer Assistance: v/c's for safe hand placement and technique, pt with good return demonstration Ambulation/Gait Ambulation/Gait Assistance: 5: Supervision Ambulation Distance (Feet): 500 Feet Assistive device: Rolling walker Ambulation/Gait Assistance Details: v/c's for sequencing intitially. pt transitioned to reciprocal gait pattern toward end of amb. pt with narrow base of support Gait Pattern: Step-through pattern;Decreased stride  length;Antalgic;Narrow base of support;Decreased stance time - right Gait velocity: wfl Stairs: Yes Stairs Assistance: 4: Min guard Stair Management Technique: No rails;Backwards;With walker Number of Stairs: 1 (Pt states he plans on going in garage door; just 1 step)    Exercises     PT Diagnosis:    PT Problem List:   PT Treatment Interventions:     PT Goals (current goals can now be found in the care plan section) Acute Rehab PT Goals Patient Stated Goal: home PT Goal Formulation: With patient Time For Goal Achievement: 04/13/13 Potential to Achieve Goals: Good  Visit Information  Last PT Received On: 04/07/13 Assistance Needed: +1 History of Present Illness: Pt s/p R anterior direct THA.    Subjective Data  Subjective: Looking forward t going home Patient Stated Goal: home   Cognition  Cognition Arousal/Alertness: Awake/alert Behavior During Therapy: WFL for tasks assessed/performed Overall Cognitive Status: Within Functional Limits for tasks assessed    Balance     End of Session PT - End of Session Activity Tolerance: Patient tolerated treatment well Patient left: in chair;with call bell/phone within reach Nurse Communication: Mobility status   GP     Van Clines [facility] [place], [place] [phone]  04/07/2013, 12:48 PM

## 2013-04-07 NOTE — Discharge Summary (Signed)
Physician Discharge Summary   Patient ID: [name] [name] MRN: [id number] DOB/AGE: December 11, 1935 77 y.o.  Admit date: 04/05/2013 Discharge date: 04/07/2013  Primary Diagnosis:  Osteoarthritis of the Right hip.  Admission Diagnoses:  Past Medical History  Diagnosis Date  . Abscess of groin, left   . Hx MRSA infection     after heart surgery several yrs ago  . Coronary artery disease   . Hypertension   . Abnormal stress test   . Chest pressure   . Exertional dyspnea   . H/O hiatal hernia   . Gastroesophageal reflux disease with hiatal hernia     "shocsky ring", no longer using Rx  . Cancer     basal cell, history of   . Arthritis     R hip & knees   Discharge Diagnoses:   Principal Problem:   OA (osteoarthritis) of hip  Estimated body mass index is 21.07 kg/(m^2) as calculated from the following:   Height as of 03/26/13: 5' 10.5" (1.791 m).   Weight as of 11/30/12: 67.586 kg (149 lb).  Procedure(s) (LRB): RIGHT TOTAL HIP ARTHROPLASTY ANTERIOR APPROACH (Right)   Consults: None  HPI: [name] [name] is a 77 y.o. male who has advanced end-  stage arthritis of his Right hip with progressively worsening pain and  dysfunction.The patient has failed nonoperative management and presents for  total hip arthroplasty.   Laboratory Data: Admission on 04/05/2013, Discharged on 04/07/2013  Component Date Value Range Status  . WBC 04/06/2013 11.1* 4.0 - 10.5 K/uL Final  . RBC 04/06/2013 3.68* 4.22 - 5.81 MIL/uL Final  . Hemoglobin 04/06/2013 12.5* 13.0 - 17.0 g/dL Final  . HCT 40/98/1191 34.5* 39.0 - 52.0 % Final  . MCV 04/06/2013 93.8  78.0 - 100.0 fL Final  . MCH 04/06/2013 34.0  26.0 - 34.0 pg Final  . MCHC 04/06/2013 36.2* 30.0 - 36.0 g/dL Final  . RDW 47/82/9562 12.6  11.5 - 15.5 % Final  . Platelets 04/06/2013 205  150 - 400 K/uL Final  . Sodium 04/06/2013 133* 135 - 145 mEq/L Final  . Potassium 04/06/2013 3.4* 3.5 - 5.1 mEq/L Final  . Chloride 04/06/2013 99  96 - 112  mEq/L Final  . CO2 04/06/2013 23  19 - 32 mEq/L Final  . Glucose, Bld 04/06/2013 187* 70 - 99 mg/dL Final  . BUN 13/12/6576 14  6 - 23 mg/dL Final  . Creatinine, Ser 04/06/2013 0.69  0.50 - 1.35 mg/dL Final  . Calcium 46/96/2952 8.4  8.4 - 10.5 mg/dL Final  . GFR calc non Af Amer 04/06/2013 >90  >90 mL/min Final  . GFR calc Af Amer 04/06/2013 >90  >90 mL/min Final   Comment: (NOTE)                          The eGFR has been calculated using the CKD EPI equation.                          This calculation has not been validated in all clinical situations.                          eGFR's persistently <90 mL/min signify possible Chronic Kidney                          Disease.  . WBC 04/07/2013 12.9* 4.0 -  10.5 K/uL Final  . RBC 04/07/2013 3.35* 4.22 - 5.81 MIL/uL Final  . Hemoglobin 04/07/2013 11.2* 13.0 - 17.0 g/dL Final  . HCT 40/98/1191 31.8* 39.0 - 52.0 % Final  . MCV 04/07/2013 94.9  78.0 - 100.0 fL Final  . MCH 04/07/2013 33.4  26.0 - 34.0 pg Final  . MCHC 04/07/2013 35.2  30.0 - 36.0 g/dL Final  . RDW 47/82/9562 13.1  11.5 - 15.5 % Final  . Platelets 04/07/2013 199  150 - 400 K/uL Final  . Sodium 04/07/2013 139  135 - 145 mEq/L Final  . Potassium 04/07/2013 4.1  3.5 - 5.1 mEq/L Final   Comment: DELTA CHECK NOTED                          NO VISIBLE HEMOLYSIS  . Chloride 04/07/2013 104  96 - 112 mEq/L Final  . CO2 04/07/2013 26  19 - 32 mEq/L Final  . Glucose, Bld 04/07/2013 129* 70 - 99 mg/dL Final  . BUN 13/12/6576 12  6 - 23 mg/dL Final  . Creatinine, Ser 04/07/2013 0.65  0.50 - 1.35 mg/dL Final  . Calcium 46/96/2952 8.5  8.4 - 10.5 mg/dL Final  . GFR calc non Af Amer 04/07/2013 >90  >90 mL/min Final  . GFR calc Af Amer 04/07/2013 >90  >90 mL/min Final   Comment: (NOTE)                          The eGFR has been calculated using the CKD EPI equation.                          This calculation has not been validated in all clinical situations.                          eGFR's  persistently <90 mL/min signify possible Chronic Kidney                          Disease.  Hospital Outpatient Visit on 03/26/2013  Component Date Value Range Status  . aPTT 03/26/2013 26  24 - 37 seconds Final  . WBC 03/26/2013 6.9  4.0 - 10.5 K/uL Final  . RBC 03/26/2013 4.65  4.22 - 5.81 MIL/uL Final  . Hemoglobin 03/26/2013 16.0  13.0 - 17.0 g/dL Final  . HCT 84/13/2440 43.9  39.0 - 52.0 % Final  . MCV 03/26/2013 94.4  78.0 - 100.0 fL Final  . MCH 03/26/2013 34.4* 26.0 - 34.0 pg Final  . MCHC 03/26/2013 36.4* 30.0 - 36.0 g/dL Final  . RDW 03/12/2535 12.4  11.5 - 15.5 % Final  . Platelets 03/26/2013 225  150 - 400 K/uL Final  . Sodium 03/26/2013 135  135 - 145 mEq/L Final  . Potassium 03/26/2013 4.0  3.5 - 5.1 mEq/L Final  . Chloride 03/26/2013 99  96 - 112 mEq/L Final  . CO2 03/26/2013 26  19 - 32 mEq/L Final  . Glucose, Bld 03/26/2013 100* 70 - 99 mg/dL Final  . BUN 64/40/3474 16  6 - 23 mg/dL Final  . Creatinine, Ser 03/26/2013 0.84  0.50 - 1.35 mg/dL Final  . Calcium 25/95/6387 9.8  8.4 - 10.5 mg/dL Final  . Total Protein 03/26/2013 7.4  6.0 - 8.3 g/dL Final  . Albumin 56/43/3295 4.2  3.5 - 5.2  g/dL Final  . AST 16/02/9603 20  0 - 37 U/L Final  . ALT 03/26/2013 17  0 - 53 U/L Final  . Alkaline Phosphatase 03/26/2013 93  39 - 117 U/L Final  . Total Bilirubin 03/26/2013 0.8  0.3 - 1.2 mg/dL Final  . GFR calc non Af Amer 03/26/2013 84* >90 mL/min Final  . GFR calc Af Amer 03/26/2013 >90  >90 mL/min Final   Comment: (NOTE)                          The eGFR has been calculated using the CKD EPI equation.                          This calculation has not been validated in all clinical situations.                          eGFR's persistently <90 mL/min signify possible Chronic Kidney                          Disease.  Marland Kitchen Prothrombin Time 03/26/2013 12.5  11.6 - 15.2 seconds Final  . INR 03/26/2013 0.95  0.00 - 1.49 Final  . ABO/RH(D) 03/26/2013 A NEG   Final  . Antibody Screen  03/26/2013 NEG   Final  . Sample Expiration 03/26/2013 04/09/2013   Final  . Color, Urine 03/26/2013 YELLOW  YELLOW Final  . APPearance 03/26/2013 CLEAR  CLEAR Final  . Specific Gravity, Urine 03/26/2013 1.019  1.005 - 1.030 Final  . pH 03/26/2013 5.5  5.0 - 8.0 Final  . Glucose, UA 03/26/2013 NEGATIVE  NEGATIVE mg/dL Final  . Hgb urine dipstick 03/26/2013 NEGATIVE  NEGATIVE Final  . Bilirubin Urine 03/26/2013 NEGATIVE  NEGATIVE Final  . Ketones, ur 03/26/2013 NEGATIVE  NEGATIVE mg/dL Final  . Protein, ur 54/01/8118 NEGATIVE  NEGATIVE mg/dL Final  . Urobilinogen, UA 03/26/2013 1.0  0.0 - 1.0 mg/dL Final  . Nitrite 14/78/2956 NEGATIVE  NEGATIVE Final  . Leukocytes, UA 03/26/2013 NEGATIVE  NEGATIVE Final   MICROSCOPIC NOT DONE ON URINES WITH NEGATIVE PROTEIN, BLOOD, LEUKOCYTES, NITRITE, OR GLUCOSE <1000 mg/dL.  Marland Kitchen MRSA, PCR 03/26/2013 NEGATIVE  NEGATIVE Final  . Staphylococcus aureus 03/26/2013 NEGATIVE  NEGATIVE Final   Comment:                                 The Xpert SA Assay (FDA                          approved for NASAL specimens                          in patients over 23 years of age),                          is one component of                          a comprehensive surveillance                          program.  Test performance has  been validated by [facility] for patients greater                          than or equal to 42 year old.                          It is not intended                          to diagnose infection nor to                          guide or monitor treatment.  . ABO/RH(D) 03/26/2013 A NEG   Final     X-Rays:Dg Hip Complete Right  03/26/2013   CLINICAL DATA:  Osteoarthritis right hip.  EXAM: RIGHT HIP - COMPLETE 2+ VIEW  COMPARISON:  None.  FINDINGS: Advanced osteoarthritic changes within the right hip. Flattening of the right femoral head an complete joint space loss superiorly.  No fracture,  subluxation or dislocation.  IMPRESSION: Advanced osteoarthritic changes in the right hip. No acute findings.   Electronically Signed   By: Charlett Nose M.D.   On: 03/26/2013 14:34   Dg Hip Operative Right  04/05/2013   CLINICAL DATA:  Right hip replacement.  EXAM: DG OPERATIVE RIGHT HIP  TECHNIQUE: A single spot fluoroscopic AP image of the right hip is submitted.  COMPARISON:  03/26/2013  FINDINGS: 2 intraoperative images demonstrate changes of right hip replacement. Normal AP alignment. No hardware or bony complicating feature visualized.  IMPRESSION: Right hip replacement.  No visible complicating feature.   Electronically Signed   By: Charlett Nose M.D.   On: 04/05/2013 18:48   Dg Pelvis Portable  04/05/2013   CLINICAL DATA:  Postoperative  EXAM: PORTABLE PELVIS 1-2 VIEWS  COMPARISON:  None.  FINDINGS: Patient status post total right hip replacement. There is no acute fracture or dislocation noted. There are degenerative joint changes of the lower lumbar spine.  IMPRESSION: Total right hip replacement without malalignment. There is no acute fracture or dislocation.   Electronically Signed   By: Sherian Rein M.D.   On: 04/05/2013 19:33    EKG: Orders placed during the hospital encounter of 10/03/12  . EKG     Hospital Course: Patient was admitted to [facility] and taken to the OR and underwent the above state procedure without complications.  Patient tolerated the procedure well and was later transferred to the recovery room and then to the orthopaedic floor for postoperative care.  They were given PO and IV analgesics for pain control following their surgery.  They were given 24 hours of postoperative antibiotics of  Anti-infectives   Start     Dose/Rate Route Frequency Ordered Stop   04/05/13 2130  ceFAZolin (ANCEF) IVPB 1 g/50 mL premix     1 g 100 mL/hr over 30 Minutes Intravenous Every 6 hours 04/05/13 1953 04/06/13 0335   04/05/13 0600  ceFAZolin (ANCEF) IVPB 2 g/50 mL  premix     2 g 100 mL/hr over 30 Minutes Intravenous On call to O.R. 04/04/13 1459 04/05/13 1551     and started on DVT prophylaxis in the form of Xarelto.   PT and  OT were ordered for total hip protocol.  The patient was allowed to be WBAT with therapy. Discharge planning was consulted to help with postop disposition and equipment needs.  Patient had a good night on the evening of surgery.  They started to get up OOB with therapy on day one.  Hemovac drain was pulled without difficulty.  Continued to work with therapy into day two.  Dressing was changed on day two and the incision was healing well.   Patient was seen in rounds and was ready to go home.  Discharge home with home health  Diet - Cardiac diet  Follow up - in 2 weeks  Activity - WBAT  Disposition - Home  Condition Upon Discharge - Good  D/C Meds - See DC Summary  DVT Prophylaxis - Xarelto       Discharge Orders   Future Orders Complete By Expires   Call MD / Call 911  As directed    Comments:     If you experience chest pain or shortness of breath, CALL 911 and be transported to the hospital emergency room.  If you develope a fever above 101 F, pus (white drainage) or increased drainage or redness at the wound, or calf pain, call your surgeon's office.   Change dressing  As directed    Comments:     You may change your dressing dressing daily with sterile 4 x 4 inch gauze dressing and paper tape.  Do not submerge the incision under water.   Constipation Prevention  As directed    Comments:     Drink plenty of fluids.  Prune juice may be helpful.  You may use a stool softener, such as Colace (over the counter) 100 mg twice a day.  Use MiraLax (over the counter) for constipation as needed.   Diet - low sodium heart healthy  As directed    Discharge instructions  As directed    Comments:     Pick up stool softner and laxative for home. Do not submerge incision under water. May shower. Continue to use ice for pain and  swelling from surgery.  Total Hip Protocol.  Take Xarelto for two and a half more weeks, then discontinue Xarelto. Once the patient has completed the Xarelto, they may resume the 325 mg Aspirin. Resume the Plavix once you have finished the Xarelto.   Do not sit on low chairs, stoools or toilet seats, as it may be difficult to get up from low surfaces  As directed    Driving restrictions  As directed    Comments:     No driving until released by the physician.   Increase activity slowly as tolerated  As directed    Lifting restrictions  As directed    Comments:     No lifting until released by the physician.   Patient may shower  As directed    Comments:     You may shower without a dressing once there is no drainage.  Do not wash over the wound.  If drainage remains, do not shower until drainage stops.   TED hose  As directed    Comments:     Use stockings (TED hose) for 3 weeks on both leg(s).  You may remove them at night for sleeping.   Weight bearing as tolerated  As directed    Questions:     Laterality:     Extremity:         Medication List  STOP taking these medications       clopidogrel 75 MG tablet  Commonly known as:  PLAVIX     ibuprofen 200 MG tablet  Commonly known as:  ADVIL,MOTRIN      TAKE these medications       aspirin 325 MG tablet  Take 325 mg by mouth every morning.     benazepril-hydrochlorthiazide 20-25 MG per tablet  Commonly known as:  LOTENSIN HCT  Take 1 tablet by mouth every morning.     carvedilol 6.25 MG tablet  Commonly known as:  COREG  Take 0.5 tablets (3.125 mg total) by mouth 2 (two) times daily with a meal.     methocarbamol 500 MG tablet  Commonly known as:  ROBAXIN  Take 1 tablet (500 mg total) by mouth every 6 (six) hours as needed for muscle spasms.     nitroGLYCERIN 0.4 MG SL tablet  Commonly known as:  NITROSTAT  Place 0.4 mg under the tongue every 5 (five) minutes as needed for chest pain.     oxyCODONE 5 MG  immediate release tablet  Commonly known as:  Oxy IR/ROXICODONE  Take 1-2 tablets (5-10 mg total) by mouth every 3 (three) hours as needed for breakthrough pain.     rivaroxaban 10 MG Tabs tablet  Commonly known as:  XARELTO  - Take 1 tablet (10 mg total) by mouth daily with breakfast. Take Xarelto for two and a half more weeks, then discontinue Xarelto.  - Once the patient has completed the Xarelto, they may resume the Aspirin.     rosuvastatin 20 MG tablet  Commonly known as:  CRESTOR  Take 1 tablet (20 mg total) by mouth at bedtime.     traMADol 50 MG tablet  Commonly known as:  ULTRAM  Take 1-2 tablets (50-100 mg total) by mouth every 6 (six) hours as needed for moderate pain.       Follow-up Information   Follow up with Loanne Drilling, MD On 04/18/2013. (Call [phone] Monday to make the appointment)    Specialty:  Orthopedic Surgery   Contact information:   [address] [place] [address] [phone]       Follow up with [facility]. (home health physical therapy)    Contact information:   [address] [place] [address] [phone]       Signed: Patrica Duel 04/18/2013, 1:56 PM

## 2013-04-07 NOTE — Progress Notes (Signed)
   Subjective: 2 Days Post-Op Procedure(s) (LRB): RIGHT TOTAL HIP ARTHROPLASTY ANTERIOR APPROACH (Right) Patient reports pain as mild.   Patient seen in rounds with Dr. Lequita Halt. Patient is well, and has had no acute complaints or problems Patient is ready to go home  Objective: Vital signs in last 24 hours: Temp:  [98 F (36.7 C)-98.3 F (36.8 C)] 98 F (36.7 C) (11/23 0527) Pulse Rate:  [77-84] 80 (11/23 0527) Resp:  [18] 18 (11/23 0527) BP: (116-124)/(57-71) 124/71 mmHg (11/23 0527) SpO2:  [92 %-96 %] 95 % (11/23 0527)  Intake/Output from previous day:  Intake/Output Summary (Last 24 hours) at 04/07/13 1001 Last data filed at 04/07/13 0528  Gross per 24 hour  Intake    360 ml  Output    250 ml  Net    110 ml    Intake/Output this shift:    Labs:  Recent Labs  04/06/13 0325 04/07/13 0543  HGB 12.5* 11.2*    Recent Labs  04/06/13 0325 04/07/13 0543  WBC 11.1* 12.9*  RBC 3.68* 3.35*  HCT 34.5* 31.8*  PLT 205 199    Recent Labs  04/06/13 0325 04/07/13 0543  NA 133* 139  K 3.4* 4.1  CL 99 104  CO2 23 26  BUN 14 12  CREATININE 0.69 0.65  GLUCOSE 187* 129*  CALCIUM 8.4 8.5   No results found for this basename: LABPT, INR,  in the last 72 hours  EXAM: General - Patient is Alert, Appropriate and Oriented Extremity - Neurovascular intact Sensation intact distally Dorsiflexion/Plantar flexion intact Incision - clean, dry, no drainage, necrotic Motor Function - intact, moving foot and toes well on exam.   Assessment/Plan: 2 Days Post-Op Procedure(s) (LRB): RIGHT TOTAL HIP ARTHROPLASTY ANTERIOR APPROACH (Right) Procedure(s) (LRB): RIGHT TOTAL HIP ARTHROPLASTY ANTERIOR APPROACH (Right) Past Medical History  Diagnosis Date  . Abscess of groin, left   . Hx MRSA infection     after heart surgery several yrs ago  . Coronary artery disease   . Hypertension   . Abnormal stress test   . Chest pressure   . Exertional dyspnea   . H/O hiatal hernia     . Gastroesophageal reflux disease with hiatal hernia     "shocsky ring", no longer using Rx  . Cancer     basal cell, history of   . Arthritis     R hip & knees   Principal Problem:   OA (osteoarthritis) of hip  Estimated body mass index is 21.07 kg/(m^2) as calculated from the following:   Height as of 03/26/13: 5' 10.5" (1.791 m).   Weight as of 11/30/12: 67.586 kg (149 lb). Up with therapy Discharge home with home health Diet - Cardiac diet Follow up - in 2 weeks Activity - WBAT Disposition - Home Condition Upon Discharge - Good D/C Meds - See DC Summary DVT Prophylaxis - Xarelto  PERKINS, ALEXZANDREW 04/07/2013, 10:01 AM

## 2013-04-08 DIAGNOSIS — M1710 Unilateral primary osteoarthritis, unspecified knee: Secondary | ICD-10-CM | POA: Diagnosis not present

## 2013-04-08 DIAGNOSIS — Z471 Aftercare following joint replacement surgery: Secondary | ICD-10-CM | POA: Diagnosis not present

## 2013-04-08 DIAGNOSIS — I2510 Atherosclerotic heart disease of native coronary artery without angina pectoris: Secondary | ICD-10-CM | POA: Diagnosis not present

## 2013-04-08 DIAGNOSIS — IMO0001 Reserved for inherently not codable concepts without codable children: Secondary | ICD-10-CM | POA: Diagnosis not present

## 2013-04-08 DIAGNOSIS — IMO0002 Reserved for concepts with insufficient information to code with codable children: Secondary | ICD-10-CM | POA: Diagnosis not present

## 2013-04-08 DIAGNOSIS — Z96649 Presence of unspecified artificial hip joint: Secondary | ICD-10-CM | POA: Diagnosis not present

## 2013-04-08 NOTE — Progress Notes (Signed)
Utilization review completed.  

## 2013-04-09 ENCOUNTER — Encounter (HOSPITAL_COMMUNITY): Payer: Self-pay | Admitting: Orthopedic Surgery

## 2013-04-10 DIAGNOSIS — M1710 Unilateral primary osteoarthritis, unspecified knee: Secondary | ICD-10-CM | POA: Diagnosis not present

## 2013-04-10 DIAGNOSIS — I2510 Atherosclerotic heart disease of native coronary artery without angina pectoris: Secondary | ICD-10-CM | POA: Diagnosis not present

## 2013-04-10 DIAGNOSIS — IMO0002 Reserved for concepts with insufficient information to code with codable children: Secondary | ICD-10-CM | POA: Diagnosis not present

## 2013-04-10 DIAGNOSIS — Z96649 Presence of unspecified artificial hip joint: Secondary | ICD-10-CM | POA: Diagnosis not present

## 2013-04-10 DIAGNOSIS — IMO0001 Reserved for inherently not codable concepts without codable children: Secondary | ICD-10-CM | POA: Diagnosis not present

## 2013-04-10 DIAGNOSIS — Z471 Aftercare following joint replacement surgery: Secondary | ICD-10-CM | POA: Diagnosis not present

## 2013-04-12 DIAGNOSIS — Z471 Aftercare following joint replacement surgery: Secondary | ICD-10-CM | POA: Diagnosis not present

## 2013-04-12 DIAGNOSIS — Z96649 Presence of unspecified artificial hip joint: Secondary | ICD-10-CM | POA: Diagnosis not present

## 2013-04-12 DIAGNOSIS — IMO0002 Reserved for concepts with insufficient information to code with codable children: Secondary | ICD-10-CM | POA: Diagnosis not present

## 2013-04-12 DIAGNOSIS — M1710 Unilateral primary osteoarthritis, unspecified knee: Secondary | ICD-10-CM | POA: Diagnosis not present

## 2013-04-12 DIAGNOSIS — I2510 Atherosclerotic heart disease of native coronary artery without angina pectoris: Secondary | ICD-10-CM | POA: Diagnosis not present

## 2013-04-12 DIAGNOSIS — IMO0001 Reserved for inherently not codable concepts without codable children: Secondary | ICD-10-CM | POA: Diagnosis not present

## 2013-04-15 DIAGNOSIS — IMO0001 Reserved for inherently not codable concepts without codable children: Secondary | ICD-10-CM | POA: Diagnosis not present

## 2013-04-15 DIAGNOSIS — Z96649 Presence of unspecified artificial hip joint: Secondary | ICD-10-CM | POA: Diagnosis not present

## 2013-04-15 DIAGNOSIS — I2510 Atherosclerotic heart disease of native coronary artery without angina pectoris: Secondary | ICD-10-CM | POA: Diagnosis not present

## 2013-04-15 DIAGNOSIS — M1710 Unilateral primary osteoarthritis, unspecified knee: Secondary | ICD-10-CM | POA: Diagnosis not present

## 2013-04-15 DIAGNOSIS — Z471 Aftercare following joint replacement surgery: Secondary | ICD-10-CM | POA: Diagnosis not present

## 2013-04-15 DIAGNOSIS — IMO0002 Reserved for concepts with insufficient information to code with codable children: Secondary | ICD-10-CM | POA: Diagnosis not present

## 2013-04-17 DIAGNOSIS — Z96649 Presence of unspecified artificial hip joint: Secondary | ICD-10-CM | POA: Diagnosis not present

## 2013-04-17 DIAGNOSIS — I2510 Atherosclerotic heart disease of native coronary artery without angina pectoris: Secondary | ICD-10-CM | POA: Diagnosis not present

## 2013-04-17 DIAGNOSIS — IMO0002 Reserved for concepts with insufficient information to code with codable children: Secondary | ICD-10-CM | POA: Diagnosis not present

## 2013-04-17 DIAGNOSIS — Z471 Aftercare following joint replacement surgery: Secondary | ICD-10-CM | POA: Diagnosis not present

## 2013-04-17 DIAGNOSIS — IMO0001 Reserved for inherently not codable concepts without codable children: Secondary | ICD-10-CM | POA: Diagnosis not present

## 2013-04-17 DIAGNOSIS — M1710 Unilateral primary osteoarthritis, unspecified knee: Secondary | ICD-10-CM | POA: Diagnosis not present

## 2013-04-19 DIAGNOSIS — Z471 Aftercare following joint replacement surgery: Secondary | ICD-10-CM | POA: Diagnosis not present

## 2013-04-19 DIAGNOSIS — Z96649 Presence of unspecified artificial hip joint: Secondary | ICD-10-CM | POA: Diagnosis not present

## 2013-04-19 DIAGNOSIS — I2510 Atherosclerotic heart disease of native coronary artery without angina pectoris: Secondary | ICD-10-CM | POA: Diagnosis not present

## 2013-04-19 DIAGNOSIS — IMO0002 Reserved for concepts with insufficient information to code with codable children: Secondary | ICD-10-CM | POA: Diagnosis not present

## 2013-04-19 DIAGNOSIS — IMO0001 Reserved for inherently not codable concepts without codable children: Secondary | ICD-10-CM | POA: Diagnosis not present

## 2013-04-19 DIAGNOSIS — M1710 Unilateral primary osteoarthritis, unspecified knee: Secondary | ICD-10-CM | POA: Diagnosis not present

## 2013-04-23 DIAGNOSIS — Z96649 Presence of unspecified artificial hip joint: Secondary | ICD-10-CM | POA: Diagnosis not present

## 2013-04-23 DIAGNOSIS — IMO0001 Reserved for inherently not codable concepts without codable children: Secondary | ICD-10-CM | POA: Diagnosis not present

## 2013-04-23 DIAGNOSIS — Z471 Aftercare following joint replacement surgery: Secondary | ICD-10-CM | POA: Diagnosis not present

## 2013-04-23 DIAGNOSIS — IMO0002 Reserved for concepts with insufficient information to code with codable children: Secondary | ICD-10-CM | POA: Diagnosis not present

## 2013-04-23 DIAGNOSIS — I2510 Atherosclerotic heart disease of native coronary artery without angina pectoris: Secondary | ICD-10-CM | POA: Diagnosis not present

## 2013-04-23 DIAGNOSIS — M1710 Unilateral primary osteoarthritis, unspecified knee: Secondary | ICD-10-CM | POA: Diagnosis not present

## 2013-04-25 DIAGNOSIS — I2510 Atherosclerotic heart disease of native coronary artery without angina pectoris: Secondary | ICD-10-CM | POA: Diagnosis not present

## 2013-04-25 DIAGNOSIS — Z471 Aftercare following joint replacement surgery: Secondary | ICD-10-CM | POA: Diagnosis not present

## 2013-04-25 DIAGNOSIS — Z96649 Presence of unspecified artificial hip joint: Secondary | ICD-10-CM | POA: Diagnosis not present

## 2013-04-25 DIAGNOSIS — M1710 Unilateral primary osteoarthritis, unspecified knee: Secondary | ICD-10-CM | POA: Diagnosis not present

## 2013-04-25 DIAGNOSIS — IMO0002 Reserved for concepts with insufficient information to code with codable children: Secondary | ICD-10-CM | POA: Diagnosis not present

## 2013-04-25 DIAGNOSIS — IMO0001 Reserved for inherently not codable concepts without codable children: Secondary | ICD-10-CM | POA: Diagnosis not present

## 2013-04-26 DIAGNOSIS — IMO0001 Reserved for inherently not codable concepts without codable children: Secondary | ICD-10-CM | POA: Diagnosis not present

## 2013-04-26 DIAGNOSIS — Z96649 Presence of unspecified artificial hip joint: Secondary | ICD-10-CM | POA: Diagnosis not present

## 2013-04-26 DIAGNOSIS — M1710 Unilateral primary osteoarthritis, unspecified knee: Secondary | ICD-10-CM | POA: Diagnosis not present

## 2013-04-26 DIAGNOSIS — Z471 Aftercare following joint replacement surgery: Secondary | ICD-10-CM | POA: Diagnosis not present

## 2013-04-26 DIAGNOSIS — I2510 Atherosclerotic heart disease of native coronary artery without angina pectoris: Secondary | ICD-10-CM | POA: Diagnosis not present

## 2013-04-26 DIAGNOSIS — IMO0002 Reserved for concepts with insufficient information to code with codable children: Secondary | ICD-10-CM | POA: Diagnosis not present

## 2013-04-29 DIAGNOSIS — M1710 Unilateral primary osteoarthritis, unspecified knee: Secondary | ICD-10-CM | POA: Diagnosis not present

## 2013-04-29 DIAGNOSIS — I2510 Atherosclerotic heart disease of native coronary artery without angina pectoris: Secondary | ICD-10-CM | POA: Diagnosis not present

## 2013-04-29 DIAGNOSIS — IMO0002 Reserved for concepts with insufficient information to code with codable children: Secondary | ICD-10-CM | POA: Diagnosis not present

## 2013-04-29 DIAGNOSIS — Z96649 Presence of unspecified artificial hip joint: Secondary | ICD-10-CM | POA: Diagnosis not present

## 2013-04-29 DIAGNOSIS — Z471 Aftercare following joint replacement surgery: Secondary | ICD-10-CM | POA: Diagnosis not present

## 2013-04-29 DIAGNOSIS — IMO0001 Reserved for inherently not codable concepts without codable children: Secondary | ICD-10-CM | POA: Diagnosis not present

## 2013-04-30 DIAGNOSIS — M1710 Unilateral primary osteoarthritis, unspecified knee: Secondary | ICD-10-CM | POA: Diagnosis not present

## 2013-04-30 DIAGNOSIS — IMO0001 Reserved for inherently not codable concepts without codable children: Secondary | ICD-10-CM | POA: Diagnosis not present

## 2013-04-30 DIAGNOSIS — IMO0002 Reserved for concepts with insufficient information to code with codable children: Secondary | ICD-10-CM | POA: Diagnosis not present

## 2013-04-30 DIAGNOSIS — I2510 Atherosclerotic heart disease of native coronary artery without angina pectoris: Secondary | ICD-10-CM | POA: Diagnosis not present

## 2013-04-30 DIAGNOSIS — Z471 Aftercare following joint replacement surgery: Secondary | ICD-10-CM | POA: Diagnosis not present

## 2013-04-30 DIAGNOSIS — Z96649 Presence of unspecified artificial hip joint: Secondary | ICD-10-CM | POA: Diagnosis not present

## 2013-05-01 DIAGNOSIS — Z96649 Presence of unspecified artificial hip joint: Secondary | ICD-10-CM | POA: Diagnosis not present

## 2013-05-01 DIAGNOSIS — IMO0001 Reserved for inherently not codable concepts without codable children: Secondary | ICD-10-CM | POA: Diagnosis not present

## 2013-05-01 DIAGNOSIS — M1710 Unilateral primary osteoarthritis, unspecified knee: Secondary | ICD-10-CM | POA: Diagnosis not present

## 2013-05-01 DIAGNOSIS — Z471 Aftercare following joint replacement surgery: Secondary | ICD-10-CM | POA: Diagnosis not present

## 2013-05-01 DIAGNOSIS — I2510 Atherosclerotic heart disease of native coronary artery without angina pectoris: Secondary | ICD-10-CM | POA: Diagnosis not present

## 2013-05-01 DIAGNOSIS — IMO0002 Reserved for concepts with insufficient information to code with codable children: Secondary | ICD-10-CM | POA: Diagnosis not present

## 2013-05-23 DIAGNOSIS — Z471 Aftercare following joint replacement surgery: Secondary | ICD-10-CM | POA: Diagnosis not present

## 2013-05-23 DIAGNOSIS — M1610 Unilateral primary osteoarthritis, unspecified hip: Secondary | ICD-10-CM | POA: Diagnosis not present

## 2013-05-23 DIAGNOSIS — M169 Osteoarthritis of hip, unspecified: Secondary | ICD-10-CM | POA: Diagnosis not present

## 2013-08-21 DIAGNOSIS — M169 Osteoarthritis of hip, unspecified: Secondary | ICD-10-CM | POA: Diagnosis not present

## 2013-08-21 DIAGNOSIS — M25819 Other specified joint disorders, unspecified shoulder: Secondary | ICD-10-CM | POA: Diagnosis not present

## 2013-08-21 DIAGNOSIS — M1610 Unilateral primary osteoarthritis, unspecified hip: Secondary | ICD-10-CM | POA: Diagnosis not present

## 2013-08-28 DIAGNOSIS — M25819 Other specified joint disorders, unspecified shoulder: Secondary | ICD-10-CM | POA: Diagnosis not present

## 2013-09-04 DIAGNOSIS — M25819 Other specified joint disorders, unspecified shoulder: Secondary | ICD-10-CM | POA: Diagnosis not present

## 2013-09-11 DIAGNOSIS — M25819 Other specified joint disorders, unspecified shoulder: Secondary | ICD-10-CM | POA: Diagnosis not present

## 2013-09-23 DIAGNOSIS — M25519 Pain in unspecified shoulder: Secondary | ICD-10-CM | POA: Diagnosis not present

## 2013-09-23 DIAGNOSIS — M25819 Other specified joint disorders, unspecified shoulder: Secondary | ICD-10-CM | POA: Diagnosis not present

## 2013-09-28 DIAGNOSIS — M25819 Other specified joint disorders, unspecified shoulder: Secondary | ICD-10-CM | POA: Diagnosis not present

## 2013-10-04 DIAGNOSIS — M25519 Pain in unspecified shoulder: Secondary | ICD-10-CM | POA: Diagnosis not present

## 2013-10-23 DIAGNOSIS — Z85828 Personal history of other malignant neoplasm of skin: Secondary | ICD-10-CM | POA: Diagnosis not present

## 2013-10-23 DIAGNOSIS — L259 Unspecified contact dermatitis, unspecified cause: Secondary | ICD-10-CM | POA: Diagnosis not present

## 2013-10-23 DIAGNOSIS — L821 Other seborrheic keratosis: Secondary | ICD-10-CM | POA: Diagnosis not present

## 2013-10-23 DIAGNOSIS — L570 Actinic keratosis: Secondary | ICD-10-CM | POA: Diagnosis not present

## 2013-11-01 ENCOUNTER — Ambulatory Visit (INDEPENDENT_AMBULATORY_CARE_PROVIDER_SITE_OTHER): Payer: Medicare Other | Admitting: Family Medicine

## 2013-11-01 ENCOUNTER — Encounter: Payer: Self-pay | Admitting: Family Medicine

## 2013-11-01 VITALS — BP 119/71 | HR 76 | Temp 98.5°F | Ht 70.5 in | Wt 157.0 lb

## 2013-11-01 DIAGNOSIS — L259 Unspecified contact dermatitis, unspecified cause: Secondary | ICD-10-CM | POA: Diagnosis not present

## 2013-11-01 MED ORDER — TRIAMCINOLONE ACETONIDE 0.1 % EX CREA: 1.0000 "application " | TOPICAL_CREAM | Freq: Three times a day (TID) | CUTANEOUS | Status: DC

## 2013-11-01 MED ORDER — PREDNISONE 10 MG PO TABS: ORAL_TABLET | ORAL | Status: DC

## 2013-11-01 NOTE — Progress Notes (Signed)
   Subjective:    Patient ID: [name] [name], male    DOB: 1936-05-02, 78 y.o.   MRN: [id number]  HPI Here for 3 days of an itchy rash on the left forearm. Using OTC cortisone creams.    Review of Systems  Constitutional: Negative.   Skin: Positive for rash.       Objective:   Physical Exam  Constitutional: He appears well-developed and well-nourished.  Skin:  Patch of red macular and vesicular skin on left forearm           Assessment & Plan:  Use topical and oral steroids. Recheck prn

## 2013-11-01 NOTE — Progress Notes (Signed)
Pre visit review using our clinic review tool, if applicable. No additional management support is needed unless otherwise documented below in the visit note. 

## 2013-12-11 ENCOUNTER — Other Ambulatory Visit: Payer: Self-pay | Admitting: *Deleted

## 2013-12-11 MED ORDER — CARVEDILOL 6.25 MG PO TABS: 3.1250 mg | ORAL_TABLET | Freq: Two times a day (BID) | ORAL | Status: DC

## 2013-12-11 MED ORDER — NITROGLYCERIN 0.4 MG SL SUBL: 0.4000 mg | SUBLINGUAL_TABLET | SUBLINGUAL | Status: DC | PRN

## 2013-12-11 MED ORDER — BENAZEPRIL-HYDROCHLOROTHIAZIDE 20-25 MG PO TABS: 1.0000 | ORAL_TABLET | Freq: Every morning | ORAL | Status: DC

## 2013-12-11 MED ORDER — ROSUVASTATIN CALCIUM 20 MG PO TABS: 20.0000 mg | ORAL_TABLET | Freq: Every day | ORAL | Status: DC

## 2014-01-08 ENCOUNTER — Ambulatory Visit (INDEPENDENT_AMBULATORY_CARE_PROVIDER_SITE_OTHER): Payer: Medicare Other | Admitting: Cardiovascular Disease

## 2014-01-08 ENCOUNTER — Encounter: Payer: Self-pay | Admitting: Cardiovascular Disease

## 2014-01-08 VITALS — BP 130/84 | HR 60 | Ht 70.0 in | Wt 157.0 lb

## 2014-01-08 DIAGNOSIS — I2510 Atherosclerotic heart disease of native coronary artery without angina pectoris: Secondary | ICD-10-CM

## 2014-01-08 DIAGNOSIS — E785 Hyperlipidemia, unspecified: Secondary | ICD-10-CM

## 2014-01-08 DIAGNOSIS — I10 Essential (primary) hypertension: Secondary | ICD-10-CM | POA: Diagnosis not present

## 2014-01-08 NOTE — Patient Instructions (Signed)
Your physician wants you to follow-up in:  6 MONTHS WITH DR NISHAN  You will receive a reminder letter in the mail two months in advance. If you don't receive a letter, please call our office to schedule the follow-up appointment. Your physician recommends that you continue on your current medications as directed. Please refer to the Current Medication list given to you today. 

## 2014-01-08 NOTE — Progress Notes (Signed)
Patient ID: [name] [name], male   DOB: [birth year], 78 y.o.   MRN: [id number] Asaad seen today in followup for his coronary disease. In July 2009 he had an abnormal Myoview and had stenting of the right coronary artery. He is enrolled in the [employer] research trial. His been compliant with his medications. He is an Probation officer. He is also active with his  grand children  Recent hip surgery with Dr Ninfa Linden  Plavix stopped and not restarted Dr. Sarajane Jews his primary care doctor. There's been no chest pain palpitations PND or orthopnea  ROS: Denies fever, malais, weight loss, blurry vision, decreased visual acuity, cough, sputum, SOB, hemoptysis, pleuritic pain, palpitaitons, heartburn, abdominal pain, melena, lower extremity edema, claudication, or rash.  All other systems reviewed and negative  General: Affect appropriate Healthy:  appears stated age 59: normal Neck supple with no adenopathy JVP normal no bruits no thyromegaly Lungs clear with no wheezing and good diaphragmatic motion Heart:  S1/S2 no murmur, no rub, gallop or click PMI normal Abdomen: benighn, BS positve, no tenderness, no AAA no bruit.  No HSM or HJR Distal pulses intact with no bruits No edema Neuro non-focal Skin warm and dry No muscular weakness  S/P right hip surgery anterior approach   Current Outpatient Prescriptions  Medication Sig Dispense Refill  . aspirin 325 MG tablet Take 325 mg by mouth every morning.       . benazepril-hydrochlorthiazide (LOTENSIN HCT) 20-25 MG per tablet Take 1 tablet by mouth every morning.  30 tablet  0  . carvedilol (COREG) 6.25 MG tablet Take 0.5 tablets (3.125 mg total) by mouth 2 (two) times daily with a meal.  30 tablet  0  . Multiple Vitamin (MULTIVITAMIN) tablet Take 1 tablet by mouth daily.      . nitroGLYCERIN (NITROSTAT) 0.4 MG SL tablet Place 1 tablet (0.4 mg total) under the tongue every 5 (five) minutes as needed for chest pain.  25 tablet  0  . predniSONE (DELTASONE) 10  MG tablet Take 4 tabs a day for 3 days, then 3 a day for 3 days, then 2 a day for 3 days, then 1 a day for 3 days, then stop  60 tablet  0  . rosuvastatin (CRESTOR) 20 MG tablet Take 1 tablet (20 mg total) by mouth daily.  30 tablet  0  . triamcinolone cream (KENALOG) 0.1 % Apply 1 application topically 3 (three) times daily.  30 g  5   No current facility-administered medications for this visit.    Allergies  Review of patient's allergies indicates no known allergies.  Electrocardiogram:  SR rate 68  PR 258 otherwise normal 2014   SR rate 60  PR 276 PAC minor change in PR since 2014  Assessment and Plan

## 2014-01-08 NOTE — Assessment & Plan Note (Signed)
Well controlled.  Continue current medications and low sodium Dash type diet.    

## 2014-01-08 NOTE — Assessment & Plan Note (Signed)
Cholesterol is at goal.  Continue current dose of statin and diet Rx.  No myalgias or side effects.  F/U  LFT's in 6 months. Lab Results  Component Value Date   LDLCALC 54 11/30/2009

## 2014-01-08 NOTE — Assessment & Plan Note (Addendum)
Stable with no angina and good activity level.  Continue medical Rx  No need for plavix

## 2014-01-12 IMAGING — CR DG HIP COMPLETE 2+V*R*
3 series · 3 of 3 positions shown · non-contrast
Comparison: None.

CLINICAL DATA: Osteoarthritis right hip.

EXAM:
RIGHT HIP - COMPLETE 2+ VIEW

[t pelvis ap]
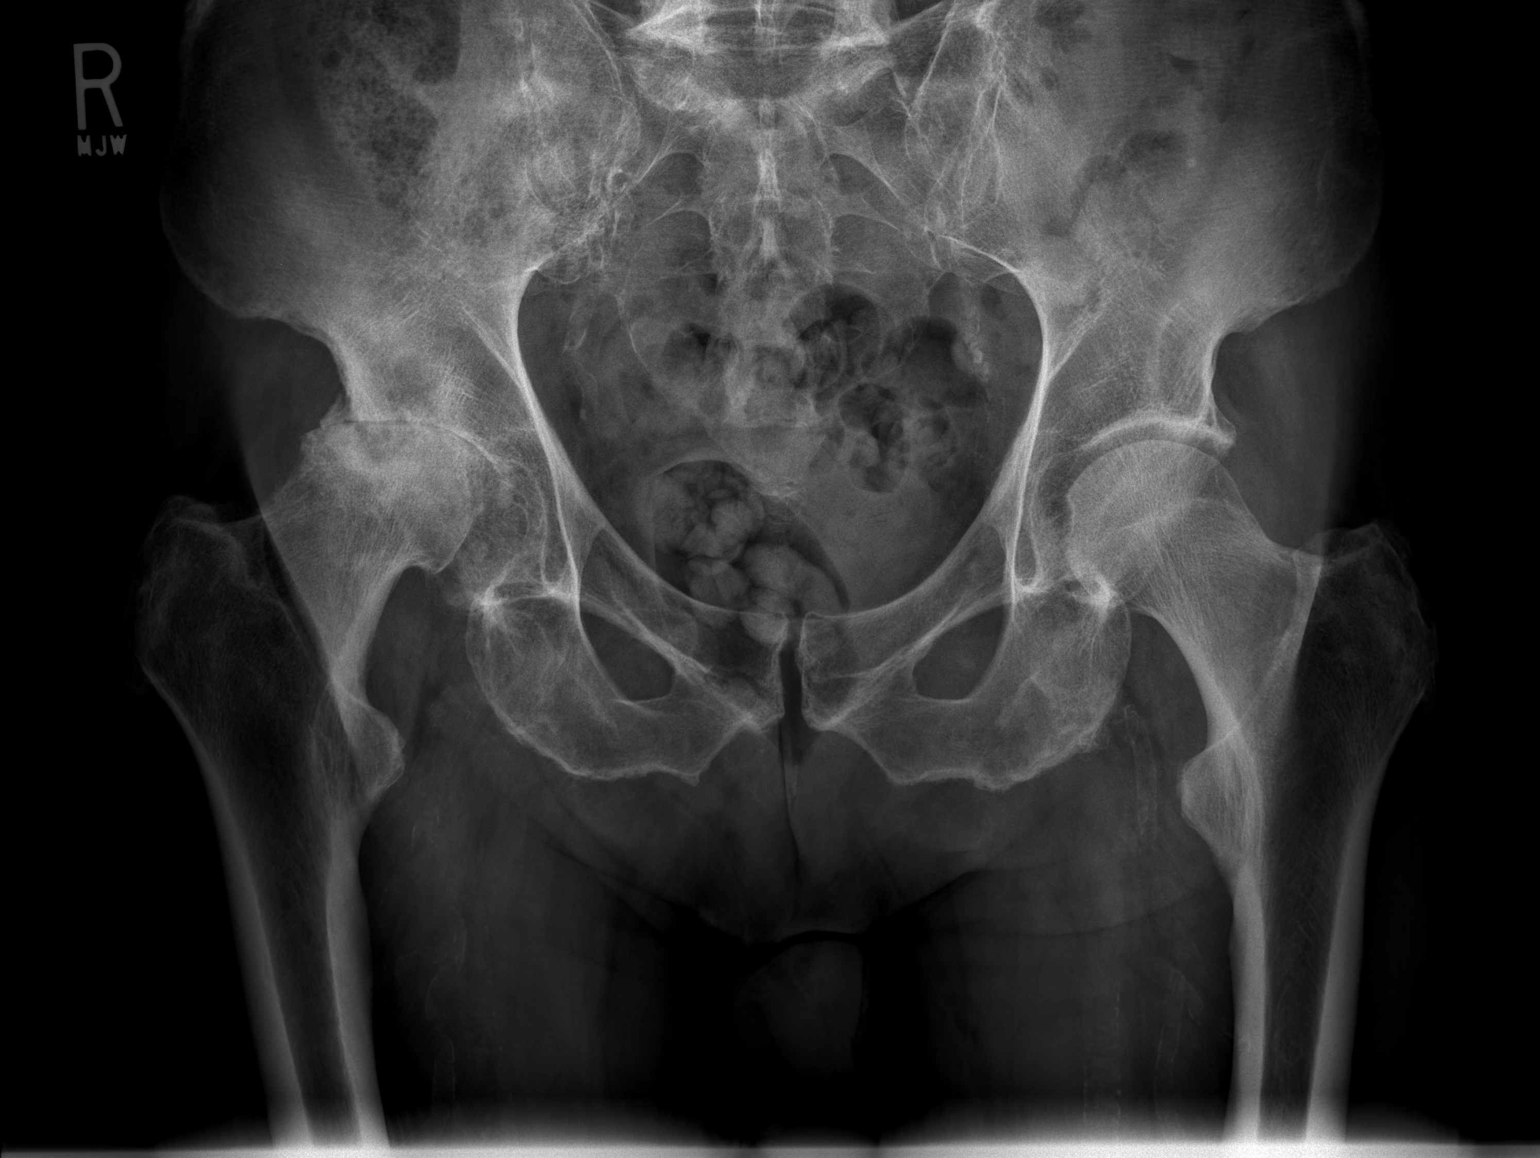

[t hip ap right]
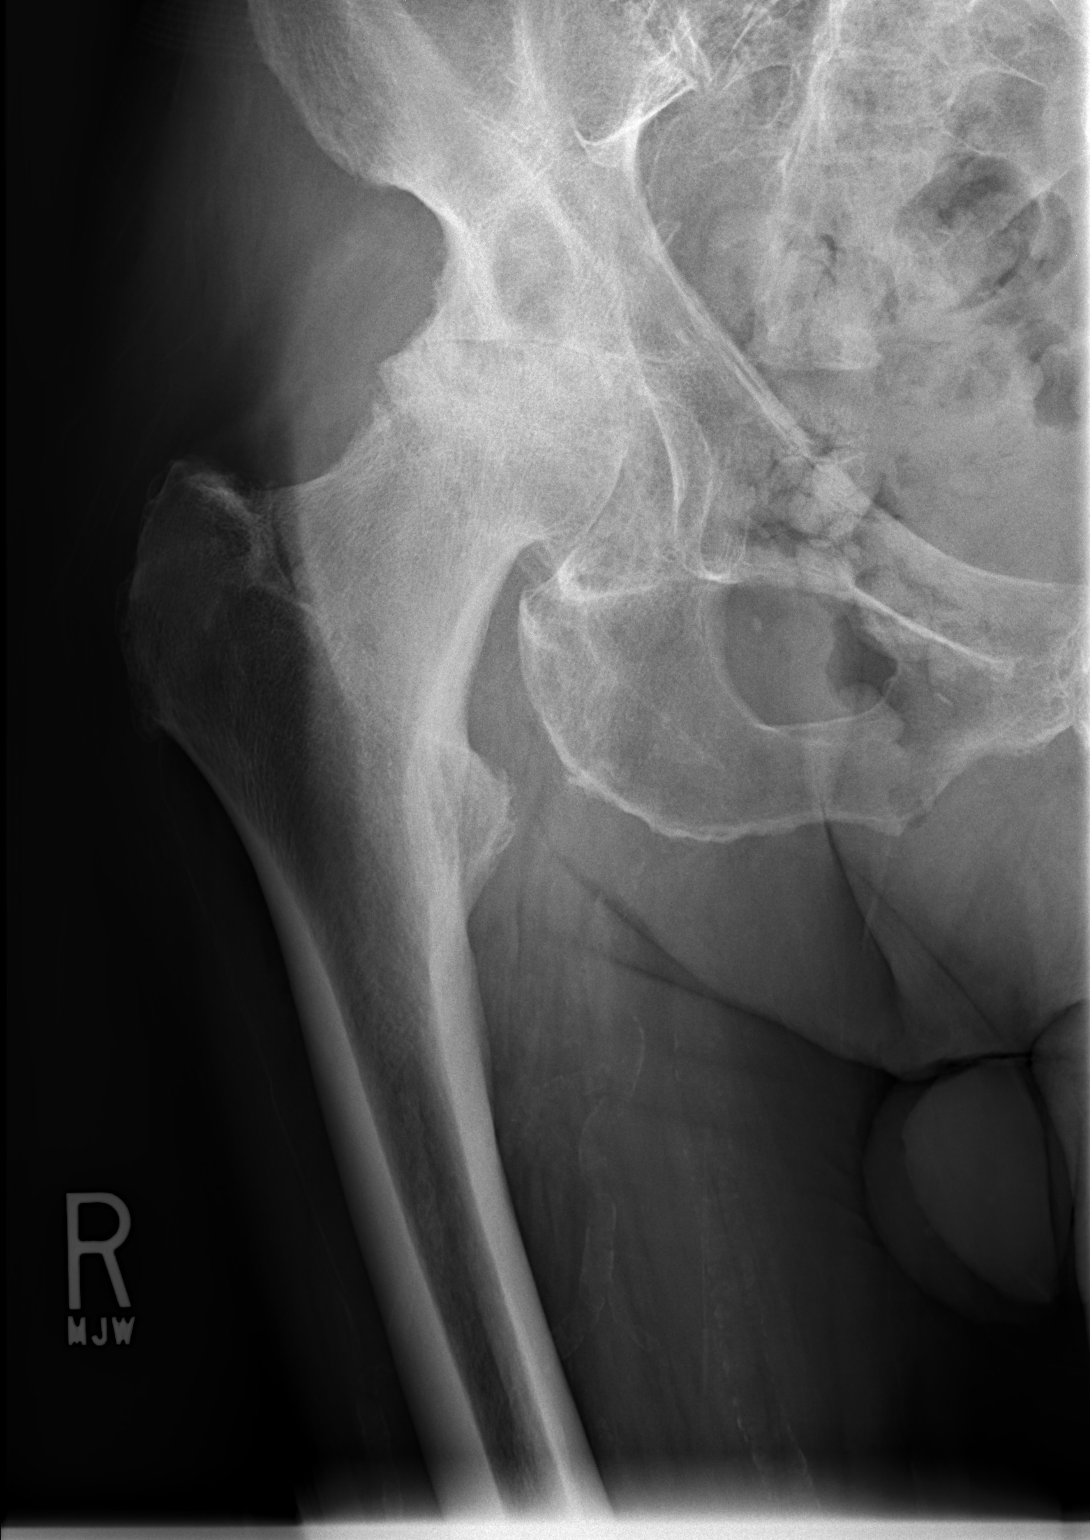

[t hip frog leg right]
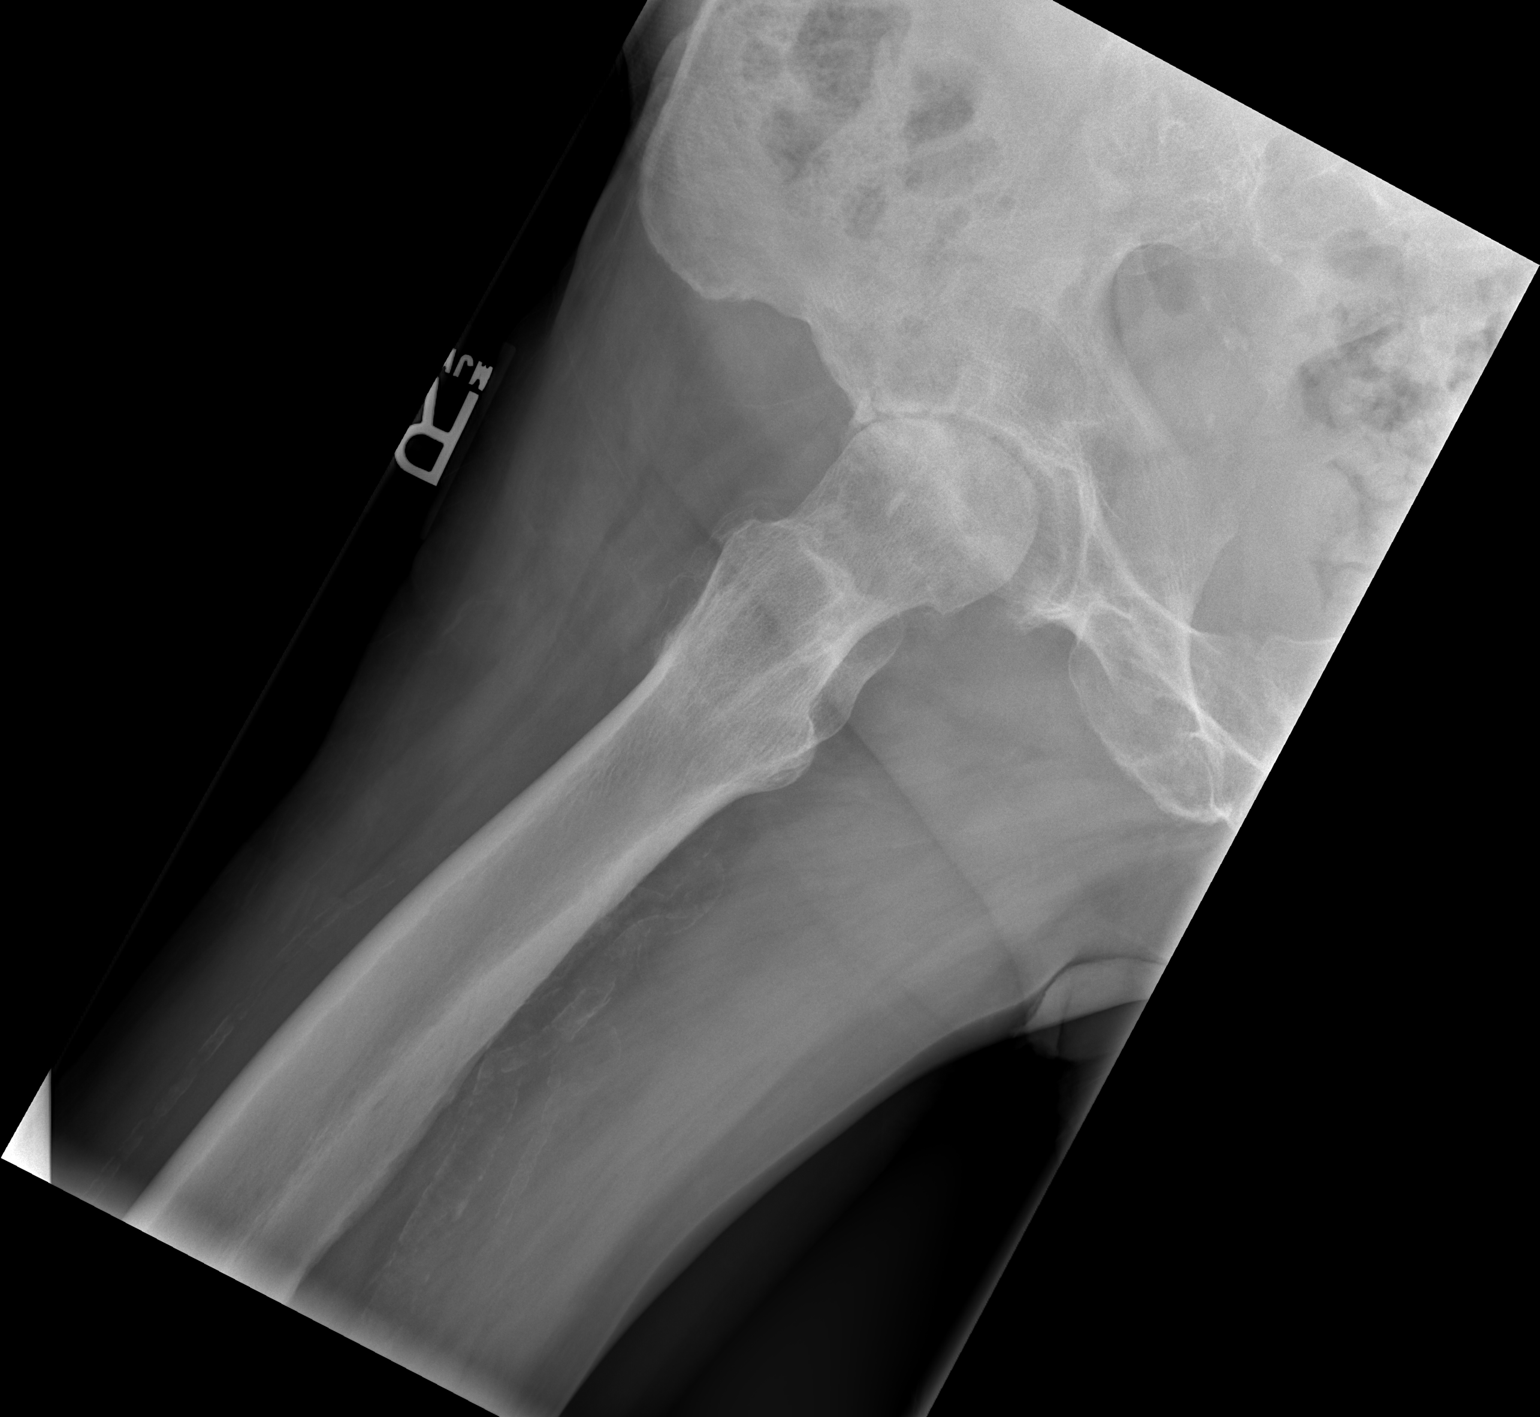

[3 of 3 positions shown; findings below may reference images not displayed]

FINDINGS: Advanced osteoarthritic changes within the right hip. Flattening of
the right femoral head an complete joint space loss superiorly.

No fracture, subluxation or dislocation.
IMPRESSION: Advanced osteoarthritic changes in the right hip. No acute findings.

## 2014-01-13 DIAGNOSIS — M25519 Pain in unspecified shoulder: Secondary | ICD-10-CM | POA: Diagnosis not present

## 2014-01-13 DIAGNOSIS — M25819 Other specified joint disorders, unspecified shoulder: Secondary | ICD-10-CM | POA: Diagnosis not present

## 2014-01-22 ENCOUNTER — Other Ambulatory Visit: Payer: Self-pay

## 2014-01-22 DIAGNOSIS — L570 Actinic keratosis: Secondary | ICD-10-CM | POA: Diagnosis not present

## 2014-01-22 DIAGNOSIS — L821 Other seborrheic keratosis: Secondary | ICD-10-CM | POA: Diagnosis not present

## 2014-01-22 MED ORDER — CARVEDILOL 6.25 MG PO TABS: 3.1250 mg | ORAL_TABLET | Freq: Two times a day (BID) | ORAL | Status: DC

## 2014-01-22 MED ORDER — BENAZEPRIL-HYDROCHLOROTHIAZIDE 20-25 MG PO TABS: 1.0000 | ORAL_TABLET | Freq: Every morning | ORAL | Status: DC

## 2014-01-22 MED ORDER — ROSUVASTATIN CALCIUM 20 MG PO TABS: 20.0000 mg | ORAL_TABLET | Freq: Every day | ORAL | Status: DC

## 2014-02-10 ENCOUNTER — Ambulatory Visit (INDEPENDENT_AMBULATORY_CARE_PROVIDER_SITE_OTHER): Payer: Medicare Other | Admitting: Family Medicine

## 2014-02-10 DIAGNOSIS — Z23 Encounter for immunization: Secondary | ICD-10-CM

## 2014-02-17 ENCOUNTER — Encounter (HOSPITAL_COMMUNITY): Payer: Self-pay | Admitting: Pharmacy Technician

## 2014-02-18 ENCOUNTER — Encounter: Payer: Self-pay | Admitting: Family Medicine

## 2014-02-18 ENCOUNTER — Ambulatory Visit (INDEPENDENT_AMBULATORY_CARE_PROVIDER_SITE_OTHER): Payer: Medicare Other | Admitting: Family Medicine

## 2014-02-18 VITALS — BP 100/62 | HR 67 | Temp 98.7°F | Ht 70.0 in | Wt 157.0 lb

## 2014-02-18 DIAGNOSIS — S91209A Unspecified open wound of unspecified toe(s) with damage to nail, initial encounter: Secondary | ICD-10-CM

## 2014-02-18 DIAGNOSIS — I2510 Atherosclerotic heart disease of native coronary artery without angina pectoris: Secondary | ICD-10-CM

## 2014-02-18 NOTE — Progress Notes (Signed)
Pre visit review using our clinic review tool, if applicable. No additional management support is needed unless otherwise documented below in the visit note. 

## 2014-02-18 NOTE — Pre-Procedure Instructions (Signed)
[name] [name]  02/18/2014   Your procedure is scheduled on:  Thursday, October 15th  Report to [facility] Admitting at 0800 AM.  Call this number if you have problems the morning of surgery: [phone]   Remember:   Do not eat food or drink liquids after midnight.   Take these medicines the morning of surgery with A SIP OF WATER: coreg   Do not wear jewelry, make-up or nail polish.  Do not wear lotions, powders, or perfumes. You may wear deodorant.  Do not shave 48 hours prior to surgery. Men may shave face and neck.  Do not bring valuables to the hospital.  [facility] is not responsible for any belongings or valuables.               Contacts, dentures or bridgework may not be worn into surgery.  Leave suitcase in the car. After surgery it may be brought to your room.  For patients admitted to the hospital, discharge time is determined by your  treatment team.               Patients discharged the day of surgery will not be allowed to drive home.  Please read over the following fact sheets that you were given: Pain Booklet, Coughing and Deep Breathing and Surgical Site Infection Prevention [place] - Preparing for Surgery  Before surgery, you can play an important role.  Because skin is not sterile, your skin needs to be as free of germs as possible.  You can reduce the number of germs on you skin by washing with CHG (chlorahexidine gluconate) soap before surgery.  CHG is an antiseptic cleaner which kills germs and bonds with the skin to continue killing germs even after washing.  Please DO NOT use if you have an allergy to CHG or antibacterial soaps.  If your skin becomes reddened/irritated stop using the CHG and inform your nurse when you arrive at Short Stay.  Do not shave (including legs and underarms) for at least 48 hours prior to the first CHG shower.  You may shave your face.  Please follow these instructions carefully:   1.  Shower with CHG Soap the  night before surgery and the morning of Surgery.  2.  If you choose to wash your hair, wash your hair first as usual with your normal shampoo.  3.  After you shampoo, rinse your hair and body thoroughly to remove the shampoo.  4.  Use CHG as you would any other liquid soap.  You can apply CHG directly to the skin and wash gently with scrungie or a clean washcloth.  5.  Apply the CHG Soap to your body ONLY FROM THE NECK DOWN.  Do not use on open wounds or open sores.  Avoid contact with your eyes, ears, mouth and genitals (private parts).  Wash genitals (private parts) with your normal soap.  6.  Wash thoroughly, paying special attention to the area where your surgery will be performed.  7.  Thoroughly rinse your body with warm water from the neck down.  8.  DO NOT shower/wash with your normal soap after using and rinsing off the CHG Soap.  9.  Pat yourself dry with a clean towel.            10.  Wear clean pajamas.            11.  Place clean sheets on your bed the night of your first shower and  do not sleep with pets.  Day of Surgery  Do not apply any lotions/deoderants the morning of surgery.  Please wear clean clothes to the hospital/surgery center.

## 2014-02-18 NOTE — Progress Notes (Signed)
   Subjective:    Patient ID: [name] [name], male    DOB: [birth year], 78 y.o.   MRN: [id number]  HPI Here for advice about his left great toenail. About one month ago he stubbed this under a door and it bruised up and was painful for a week. Then the swelling went down and the pain resolved. However a week ago the nail began to lift off and now is attached only by one side. He asks what to do about it.    Review of Systems  Constitutional: Negative.   Skin: Positive for wound.       Objective:   Physical Exam  Constitutional: He appears well-developed and well-nourished.  Skin:  The left great toenail has indeed separated from the base and is attached only on one side. He has a new healthy nail growing in beneath this           Assessment & Plan:  I advised him to avoid pulling the nail off but instead letting it come off on its own. He will keep it covered with a Bandaid until that time

## 2014-02-19 ENCOUNTER — Encounter (HOSPITAL_COMMUNITY): Payer: Self-pay

## 2014-02-19 ENCOUNTER — Encounter (HOSPITAL_COMMUNITY)
Admission: RE | Admit: 2014-02-19 | Discharge: 2014-02-19 | Disposition: A | Discharge status: Home / Self Care | Payer: Medicare Other | Source: Ambulatory Visit | Attending: Orthopedic Surgery | Admitting: Orthopedic Surgery

## 2014-02-19 DIAGNOSIS — Z0000 Encounter for general adult medical examination without abnormal findings: Secondary | ICD-10-CM | POA: Insufficient documentation

## 2014-02-19 DIAGNOSIS — K449 Diaphragmatic hernia without obstruction or gangrene: Secondary | ICD-10-CM | POA: Insufficient documentation

## 2014-02-19 DIAGNOSIS — Z8614 Personal history of Methicillin resistant Staphylococcus aureus infection: Secondary | ICD-10-CM | POA: Diagnosis not present

## 2014-02-19 DIAGNOSIS — Z85828 Personal history of other malignant neoplasm of skin: Secondary | ICD-10-CM | POA: Diagnosis not present

## 2014-02-19 DIAGNOSIS — K219 Gastro-esophageal reflux disease without esophagitis: Secondary | ICD-10-CM | POA: Insufficient documentation

## 2014-02-19 DIAGNOSIS — M19011 Primary osteoarthritis, right shoulder: Secondary | ICD-10-CM | POA: Diagnosis not present

## 2014-02-19 DIAGNOSIS — I10 Essential (primary) hypertension: Secondary | ICD-10-CM | POA: Diagnosis not present

## 2014-02-19 DIAGNOSIS — M7541 Impingement syndrome of right shoulder: Secondary | ICD-10-CM | POA: Diagnosis not present

## 2014-02-19 DIAGNOSIS — M179 Osteoarthritis of knee, unspecified: Secondary | ICD-10-CM | POA: Diagnosis not present

## 2014-02-19 DIAGNOSIS — X58XXXA Exposure to other specified factors, initial encounter: Secondary | ICD-10-CM | POA: Diagnosis not present

## 2014-02-19 DIAGNOSIS — S43491A Other sprain of right shoulder joint, initial encounter: Secondary | ICD-10-CM | POA: Diagnosis not present

## 2014-02-19 DIAGNOSIS — I2510 Atherosclerotic heart disease of native coronary artery without angina pectoris: Secondary | ICD-10-CM | POA: Diagnosis not present

## 2014-02-19 DIAGNOSIS — M1610 Unilateral primary osteoarthritis, unspecified hip: Secondary | ICD-10-CM | POA: Diagnosis not present

## 2014-02-19 DIAGNOSIS — Y929 Unspecified place or not applicable: Secondary | ICD-10-CM | POA: Diagnosis not present

## 2014-02-19 LAB — APTT: aPTT: 27 seconds (ref 24–37)

## 2014-02-19 LAB — CBC WITH DIFFERENTIAL/PLATELET
BASOS PCT: 0 % (ref 0–1)
Basophils Absolute: 0 10*3/uL (ref 0.0–0.1)
Eosinophils Absolute: 0.1 10*3/uL (ref 0.0–0.7)
Eosinophils Relative: 2 % (ref 0–5)
HCT: 45.3 % (ref 39.0–52.0)
Hemoglobin: 16.2 g/dL (ref 13.0–17.0)
LYMPHS ABS: 1.5 10*3/uL (ref 0.7–4.0)
Lymphocytes Relative: 21 % (ref 12–46)
MCH: 33.8 pg (ref 26.0–34.0)
MCHC: 35.8 g/dL (ref 30.0–36.0)
MCV: 94.6 fL (ref 78.0–100.0)
MONO ABS: 1.1 10*3/uL — AB (ref 0.1–1.0)
Monocytes Relative: 16 % — ABNORMAL HIGH (ref 3–12)
NEUTROS PCT: 61 % (ref 43–77)
Neutro Abs: 4.2 10*3/uL (ref 1.7–7.7)
PLATELETS: 208 10*3/uL (ref 150–400)
RBC: 4.79 MIL/uL (ref 4.22–5.81)
RDW: 12.7 % (ref 11.5–15.5)
WBC: 6.9 10*3/uL (ref 4.0–10.5)

## 2014-02-19 LAB — COMPREHENSIVE METABOLIC PANEL
ALBUMIN: 4 g/dL (ref 3.5–5.2)
ALT: 27 U/L (ref 0–53)
ANION GAP: 15 (ref 5–15)
AST: 25 U/L (ref 0–37)
Alkaline Phosphatase: 67 U/L (ref 39–117)
BILIRUBIN TOTAL: 0.7 mg/dL (ref 0.3–1.2)
BUN: 19 mg/dL (ref 6–23)
CALCIUM: 9.5 mg/dL (ref 8.4–10.5)
CO2: 24 mEq/L (ref 19–32)
CREATININE: 0.92 mg/dL (ref 0.50–1.35)
Chloride: 101 mEq/L (ref 96–112)
GFR calc Af Amer: >90 mL/min (ref >90)
GFR calc non Af Amer: 80 mL/min — ABNORMAL LOW (ref >90)
Glucose, Bld: 91 mg/dL (ref 70–99)
Potassium: 4.2 mEq/L (ref 3.7–5.3)
Sodium: 140 mEq/L (ref 137–147)
Total Protein: 7.3 g/dL (ref 6.0–8.3)

## 2014-02-19 LAB — PROTIME-INR
INR: 1.02 (ref 0.00–1.49)
PROTHROMBIN TIME: 13.4 s (ref 11.6–15.2)

## 2014-02-19 NOTE — Progress Notes (Signed)
PCP is Dr Sarajane Jews Cardiologist is Dr Johnsie Cancel last visit was 01-08-14  Denies having a recent CXR. EKG noted in epic from 01-08-14 Card cath and Echo noted in epic from 2009. States that Dr Susie Cassette office is working on getting clearance.

## 2014-02-20 NOTE — Progress Notes (Addendum)
Anesthesia Chart Review: Patient is a 78 year old male scheduled for right shoulder arthroscopy with subacromial decompression and distal clavicle resection on 02/27/14 by Dr. Onnie Graham.   History includes former smoker, CAD s/p RCA stent '09, HTN, GERD, hiatal hernia, arthritis, skin cancer, chronic DOE, right THA 04/05/13. PCP is Dr. Sarajane Jews. Cardiologist is Dr. Johnsie Cancel, last visit 01/08/14.  EKG on 01/08/14 showed SR, first degree AVB, PAC's.   Echo on 11/14/07 showed:  - Overall left ventricular systolic function was normal. Left ventricular ejection fraction was estimated to be 65 %. There were no left ventricular regional wall motion abnormalities.  - Normal aortic valve - There was mild mitral valvular regurgitation.  He had an abnormal stress test in 2009 which lead to a cardiac cath on 12/06/07 that showed: normal LM, 40-50% bifurcation at the mid LAD and takeoff of D1, 40-50% ostial D1, normal CX, 90% distal RCA, normal PDA and PLA. He subsequently underwent DES to RCA on 12/07/07.  His last CXR was on 09/28/12, so he is scheduled to get a more recent CXR on the morning of surgery.  Preoperative labs noted.   Dr. Susie Cassette office is contacting Dr. Kyla Balzarine office regarding upcoming plans for surgery.  He was recently seen by Dr. Johnsie Cancel with no new testing ordered, and patient tolerated THA within the past year.  Will leave chart for cardiology input follow-up.  [facility] Short Stay Center/Anesthesiology Phone [phone] 02/20/2014 2:37 PM  Addendum:  I spoke with Abigail Butts at Dr. Susie Cassette office.  She had faxed a letter regarding plans for upcoming surgery to Dr. Kyla Balzarine office, but was necessarily looking for formal cardiology clearance since Dr. Onnie Graham had not requested it. If the anesthesiologist was requiring cardiology clearance then she would pursue further.  I reviewed above with anesthesiologist Dr. Marcie Bal plus Dr. Kyla Balzarine 01/08/14 office note that reported patient's  CAD as "Stable with no agnina and good activity level. Continue medical RX No need for Plavix." He tolerated THA less than one year ago. Patient will be further evaluated by his assigned anesthesiologist on the day of surgery, but if no new CV symptoms then it is anticipated that he can proceed as planned.   [facility] Short Stay Center/Anesthesiology Phone [phone] 02/26/2014 11:52 AM

## 2014-02-21 DIAGNOSIS — Z471 Aftercare following joint replacement surgery: Secondary | ICD-10-CM | POA: Diagnosis not present

## 2014-02-21 DIAGNOSIS — Z96641 Presence of right artificial hip joint: Secondary | ICD-10-CM | POA: Diagnosis not present

## 2014-02-26 MED ORDER — CEFAZOLIN SODIUM-DEXTROSE 2-3 GM-% IV SOLR
2.0000 g | INTRAVENOUS | Status: AC
  Administered 2014-02-27: 2 g via INTRAVENOUS
  Filled 2014-02-26: qty 50

## 2014-02-26 MED ORDER — LACTATED RINGERS IV SOLN
INTRAVENOUS | Status: DC
  Administered 2014-02-27: 50 mL/h via INTRAVENOUS

## 2014-02-26 NOTE — Progress Notes (Addendum)
Message left with Dr Augustin Coupe office surgical scheduler re: input from Dr Johnsie Cancel and asked them to contact us when they get the clearance.

## 2014-02-27 ENCOUNTER — Encounter (HOSPITAL_COMMUNITY): Payer: Self-pay | Admitting: *Deleted

## 2014-02-27 ENCOUNTER — Ambulatory Visit (HOSPITAL_COMMUNITY): Payer: Medicare Other | Admitting: Anesthesiology

## 2014-02-27 ENCOUNTER — Ambulatory Visit (HOSPITAL_COMMUNITY): Payer: Medicare Other

## 2014-02-27 ENCOUNTER — Encounter (HOSPITAL_COMMUNITY): Payer: Medicare Other | Admitting: Vascular Surgery

## 2014-02-27 ENCOUNTER — Encounter (HOSPITAL_COMMUNITY)
Admission: RE | Disposition: A | Discharge status: Home / Self Care | Payer: Self-pay | Source: Ambulatory Visit | Attending: Orthopedic Surgery

## 2014-02-27 ENCOUNTER — Ambulatory Visit (HOSPITAL_COMMUNITY)
Admission: RE | Admit: 2014-02-27 | Discharge: 2014-02-27 | Disposition: A | Discharge status: Home / Self Care | Payer: Medicare Other | Source: Ambulatory Visit | Attending: Orthopedic Surgery | Admitting: Orthopedic Surgery

## 2014-02-27 DIAGNOSIS — X58XXXA Exposure to other specified factors, initial encounter: Secondary | ICD-10-CM | POA: Insufficient documentation

## 2014-02-27 DIAGNOSIS — I2510 Atherosclerotic heart disease of native coronary artery without angina pectoris: Secondary | ICD-10-CM | POA: Diagnosis not present

## 2014-02-27 DIAGNOSIS — M1610 Unilateral primary osteoarthritis, unspecified hip: Secondary | ICD-10-CM | POA: Insufficient documentation

## 2014-02-27 DIAGNOSIS — M179 Osteoarthritis of knee, unspecified: Secondary | ICD-10-CM | POA: Insufficient documentation

## 2014-02-27 DIAGNOSIS — Z8614 Personal history of Methicillin resistant Staphylococcus aureus infection: Secondary | ICD-10-CM | POA: Insufficient documentation

## 2014-02-27 DIAGNOSIS — I10 Essential (primary) hypertension: Secondary | ICD-10-CM | POA: Diagnosis not present

## 2014-02-27 DIAGNOSIS — Z01811 Encounter for preprocedural respiratory examination: Secondary | ICD-10-CM

## 2014-02-27 DIAGNOSIS — M7541 Impingement syndrome of right shoulder: Secondary | ICD-10-CM | POA: Insufficient documentation

## 2014-02-27 DIAGNOSIS — K219 Gastro-esophageal reflux disease without esophagitis: Secondary | ICD-10-CM | POA: Diagnosis not present

## 2014-02-27 DIAGNOSIS — Z01818 Encounter for other preprocedural examination: Secondary | ICD-10-CM | POA: Diagnosis not present

## 2014-02-27 DIAGNOSIS — S43491A Other sprain of right shoulder joint, initial encounter: Secondary | ICD-10-CM | POA: Insufficient documentation

## 2014-02-27 DIAGNOSIS — G8918 Other acute postprocedural pain: Secondary | ICD-10-CM | POA: Diagnosis not present

## 2014-02-27 DIAGNOSIS — M19011 Primary osteoarthritis, right shoulder: Secondary | ICD-10-CM | POA: Insufficient documentation

## 2014-02-27 DIAGNOSIS — Y929 Unspecified place or not applicable: Secondary | ICD-10-CM | POA: Insufficient documentation

## 2014-02-27 SURGERY — SHOULDER ARTHROSCOPY WITH SUBACROMIAL DECOMPRESSION AND DISTAL CLAVICLE EXCISION
Anesthesia: Regional | Site: Shoulder | Laterality: Right

## 2014-02-27 MED ORDER — ROPIVACAINE HCL 5 MG/ML IJ SOLN
INTRAMUSCULAR | Status: DC | PRN
  Administered 2014-02-27: 20 mL via PERINEURAL

## 2014-02-27 MED ORDER — FENTANYL CITRATE 0.05 MG/ML IJ SOLN
INTRAMUSCULAR | Status: DC | PRN
  Administered 2014-02-27: 50 ug via INTRAVENOUS
  Administered 2014-02-27: 25 ug via INTRAVENOUS

## 2014-02-27 MED ORDER — ONDANSETRON HCL 4 MG/2ML IJ SOLN
INTRAMUSCULAR | Status: AC
  Filled 2014-02-27: qty 2

## 2014-02-27 MED ORDER — FENTANYL CITRATE 0.05 MG/ML IJ SOLN
INTRAMUSCULAR | Status: AC
  Administered 2014-02-27: 50 ug
  Filled 2014-02-27: qty 2

## 2014-02-27 MED ORDER — PROPOFOL 10 MG/ML IV BOLUS
INTRAVENOUS | Status: AC
  Filled 2014-02-27: qty 20

## 2014-02-27 MED ORDER — LIDOCAINE HCL (CARDIAC) 20 MG/ML IV SOLN
INTRAVENOUS | Status: DC | PRN
  Administered 2014-02-27: 60 mg via INTRAVENOUS

## 2014-02-27 MED ORDER — LACTATED RINGERS IV SOLN
INTRAVENOUS | Status: DC | PRN
  Administered 2014-02-27 (×2): via INTRAVENOUS

## 2014-02-27 MED ORDER — OXYCODONE-ACETAMINOPHEN 5-325 MG PO TABS: 1.0000 | ORAL_TABLET | ORAL | Status: DC | PRN

## 2014-02-27 MED ORDER — LACTATED RINGERS IV SOLN
INTRAVENOUS | Status: DC
  Administered 2014-02-27: 09:00:00 via INTRAVENOUS

## 2014-02-27 MED ORDER — NEOSTIGMINE METHYLSULFATE 10 MG/10ML IV SOLN
INTRAVENOUS | Status: AC
  Filled 2014-02-27: qty 1

## 2014-02-27 MED ORDER — FENTANYL CITRATE 0.05 MG/ML IJ SOLN: 25.0000 ug | INTRAMUSCULAR | Status: DC | PRN

## 2014-02-27 MED ORDER — GLYCOPYRROLATE 0.2 MG/ML IJ SOLN
INTRAMUSCULAR | Status: AC
  Filled 2014-02-27: qty 2

## 2014-02-27 MED ORDER — OXYCODONE HCL 5 MG/5ML PO SOLN: 5.0000 mg | Freq: Once | ORAL | Status: DC | PRN

## 2014-02-27 MED ORDER — ROCURONIUM BROMIDE 50 MG/5ML IV SOLN
INTRAVENOUS | Status: AC
  Filled 2014-02-27: qty 1

## 2014-02-27 MED ORDER — DEXAMETHASONE SODIUM PHOSPHATE 4 MG/ML IJ SOLN
INTRAMUSCULAR | Status: DC | PRN
  Administered 2014-02-27: 4 mg via INTRAVENOUS

## 2014-02-27 MED ORDER — SODIUM CHLORIDE 0.9 % IJ SOLN
INTRAMUSCULAR | Status: AC
  Filled 2014-02-27: qty 10

## 2014-02-27 MED ORDER — ONDANSETRON HCL 4 MG/2ML IJ SOLN
INTRAMUSCULAR | Status: DC | PRN
  Administered 2014-02-27: 4 mg via INTRAVENOUS

## 2014-02-27 MED ORDER — GLYCOPYRROLATE 0.2 MG/ML IJ SOLN
INTRAMUSCULAR | Status: DC | PRN
  Administered 2014-02-27: 0.4 mg via INTRAVENOUS
  Administered 2014-02-27: 0.2 mg via INTRAVENOUS

## 2014-02-27 MED ORDER — OXYCODONE HCL 5 MG PO TABS: 5.0000 mg | ORAL_TABLET | Freq: Once | ORAL | Status: DC | PRN

## 2014-02-27 MED ORDER — ARTIFICIAL TEARS OP OINT
TOPICAL_OINTMENT | OPHTHALMIC | Status: AC
  Filled 2014-02-27: qty 3.5

## 2014-02-27 MED ORDER — FENTANYL CITRATE 0.05 MG/ML IJ SOLN
INTRAMUSCULAR | Status: AC
  Filled 2014-02-27: qty 5

## 2014-02-27 MED ORDER — SODIUM CHLORIDE 0.9 % IR SOLN
Status: DC | PRN
  Administered 2014-02-27: 6000 mL

## 2014-02-27 MED ORDER — PROPOFOL 10 MG/ML IV BOLUS
INTRAVENOUS | Status: DC | PRN
  Administered 2014-02-27: 20 mg via INTRAVENOUS
  Administered 2014-02-27: 140 mg via INTRAVENOUS

## 2014-02-27 MED ORDER — NEOSTIGMINE METHYLSULFATE 10 MG/10ML IV SOLN
INTRAVENOUS | Status: DC | PRN
  Administered 2014-02-27: 3 mg via INTRAVENOUS

## 2014-02-27 MED ORDER — ROCURONIUM BROMIDE 100 MG/10ML IV SOLN
INTRAVENOUS | Status: DC | PRN
  Administered 2014-02-27: 40 mg via INTRAVENOUS

## 2014-02-27 MED ORDER — MIDAZOLAM HCL 2 MG/2ML IJ SOLN
INTRAMUSCULAR | Status: AC
  Administered 2014-02-27: 1 mg
  Filled 2014-02-27: qty 2

## 2014-02-27 MED ORDER — DIAZEPAM 5 MG PO TABS: 2.5000 mg | ORAL_TABLET | Freq: Four times a day (QID) | ORAL | Status: DC | PRN

## 2014-02-27 MED ORDER — CHLORHEXIDINE GLUCONATE 4 % EX LIQD
60.0000 mL | Freq: Once | CUTANEOUS | Status: DC
  Filled 2014-02-27: qty 60

## 2014-02-27 SURGICAL SUPPLY — 58 items
BLADE CUTTER GATOR 3.5 (BLADE) ×2 IMPLANT
BLADE GREAT WHITE 4.2 (BLADE) ×2 IMPLANT
BLADE SURG 11 STRL SS (BLADE) ×2 IMPLANT
BOOTCOVER CLEANROOM LRG (PROTECTIVE WEAR) ×4 IMPLANT
BUR 3.5 LG SPHERICAL (BURR) ×1 IMPLANT
BUR OVAL 4.0 (BURR) ×2 IMPLANT
BURR 3.5 LG SPHERICAL (BURR) ×2
CANISTER SUCT LVC 12 LTR MEDI- (MISCELLANEOUS) ×2 IMPLANT
CANNULA ACUFLEX KIT 5X76 (CANNULA) ×2 IMPLANT
CANNULA DRILOCK 5.0X75 (CANNULA) IMPLANT
CLSR STERI-STRIP ANTIMIC 1/2X4 (GAUZE/BANDAGES/DRESSINGS) ×2 IMPLANT
CONNECTOR 5 IN 1 STRAIGHT STRL (MISCELLANEOUS) ×2 IMPLANT
DRAPE INCISE 23X17 IOBAN STRL (DRAPES) ×1
DRAPE INCISE IOBAN 23X17 STRL (DRAPES) ×1 IMPLANT
DRAPE INCISE IOBAN 66X45 STRL (DRAPES) ×2 IMPLANT
DRAPE STERI 35X30 U-POUCH (DRAPES) ×2 IMPLANT
DRAPE SURG 17X11 SM STRL (DRAPES) ×2 IMPLANT
DRAPE U-SHAPE 47X51 STRL (DRAPES) ×2 IMPLANT
DRSG PAD ABDOMINAL 8X10 ST (GAUZE/BANDAGES/DRESSINGS) ×2 IMPLANT
DURAPREP 26ML APPLICATOR (WOUND CARE) ×4 IMPLANT
ELECT REM PT RETURN 9FT ADLT (ELECTROSURGICAL) ×2
ELECTRODE REM PT RTRN 9FT ADLT (ELECTROSURGICAL) ×1 IMPLANT
GAUZE SPONGE 4X4 12PLY STRL (GAUZE/BANDAGES/DRESSINGS) ×2 IMPLANT
GLOVE BIO SURGEON STRL SZ7.5 (GLOVE) ×2 IMPLANT
GLOVE BIO SURGEON STRL SZ8 (GLOVE) ×2 IMPLANT
GLOVE EUDERMIC 7 POWDERFREE (GLOVE) ×2 IMPLANT
GLOVE SS BIOGEL STRL SZ 7.5 (GLOVE) ×1 IMPLANT
GLOVE SUPERSENSE BIOGEL SZ 7.5 (GLOVE) ×1
GOWN STRL REUS W/ TWL LRG LVL3 (GOWN DISPOSABLE) ×1 IMPLANT
GOWN STRL REUS W/ TWL XL LVL3 (GOWN DISPOSABLE) ×4 IMPLANT
GOWN STRL REUS W/TWL LRG LVL3 (GOWN DISPOSABLE) ×1
GOWN STRL REUS W/TWL XL LVL3 (GOWN DISPOSABLE) ×4
KIT BASIN OR (CUSTOM PROCEDURE TRAY) ×2 IMPLANT
KIT ROOM TURNOVER OR (KITS) ×2 IMPLANT
KIT SHOULDER TRACTION (DRAPES) ×2 IMPLANT
MANIFOLD NEPTUNE II (INSTRUMENTS) ×2 IMPLANT
NDL SUT 6 .5 CRC .975X.05 MAYO (NEEDLE) ×1 IMPLANT
NEEDLE MAYO TAPER (NEEDLE) ×1
NEEDLE SPNL 18GX3.5 QUINCKE PK (NEEDLE) ×2 IMPLANT
NS IRRIG 1000ML POUR BTL (IV SOLUTION) ×2 IMPLANT
PACK SHOULDER (CUSTOM PROCEDURE TRAY) ×2 IMPLANT
PAD ARMBOARD 7.5X6 YLW CONV (MISCELLANEOUS) ×4 IMPLANT
SET ARTHROSCOPY TUBING (MISCELLANEOUS) ×1
SET ARTHROSCOPY TUBING LN (MISCELLANEOUS) ×1 IMPLANT
SLING ARM LRG ADULT FOAM STRAP (SOFTGOODS) IMPLANT
SLING ARM MED ADULT FOAM STRAP (SOFTGOODS) ×2 IMPLANT
SPONGE LAP 4X18 X RAY DECT (DISPOSABLE) ×2 IMPLANT
STRIP CLOSURE SKIN 1/2X4 (GAUZE/BANDAGES/DRESSINGS) ×2 IMPLANT
SUT MNCRL AB 3-0 PS2 18 (SUTURE) ×2 IMPLANT
SUT PDS AB 0 CT 36 (SUTURE) IMPLANT
SUT RETRIEVER GRASP 30 DEG (SUTURE) IMPLANT
SYR 20CC LL (SYRINGE) ×2 IMPLANT
TAPE CLOTH 4X10 WHT NS (GAUZE/BANDAGES/DRESSINGS) ×2 IMPLANT
TAPE PAPER 3X10 WHT MICROPORE (GAUZE/BANDAGES/DRESSINGS) ×2 IMPLANT
TOWEL OR 17X24 6PK STRL BLUE (TOWEL DISPOSABLE) ×2 IMPLANT
TOWEL OR 17X26 10 PK STRL BLUE (TOWEL DISPOSABLE) ×2 IMPLANT
WAND SUCTION MAX 4MM 90S (SURGICAL WAND) ×2 IMPLANT
WATER STERILE IRR 1000ML POUR (IV SOLUTION) ×2 IMPLANT

## 2014-02-27 NOTE — Op Note (Signed)
NAMEFRANK, NOVELO NO.:  [id number]  MEDICAL RECORD NO.:  [id number]  LOCATION:  [facility]                         FACILITY:  [facility]  PHYSICIAN:  Metta Clines. Yana Schorr, M.D.  DATE OF BIRTH:  [birth year]  DATE OF PROCEDURE:  02/27/2014 DATE OF DISCHARGE:  02/27/2014                              OPERATIVE REPORT   PREOPERATIVE DIAGNOSIS: 1. Chronic right shoulder impingement syndrome. 2. 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  POSTOPERATIVE DIAGNOSIS: 1. Chronic right shoulder impingement syndrome. 2. 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 3. Right shoulder extensive degenerative labral tear.  PROCEDURE: 1. Right shoulder examination under anesthesia. 2. Right shoulder glenohumeral joint diagnostic arthroscopy. 3. Debridement of extensive degenerative labral tear. 4. Arthroscopic subacromial decompression and bursectomy. 5. Arthroscopic distal clavicle resection.  SURGEON:  Metta Clines. Rieley Khalsa, MD  ASSISTANT:  Reather Laurence. Shuford, PA-C  ANESTHESIA:  General endotracheal as well as an interscalene block.  ESTIMATED BLOOD LOSS:  None.  DRAINS:  None.  HISTORY:  Ms. Lomax is a 78 year old gentleman who has had chronic progressive increasing right shoulder pain with impingement syndrome and persistent functional limitations despite prolonged attempts at conservative management.  His preoperative x-rays confirming AC joint arthropathy and an MRI scan shows tendinopathy of the rotator cuff, but no obvious discrete tears.  Due to his ongoing pain and functional limitations and failure to respond to conservative management, he was brought to the operating room at this time for planned right shoulder arthroscopy as described below.  Preoperatively, I counseled Ms. Beigel on treatment options as well as risks versus benefits thereof.  Possible surgical complications were reviewed including potential for bleeding,  infection, neurovascular injury, persistent pain, loss of motion, anesthetic complication, and possible need for additional surgery.  He understands and accepts and agrees to planned procedure.  PROCEDURE IN DETAIL:  After undergoing routine preop evaluation, the patient received prophylactic antibiotics.  An interscalene block was established in the holding area by the Anesthesia Department.  Placed supine on the operating table, underwent smooth induction of a general endotracheal anesthesia.  Turned the left lateral decubitus position on a beanbag and rolled and padded and protected.  Right shoulder examination under general anesthesia revealed full motion.  No instability patterns noted.  Arm was suspended 70 degrees of abduction with 10 pounds of traction.  Right shoulder girdle region was sterilely prepped and draped in standard fashion.  Time-out was called.  Posterior portal was established in the glenohumeral joint and anterior portal was established under direct visualization.  Articular surface was found to be in good condition.  There was degenerative tearing of the posterior and posterior superior and superior labrum.  All these areas were debrided with shaver back to stable margin.  The biceps anchor stable. Biceps had normal caliber.  No instability patterns were noted.  Rotator cuff showed some articular-sided fraying which was debrided and I would estimate that this partial tearing was no more than perhaps 5% of the thickness of the tendon.  At this point, irrigation and debridement was completed within the glenohumeral joint.  Fluid was removed.  The arm was dropped down to 30 degrees of abduction with the arthroscope, introduced into the subacromial space  and the posterior portal and direct lateral portal established in subacromial space.  Abundant dense bursal tissue, multiple adhesions were encountered and these were all divided and excised with combination of shaver  and Stryker wand.  The wand was then used to remove the periosteum from the undersurface of the anterior half of the acromion and the subacromial decompression was performed with a bur.  Portal was established directly into the distal clavicle and distal dissection performed with a bur.  Care was taken to confirm visualization of entire circumference of the distal clavicle to ensure adequate removal of bone.  We then completed a subacromial/subdeltoid bursectomy.  Bursal surface rotator cuff showed some superficial fraying, but no defects.  Bursectomy was completed. Hemostasis was obtained.  Fluid was removed.  Portal was closed with Monocryl and Steri-Strips.  Dry dressing taped to the right shoulder, right arm was placed in sling.  The patient was awakened, extubated, and taken to recovery room in stable condition.     Metta Clines. Yvonna Brun, M.D.     KMS/MEDQ  D:  02/27/2014  T:  02/27/2014  Job:  [id number]

## 2014-02-27 NOTE — Op Note (Signed)
02/27/2014  11:30 AM  PATIENT:   [name] [name]  78 y.o. male  PRE-OPERATIVE DIAGNOSIS:  RIGHT SHOULDER IMPINGEMENT AC JOINT OA  POST-OPERATIVE DIAGNOSIS:  SAME WITH DEGENERATIVE LABRAL TEAR  PROCEDURE:  RSA, labral debridement, SAD, DCR  SURGEON:  Tajha Sammarco, Metta Clines. M.D.  ASSISTANTS: Shuford pac   ANESTHESIA:   GET + ISB  EBL: min  SPECIMEN:  none  Drains: none   PATIENT DISPOSITION:  PACU - hemodynamically stable.    PLAN OF CARE: Discharge to home after PACU  Dictation# [id number]

## 2014-02-27 NOTE — Anesthesia Postprocedure Evaluation (Signed)
  Anesthesia Post-op Note  Patient: [name] [name]  Procedure(s) Performed: Procedure(s): RIGHT SHOULDER ARTHROSCOPY WITH SUBACROMIAL DECOMPRESSION AND DISTAL CLAVICLE RESSECTION  (Right)  Patient Location: PACU  Anesthesia Type:General and Regional  Level of Consciousness: awake and alert   Airway and Oxygen Therapy: Patient Spontanous Breathing  Post-op Pain: none  Post-op Assessment: Post-op Vital signs reviewed, Patient's Cardiovascular Status Stable, Respiratory Function Stable, Patent Airway, No signs of Nausea or vomiting and Pain level controlled  Post-op Vital Signs: Reviewed and stable  Last Vitals:  Filed Vitals:   02/27/14 1200  BP: 114/59  Pulse: 43  Temp: 36.2 C  Resp: 23    Complications: No apparent anesthesia complications

## 2014-02-27 NOTE — Discharge Instructions (Signed)
° °Kevin M. Supple, M.D., F.A.A.O.S. °Orthopaedic Surgery °Specializing in Arthroscopic and Reconstructive °Surgery of the Shoulder and Knee °[phone] °[address] - [place],  [address] - Fax [phone] ° ° °POST-OP SHOULDER ARTHROSCOPY INSTRUCTIONS ° °1. Call the office at [phone] to schedule your first post-op appointment 7-10 days from the date of your surgery. ° °2. Leave the steri-strips in place over your incisions when performing dressing changes and showering. You may remove your dressings and begin showering 72 hours from surgery. You can expect drainage that is clear to bloody in nature that occasionally will soak through your dressings. If this occurs go ahead and perform a dressing change. The drainage should lessen daily and when there is no drainage from your incisions feel free to go without a dressing. ° °3. Wear your sling for comfort. You may come out of your sling for ad lib activity and even decide not to use the sling at all. If you find you are more comfortable in your sling, make sure you come out of your sling at least 3-4 times a day to do the exercises that are included below. ° °4. Range of motion to your elbow, wrist, and hand are encouraged 3-5 times daily. Exercise to your hand and fingers helps to reduce swelling you may experience. ° °5. Utilize ice to the shoulder 3-4 times minimum a day and additionally if you are experiencing pain. ° °6. You may drive when safely off narcotics and muscle relaxants. ° °7. If you had a block pre-operatively to provide post-op pain relief you may want to go ahead and begin utilizing your pain meds as your arm begins to wake up. Blocks can sometimes last up to 16-18 hours. If you are still pain-free prior to going to bed you may want to strongly consider taking a pain medication to avoid being awakened in the night with the onset of pain. A muscle relaxant is also provided for you should you experience muscle spasms. It  is recommended that if you are experiencing pain that your pain medication alone is not controlling, add the muscle relaxant along with the pain medication which can give additional pain relief. The first one to two days is generally the most severe of your pain and then should gradually decrease. As your pain lessens it is recommended that you decrease your use of the pain medications to an "as needed basis" only and to always comply with the recommended dosages of the pain medications. ° °8. Pain medications can produce constipation along with their use. If you experience this, the use of an over the counter stool softener or laxative daily is recommended.  ° °9. For additional questions or concerns, please do not hesitate to call the office. If after hours there is an answering service to forward your concerns to the physician on call. ° ° °POST-OP EXERCISES ° °The pendulum exercises should be performed while bending at the waist as far over as possible thereby letting gravity do the work for you. ° °Range of Motion Exercises: Pendulum (circular) ° °Repeat 20 times. Do 3 sessions per day. ° ° ° ° °Range of Motion Exercises: Pendulum (side-to-side) ° °Repeat 20 times. Do 3 sessions per day. ° ° ° °Range of Motion Exercises (self-stretching activities): ° °Slide arm up wall with palm toward you, moving closer to the wall. Hold for 5 seconds. ° °Repeat 10 times. Do 3 sessions per day. ° ° ° ° ° °What to eat: ° °For your   first meals, you should eat lightly; only small meals initially.  If you do not have nausea, you may eat larger meals.  Avoid spicy, greasy and heavy food.   ° °General Anesthesia, Adult, Care After  °Refer to this sheet in the next few weeks. These instructions provide you with information on caring for yourself after your procedure. Your health care provider may also give you more specific instructions. Your treatment has been planned according to current medical practices, but problems sometimes  occur. Call your health care provider if you have any problems or questions after your procedure.  °WHAT TO EXPECT AFTER THE PROCEDURE  °After the procedure, it is typical to experience:  °Sleepiness.  °Nausea and vomiting. °HOME CARE INSTRUCTIONS  °For the first 24 hours after general anesthesia:  °Have a responsible person with you.  °Do not drive a car. If you are alone, do not take public transportation.  °Do not drink alcohol.  °Do not take medicine that has not been prescribed by your health care provider.  °Do not sign important papers or make important decisions.  °You may resume a normal diet and activities as directed by your health care provider.  °Change bandages (dressings) as directed.  °If you have questions or problems that seem related to general anesthesia, call the hospital and ask for the anesthetist or anesthesiologist on call. °SEEK MEDICAL CARE IF:  °You have nausea and vomiting that continue the day after anesthesia.  °You develop a rash. °SEEK IMMEDIATE MEDICAL CARE IF:  °You have difficulty breathing.  °You have chest pain.  °You have any allergic problems. °Document Released: 08/08/2000 Document Revised: 01/02/2013 Document Reviewed: 11/15/2012  °Exit[employer]® Patient Information ©2014 [employer].  ° ° °

## 2014-02-27 NOTE — Transfer of Care (Signed)
Immediate Anesthesia Transfer of Care Note  Patient: [name] [name]  Procedure(s) Performed: Procedure(s): RIGHT SHOULDER ARTHROSCOPY WITH SUBACROMIAL DECOMPRESSION AND DISTAL CLAVICLE RESSECTION  (Right)  Patient Location: PACU  Anesthesia Type:GA combined with regional for post-op pain  Level of Consciousness: awake, alert  and oriented  Airway & Oxygen Therapy: Patient Spontanous Breathing and Patient connected to face mask oxygen  Post-op Assessment: Report given to PACU RN  Post vital signs: Reviewed and stable  Complications: No apparent anesthesia complications

## 2014-02-27 NOTE — Anesthesia Preprocedure Evaluation (Addendum)
Anesthesia Evaluation  Patient identified by MRN, date of birth, ID band Patient awake    Reviewed: Allergy & Precautions, H&P , NPO status , Patient's Chart, lab work & pertinent test results  History of Anesthesia Complications Negative for: history of anesthetic complications  Airway Mallampati: II TM Distance: >3 FB Neck ROM: Full    Dental  (+) Teeth Intact   Pulmonary neg shortness of breath, neg sleep apnea, neg COPDneg recent URI, former smoker,  breath sounds clear to auscultation        Cardiovascular hypertension, Pt. on medications and Pt. on home beta blockers - angina+ CAD and + Cardiac Stents - DOE Rhythm:Regular     Neuro/Psych negative neurological ROS  negative psych ROS   GI/Hepatic Neg liver ROS, hiatal hernia, GERD-  Controlled,  Endo/Other  negative endocrine ROS  Renal/GU negative Renal ROS     Musculoskeletal  (+) Arthritis -,   Abdominal   Peds  Hematology negative hematology ROS (+)   Anesthesia Other Findings Patient is a 78 year old male scheduled for right shoulder arthroscopy with subacromial decompression and distal clavicle resection on 02/27/14 by Dr. Onnie Graham.    History includes former smoker, CAD s/p RCA stent '09, HTN, GERD, hiatal hernia, arthritis, skin cancer, chronic DOE, right THA 04/05/13. PCP is Dr. Sarajane Jews. Cardiologist is Dr. Johnsie Cancel, last visit 01/08/14.   EKG on 01/08/14 showed SR, first degree AVB, PAC's.    Echo on 11/14/07 showed:   - Overall left ventricular systolic function was normal. Left ventricular ejection fraction was estimated to be 65 %. There were no left ventricular regional wall motion abnormalities.   - Normal aortic valve - There was mild mitral valvular regurgitation.   He had an abnormal stress test in 2009 which lead to a cardiac cath on 12/06/07 that showed: normal LM, 40-50% bifurcation at the mid LAD and takeoff of D1, 40-50% ostial D1, normal CX, 90%  distal RCA, normal PDA and PLA. He subsequently underwent DES to RCA on 12/07/07.   His last CXR was on 09/28/12, so he is scheduled to get a more recent CXR on the morning of surgery.   Preoperative labs noted.    Dr. Susie Cassette office is contacting Dr. Kyla Balzarine office regarding upcoming plans for surgery.  He was recently seen by Dr. Johnsie Cancel with no new testing ordered, and patient tolerated THA within the past year.  Will leave chart for cardiology input follow-up.   [facility] Short Stay Center/Anesthesiology Phone [phone] 02/20/2014 2:37 PM   Addendum:   I spoke with Abigail Butts at Dr. Susie Cassette office.  She had faxed a letter regarding plans for upcoming surgery to Dr. Kyla Balzarine office, but was necessarily looking for formal cardiology clearance since Dr. Onnie Graham had not requested it. If the anesthesiologist was requiring cardiology clearance then she would pursue further.  I reviewed above with anesthesiologist Dr. Marcie Bal plus Dr. Kyla Balzarine 01/08/14 office note that reported patient's CAD as "Stable with no agnina and good activity level. Continue medical RX No need for Plavix." He tolerated THA less than one year ago. Patient will be further evaluated by his assigned anesthesiologist on the day of surgery, but if no new CV symptoms then it is anticipated that he can proceed as planned.      Reproductive/Obstetrics                         Anesthesia Physical Anesthesia Plan  ASA: III  Anesthesia  Plan: General and Regional   Post-op Pain Management: MAC Combined w/ Regional for Post-op pain   Induction: Intravenous  Airway Management Planned: Oral ETT  Additional Equipment: None  Intra-op Plan:   Post-operative Plan: Extubation in OR  Informed Consent: I have reviewed the patients History and Physical, chart, labs and discussed the procedure including the risks, benefits and alternatives for the proposed anesthesia with the patient or  authorized representative who has indicated his/her understanding and acceptance.   Dental advisory given  Plan Discussed with: CRNA, Anesthesiologist and Surgeon  Anesthesia Plan Comments:        Anesthesia Quick Evaluation

## 2014-02-27 NOTE — H&P (Signed)
[place]    Chief Complaint: RIGHT SHOULDER IMPINGEMENT  HPI: The patient is a 78 y.o. 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  Past Medical History  Diagnosis Date  . Abscess of groin, left   . Hx MRSA infection     after heart surgery several yrs ago  . Coronary artery disease   . Hypertension   . Abnormal stress test   . Chest pressure   . H/O hiatal hernia   . Gastroesophageal reflux disease with hiatal hernia     "shocsky ring", no longer using Rx  . Cancer     basal cell, history of   . Arthritis     R hip & knees    Past Surgical History  Procedure Laterality Date  . Cardiac catheterization  12/06/07    60%  . Appendectomy    . Hernia repair    . Knee arthroscopy Right 10/03/2012    Procedure: RIGHT KNEEE ARTHROSCOPY WITH DEBRIDEMENT ;  Surgeon: Gearlean Alf, MD;  Location: [facility];  Service: Orthopedics;  Laterality: Right;  . Yag laser application      for glaucoma - both eyes, no drops as of yet   . Total hip arthroplasty Right 04/05/2013    Procedure: RIGHT TOTAL HIP ARTHROPLASTY ANTERIOR APPROACH;  Surgeon: Gearlean Alf, MD;  Location: [place];  Service: Orthopedics;  Laterality: Right;  . Colonoscopy    . Coronary angioplasty with stent placement      Family History  Problem Relation Age of Onset  . Stomach cancer      family hx  . Hypertension      family hx  . Heart disease      family hx  . Hypertension Sister   . Hypertension Brother     Social History:  reports that he quit smoking about 52 years ago. He has never used smokeless tobacco. He reports that he drinks alcohol. He reports that he does not use illicit drugs.  Allergies: No Known Allergies  Medications Prior to Admission  Medication Sig Dispense Refill  . aspirin 325 MG tablet Take 325 mg by mouth every morning.       . benazepril-hydrochlorthiazide (LOTENSIN HCT) 20-25 MG per tablet Take 1 tablet by mouth every  morning.  30 tablet  6  . carvedilol (COREG) 6.25 MG tablet Take 0.5 tablets (3.125 mg total) by mouth 2 (two) times daily with a meal.  30 tablet  6  . Multiple Vitamin (MULTIVITAMIN) tablet Take 1 tablet by mouth daily.      . rosuvastatin (CRESTOR) 20 MG tablet Take 20 mg by mouth every other day.      . nitroGLYCERIN (NITROSTAT) 0.4 MG SL tablet Place 1 tablet (0.4 mg total) under the tongue every 5 (five) minutes as needed for chest pain.  25 tablet  0     Physical Exam: right shoulder with painful and restricted motion as noted at recent office visits  Vitals  Temp:  [97.9 F (36.6 C)] 97.9 F (36.6 C) (10/15 0823) Pulse Rate:  [56] 56 (10/15 0823) Resp:  [20] 20 (10/15 0823) BP: (133)/(75) 133/75 mmHg (10/15 0823) SpO2:  [100 %] 100 % (10/15 0823) Weight:  [71.668 kg (158 lb)] 71.668 kg (158 lb) (10/15 0823)  Assessment/Plan  Impression: RIGHT SHOULDER IMPINGEMENT   Plan of Action: Procedure(s): RIGHT SHOULDER ARTHROSCOPY WITH SUBACROMIAL DECOMPRESSION AND DISTAL CLAVICLE RESSECTION   Nilo Fallin M 02/27/2014, 9:22 AM

## 2014-02-27 NOTE — Anesthesia Procedure Notes (Signed)
Anesthesia Regional Block:  Interscalene brachial plexus block  Pre-Anesthetic Checklist: ,, timeout performed, Correct Patient, Correct Site, Correct Laterality, Correct Procedure, Correct Position, site marked, Risks and benefits discussed,  Surgical consent,  Pre-op evaluation,  At surgeon's request and post-op pain management  Laterality: Upper and Right  Prep: chloraprep       Needles:  Injection technique: Single-shot  Needle Type: Echogenic Stimulator Needle          Additional Needles:  Procedures: ultrasound guided (picture in chart) and nerve stimulator Interscalene brachial plexus block  Nerve Stimulator or Paresthesia:  Response: deltoid, 0.5 mA,   Additional Responses:   Narrative:  Start time: 02/27/2014 9:43 AM End time: 02/27/2014 9:52 AM Injection made incrementally with aspirations every 5 mL.  Performed by: Personally  Anesthesiologist: Seymore Brodowski  Additional Notes: H+P and labs reviewed, risks and benefits discussed with patient, procedure tolerated well without complications

## 2014-02-28 NOTE — Op Note (Signed)
NAMERAKESH, DUTKO NO.:  [id number]  MEDICAL RECORD NO.:  [id number]  LOCATION:                                 FACILITY:  PHYSICIAN:  Metta Clines. Darron Stuck, M.D.  DATE OF BIRTH:  [birth year]  DATE OF PROCEDURE:  02/27/2014 DATE OF DISCHARGE:  02/27/2014                              OPERATIVE REPORT   ADDENDUM  Jenetta Loges, PA-C, was used as an Environmental consultant throughout the case, essential for help with positioning the patient, positioning the extremity, management of the arthroscope equipment, tissue manipulation, wound closure, and intraoperative decision making.     Metta Clines. Owin Vignola, M.D.     KMS/MEDQ  D:  02/27/2014  T:  02/27/2014  Job:  [id number]

## 2014-03-07 DIAGNOSIS — Z4789 Encounter for other orthopedic aftercare: Secondary | ICD-10-CM | POA: Diagnosis not present

## 2014-03-07 DIAGNOSIS — M7541 Impingement syndrome of right shoulder: Secondary | ICD-10-CM | POA: Diagnosis not present

## 2014-03-10 DIAGNOSIS — M7541 Impingement syndrome of right shoulder: Secondary | ICD-10-CM | POA: Diagnosis not present

## 2014-03-12 DIAGNOSIS — M7541 Impingement syndrome of right shoulder: Secondary | ICD-10-CM | POA: Diagnosis not present

## 2014-03-17 DIAGNOSIS — M7541 Impingement syndrome of right shoulder: Secondary | ICD-10-CM | POA: Diagnosis not present

## 2014-03-19 DIAGNOSIS — M7541 Impingement syndrome of right shoulder: Secondary | ICD-10-CM | POA: Diagnosis not present

## 2014-03-24 DIAGNOSIS — M7541 Impingement syndrome of right shoulder: Secondary | ICD-10-CM | POA: Diagnosis not present

## 2014-03-26 DIAGNOSIS — M7541 Impingement syndrome of right shoulder: Secondary | ICD-10-CM | POA: Diagnosis not present

## 2014-04-01 DIAGNOSIS — M7541 Impingement syndrome of right shoulder: Secondary | ICD-10-CM | POA: Diagnosis not present

## 2014-04-03 DIAGNOSIS — M7541 Impingement syndrome of right shoulder: Secondary | ICD-10-CM | POA: Diagnosis not present

## 2014-04-14 DIAGNOSIS — M7541 Impingement syndrome of right shoulder: Secondary | ICD-10-CM | POA: Diagnosis not present

## 2014-04-18 DIAGNOSIS — M7541 Impingement syndrome of right shoulder: Secondary | ICD-10-CM | POA: Diagnosis not present

## 2014-04-21 DIAGNOSIS — M7541 Impingement syndrome of right shoulder: Secondary | ICD-10-CM | POA: Diagnosis not present

## 2014-04-23 DIAGNOSIS — M7541 Impingement syndrome of right shoulder: Secondary | ICD-10-CM | POA: Diagnosis not present

## 2014-04-30 DIAGNOSIS — M7541 Impingement syndrome of right shoulder: Secondary | ICD-10-CM | POA: Diagnosis not present

## 2014-05-05 DIAGNOSIS — M7541 Impingement syndrome of right shoulder: Secondary | ICD-10-CM | POA: Diagnosis not present

## 2014-06-27 DIAGNOSIS — H527 Unspecified disorder of refraction: Secondary | ICD-10-CM | POA: Diagnosis not present

## 2014-06-27 DIAGNOSIS — H1851 Endothelial corneal dystrophy: Secondary | ICD-10-CM | POA: Diagnosis not present

## 2014-06-27 DIAGNOSIS — H2511 Age-related nuclear cataract, right eye: Secondary | ICD-10-CM | POA: Diagnosis not present

## 2014-06-27 DIAGNOSIS — H25011 Cortical age-related cataract, right eye: Secondary | ICD-10-CM | POA: Diagnosis not present

## 2014-07-08 NOTE — Progress Notes (Signed)
Patient ID: [name] [name], male   DOB: [birth year], 79 y.o.   MRN: [id number] [name] [name] seen today in followup for his coronary disease. In July 2009 he had an abnormal Myoview and had stenting of the right coronary artery. He is enrolled in the [employer] research trial. His been compliant with his medications. He is an Probation officer. He is also active with his  grand children Had arthroscopic shoulder surgery by Dr [name] [name] 10/15 with no cardiac issues  Dr. Sarajane [name] his primary care doctor. There's been no chest pain palpitations PND or orthopnea  ROS: Denies fever, malais, weight loss, blurry vision, decreased visual acuity, cough, sputum, SOB, hemoptysis, pleuritic pain, palpitaitons, heartburn, abdominal pain, melena, lower extremity edema, claudication, or rash.  All other systems reviewed and negative  General: Affect appropriate Healthy:  appears stated age 79: normal Neck supple with no adenopathy JVP normal no bruits no thyromegaly Lungs clear with no wheezing and good diaphragmatic motion Heart:  S1/S2 no murmur, no rub, gallop or click PMI normal Abdomen: benighn, BS positve, no tenderness, no AAA no bruit.  No HSM or HJR Distal pulses intact with no bruits No edema Neuro non-focal Skin warm and dry No muscular weakness  S/P right hip surgery anterior approach   Current Outpatient Prescriptions  Medication Sig Dispense Refill  . aspirin 325 MG tablet Take 325 mg by mouth every morning.     . benazepril-hydrochlorthiazide (LOTENSIN HCT) 20-25 MG per tablet Take 1 tablet by mouth every morning. 30 tablet 6  . carvedilol (COREG) 6.25 MG tablet Take 0.5 tablets (3.125 mg total) by mouth 2 (two) times daily with a meal. 30 tablet 6  . Multiple Vitamin (MULTIVITAMIN) tablet Take 1 tablet by mouth daily.    . nitroGLYCERIN (NITROSTAT) 0.4 MG SL tablet Place 1 tablet (0.4 mg total) under the tongue every 5 (five) minutes as needed for chest pain. 25 tablet 0  . rosuvastatin (CRESTOR) 20 MG  tablet Take 20 mg by mouth every other day.     No current facility-administered medications for this visit.    Allergies  Review of patient's allergies indicates no known allergies.  Electrocardiogram:  SR rate 68  PR 258 otherwise normal 2014   SR rate 60  PR 276 PAC minor change in PR since 2014 10/15  Assessment and Plan

## 2014-07-09 ENCOUNTER — Encounter: Payer: Self-pay | Admitting: Cardiovascular Disease

## 2014-07-09 ENCOUNTER — Ambulatory Visit (INDEPENDENT_AMBULATORY_CARE_PROVIDER_SITE_OTHER): Payer: Medicare Other | Admitting: Cardiovascular Disease

## 2014-07-09 VITALS — BP 130/74 | HR 53 | Ht 70.0 in | Wt 159.1 lb

## 2014-07-09 DIAGNOSIS — E785 Hyperlipidemia, unspecified: Secondary | ICD-10-CM | POA: Diagnosis not present

## 2014-07-09 MED ORDER — ROSUVASTATIN CALCIUM 20 MG PO TABS: 20.0000 mg | ORAL_TABLET | ORAL | Status: DC

## 2014-07-09 MED ORDER — BENAZEPRIL-HYDROCHLOROTHIAZIDE 20-25 MG PO TABS: 1.0000 | ORAL_TABLET | Freq: Every morning | ORAL | Status: DC

## 2014-07-09 MED ORDER — NITROGLYCERIN 0.4 MG SL SUBL: 0.4000 mg | SUBLINGUAL_TABLET | SUBLINGUAL | Status: DC | PRN

## 2014-07-09 MED ORDER — CARVEDILOL 6.25 MG PO TABS: 3.1250 mg | ORAL_TABLET | Freq: Two times a day (BID) | ORAL | Status: DC

## 2014-07-09 NOTE — Assessment & Plan Note (Signed)
Stable with no angina and good activity level.  Continue medical Rx Recent shoulder surgery with no cardiac complications Going to [facility]  With Silver Sneakers and no angina

## 2014-07-09 NOTE — Assessment & Plan Note (Signed)
Well controlled.  Continue current medications and low sodium Dash type diet.    

## 2014-07-09 NOTE — Assessment & Plan Note (Signed)
Cholesterol is at goal.  Continue current dose of statin and diet Rx.  No myalgias or side effects.  F/U  LFT's in 6 months. Lab Results  Component Value Date   LDLCALC 54 11/30/2009  Most recent labs with primary He indicates LDL under 100 still

## 2014-07-09 NOTE — Patient Instructions (Addendum)
Your physician wants you to follow-up in:  6 MONTHS WITH DR NISHAN  You will receive a reminder letter in the mail two months in advance. If you don't receive a letter, please call our office to schedule the follow-up appointment. Your physician recommends that you continue on your current medications as directed. Please refer to the Current Medication list given to you today. 

## 2014-08-31 ENCOUNTER — Encounter (HOSPITAL_COMMUNITY): Payer: Self-pay | Admitting: Physical Medicine and Rehabilitation

## 2014-08-31 ENCOUNTER — Emergency Department (HOSPITAL_COMMUNITY)
Admission: EM | Admit: 2014-08-31 | Discharge: 2014-08-31 | Disposition: A | Discharge status: Home / Self Care | Payer: Medicare Other | Attending: Emergency Medicine | Admitting: Emergency Medicine

## 2014-08-31 ENCOUNTER — Emergency Department (HOSPITAL_COMMUNITY): Payer: Medicare Other

## 2014-08-31 DIAGNOSIS — Y939 Activity, unspecified: Secondary | ICD-10-CM | POA: Insufficient documentation

## 2014-08-31 DIAGNOSIS — R42 Dizziness and giddiness: Secondary | ICD-10-CM | POA: Diagnosis not present

## 2014-08-31 DIAGNOSIS — S92351A Displaced fracture of fifth metatarsal bone, right foot, initial encounter for closed fracture: Secondary | ICD-10-CM

## 2014-08-31 DIAGNOSIS — M1990 Unspecified osteoarthritis, unspecified site: Secondary | ICD-10-CM | POA: Insufficient documentation

## 2014-08-31 DIAGNOSIS — S92354A Nondisplaced fracture of fifth metatarsal bone, right foot, initial encounter for closed fracture: Secondary | ICD-10-CM | POA: Diagnosis not present

## 2014-08-31 DIAGNOSIS — Z7982 Long term (current) use of aspirin: Secondary | ICD-10-CM | POA: Insufficient documentation

## 2014-08-31 DIAGNOSIS — Z8719 Personal history of other diseases of the digestive system: Secondary | ICD-10-CM | POA: Diagnosis not present

## 2014-08-31 DIAGNOSIS — Z9889 Other specified postprocedural states: Secondary | ICD-10-CM | POA: Insufficient documentation

## 2014-08-31 DIAGNOSIS — Z872 Personal history of diseases of the skin and subcutaneous tissue: Secondary | ICD-10-CM | POA: Diagnosis not present

## 2014-08-31 DIAGNOSIS — I10 Essential (primary) hypertension: Secondary | ICD-10-CM | POA: Insufficient documentation

## 2014-08-31 DIAGNOSIS — X58XXXA Exposure to other specified factors, initial encounter: Secondary | ICD-10-CM | POA: Insufficient documentation

## 2014-08-31 DIAGNOSIS — Z79899 Other long term (current) drug therapy: Secondary | ICD-10-CM | POA: Insufficient documentation

## 2014-08-31 DIAGNOSIS — R55 Syncope and collapse: Secondary | ICD-10-CM | POA: Insufficient documentation

## 2014-08-31 DIAGNOSIS — Z9861 Coronary angioplasty status: Secondary | ICD-10-CM | POA: Diagnosis not present

## 2014-08-31 DIAGNOSIS — I2510 Atherosclerotic heart disease of native coronary artery without angina pectoris: Secondary | ICD-10-CM | POA: Insufficient documentation

## 2014-08-31 DIAGNOSIS — Y998 Other external cause status: Secondary | ICD-10-CM | POA: Diagnosis not present

## 2014-08-31 DIAGNOSIS — Z8614 Personal history of Methicillin resistant Staphylococcus aureus infection: Secondary | ICD-10-CM | POA: Insufficient documentation

## 2014-08-31 DIAGNOSIS — Z85828 Personal history of other malignant neoplasm of skin: Secondary | ICD-10-CM | POA: Diagnosis not present

## 2014-08-31 DIAGNOSIS — Y929 Unspecified place or not applicable: Secondary | ICD-10-CM | POA: Diagnosis not present

## 2014-08-31 DIAGNOSIS — Z87891 Personal history of nicotine dependence: Secondary | ICD-10-CM | POA: Diagnosis not present

## 2014-08-31 LAB — COMPREHENSIVE METABOLIC PANEL
ALT: 22 U/L (ref 0–53)
ANION GAP: 10 (ref 5–15)
AST: 23 U/L (ref 0–37)
Albumin: 3.8 g/dL (ref 3.5–5.2)
Alkaline Phosphatase: 57 U/L (ref 39–117)
BILIRUBIN TOTAL: 1.2 mg/dL (ref 0.3–1.2)
BUN: 20 mg/dL (ref 6–23)
CALCIUM: 9.4 mg/dL (ref 8.4–10.5)
CO2: 23 mmol/L (ref 19–32)
Chloride: 101 mmol/L (ref 96–112)
Creatinine, Ser: 1.04 mg/dL (ref 0.50–1.35)
GFR calc Af Amer: 78 mL/min — ABNORMAL LOW (ref >90)
GFR calc non Af Amer: 67 mL/min — ABNORMAL LOW (ref >90)
GLUCOSE: 107 mg/dL — AB (ref 70–99)
Potassium: 3.9 mmol/L (ref 3.5–5.1)
SODIUM: 134 mmol/L — AB (ref 135–145)
Total Protein: 6.9 g/dL (ref 6.0–8.3)

## 2014-08-31 LAB — CBC
HEMATOCRIT: 46.3 % (ref 39.0–52.0)
Hemoglobin: 16 g/dL (ref 13.0–17.0)
MCH: 33.2 pg (ref 26.0–34.0)
MCHC: 34.6 g/dL (ref 30.0–36.0)
MCV: 96.1 fL (ref 78.0–100.0)
Platelets: 219 10*3/uL (ref 150–400)
RBC: 4.82 MIL/uL (ref 4.22–5.81)
RDW: 12.8 % (ref 11.5–15.5)
WBC: 8.1 10*3/uL (ref 4.0–10.5)

## 2014-08-31 LAB — BRAIN NATRIURETIC PEPTIDE: B NATRIURETIC PEPTIDE 5: 34.8 pg/mL (ref 0.0–100.0)

## 2014-08-31 LAB — I-STAT TROPONIN, ED
TROPONIN I, POC: 0 ng/mL (ref 0.00–0.08)
Troponin i, poc: 0 ng/mL (ref 0.00–0.08)

## 2014-08-31 MED ORDER — IBUPROFEN 800 MG PO TABS
800.0000 mg | ORAL_TABLET | Freq: Once | ORAL | Status: AC
  Administered 2014-08-31: 800 mg via ORAL
  Filled 2014-08-31: qty 1

## 2014-08-31 MED ORDER — ONDANSETRON HCL 4 MG/2ML IJ SOLN
4.0000 mg | Freq: Once | INTRAMUSCULAR | Status: AC
  Administered 2014-08-31: 4 mg via INTRAVENOUS
  Filled 2014-08-31: qty 2

## 2014-08-31 MED ORDER — SODIUM CHLORIDE 0.9 % IV BOLUS (SEPSIS)
1000.0000 mL | Freq: Once | INTRAVENOUS | Status: AC
  Administered 2014-08-31: 1000 mL via INTRAVENOUS

## 2014-08-31 NOTE — ED Notes (Signed)
Pt presents to department for evaluation of near syncope. States he became nauseated and dizzy this morning while in church, reports "I felt like I was going to pass out, but I didn't." also states he struck R foot on exercise equipment on Friday, now c/o increased pain. History of HTN and stent placement. Denies chest pain, respirations unlabored. Pt is alert and oriented x4, no neurological deficits noted.

## 2014-08-31 NOTE — ED Provider Notes (Signed)
CSN: [id number]     Arrival date & time 08/31/14  0806 History   First MD Initiated Contact with Patient 08/31/14 [phone]     Chief Complaint  Patient presents with  . Near Syncope  . Foot Pain     (Consider location/radiation/quality/duration/timing/severity/associated sxs/prior Treatment) HPI Comments: Patient was sitting in church and began getting nauseated and light-headedness. Does not describe dizziness as spinning. He stood up in church and walked out and came straight to the ED. He has never had syncope before. Does not say it worsens with standing or position changes.  Patient is a 79 y.o. male presenting with near-syncope, lower extremity pain, and dizziness. The history is provided by the patient.  Near Syncope Pertinent negatives include no chest pain.  Foot Pain Pertinent negatives include no chest pain.  Dizziness Quality:  Lightheadedness Severity:  Mild Onset quality:  Sudden Timing:  Constant Progression:  Improving Chronicity:  New Context: not with loss of consciousness, not with medication and not when urinating   Context comment:  Was sitting in church and began to feel nauseated and mildly dizzy Relieved by: going outside and standing in the fresh air. Worsened by:  Nothing Associated symptoms: no chest pain, no diarrhea, no nausea and no vomiting     Past Medical History  Diagnosis Date  . Abscess of groin, left   . Hx MRSA infection     after heart surgery several yrs ago  . Coronary artery disease   . Hypertension   . Abnormal stress test   . Chest pressure   . H/O hiatal hernia   . Gastroesophageal reflux disease with hiatal hernia     "shocsky ring", no longer using Rx  . Cancer     basal cell, history of   . Arthritis     R hip & knees   Past Surgical History  Procedure Laterality Date  . Cardiac catheterization  12/06/07    60%  . Appendectomy    . Hernia repair    . Knee arthroscopy Right 10/03/2012    Procedure: RIGHT KNEEE  ARTHROSCOPY WITH DEBRIDEMENT ;  Surgeon: Gearlean Alf, MD;  Location: [facility];  Service: Orthopedics;  Laterality: Right;  . Yag laser application      for glaucoma - both eyes, no drops as of yet   . Total hip arthroplasty Right 04/05/2013    Procedure: RIGHT TOTAL HIP ARTHROPLASTY ANTERIOR APPROACH;  Surgeon: Gearlean Alf, MD;  Location: [place];  Service: Orthopedics;  Laterality: Right;  . Colonoscopy    . Coronary angioplasty with stent placement     Family History  Problem Relation Age of Onset  . Stomach cancer      family hx  . Hypertension      family hx  . Heart disease      family hx  . Hypertension Sister   . Hypertension Brother    History  Substance Use Topics  . Smoking status: Former Smoker    Quit date: 09/28/1961  . Smokeless tobacco: Never Used  . Alcohol Use: 0.0 oz/week     Comment: occ    Review of Systems  Constitutional: Negative for fever, chills and diaphoresis.  Cardiovascular: Positive for near-syncope. Negative for chest pain.  Gastrointestinal: Negative for nausea, vomiting and diarrhea.  Neurological: Positive for dizziness.  All other systems reviewed and are negative.     Allergies  Review of patient's allergies indicates no known allergies.  Home Medications   Prior  to Admission medications   Medication Sig Start Date End Date Taking? Authorizing Provider  aspirin 325 MG tablet Take 325 mg by mouth every morning.     Historical Provider, MD  benazepril-hydrochlorthiazide (LOTENSIN HCT) 20-25 MG per tablet Take 1 tablet by mouth every morning. 07/09/14   [name], MD  carvedilol (COREG) 6.25 MG tablet Take 0.5 tablets (3.125 mg total) by mouth 2 (two) times daily with a meal. 07/09/14   [name], MD  Multiple Vitamin (MULTIVITAMIN) tablet Take 1 tablet by mouth daily.    Historical Provider, MD  nitroGLYCERIN (NITROSTAT) 0.4 MG SL tablet Place 1 tablet (0.4 mg total) under the tongue every 5 (five) minutes as needed for  chest pain. 07/09/14   [name], MD  rosuvastatin (CRESTOR) 20 MG tablet Take 1 tablet (20 mg total) by mouth every other day. 07/09/14   [name], MD   BP 135/70 mmHg  Pulse 49  Temp(Src) 97.7 F (36.5 C) (Oral)  Resp 21  SpO2 97% Physical Exam  Constitutional: He is oriented to person, place, and time. He appears well-developed and well-nourished. No distress.  HENT:  Head: Normocephalic and atraumatic.  Mouth/Throat: No oropharyngeal exudate.  Eyes: EOM are normal. Pupils are equal, round, and reactive to light.  Neck: Normal range of motion. Neck supple.  Cardiovascular: Normal rate and regular rhythm.  Exam reveals no friction rub.   No murmur heard. Pulmonary/Chest: Effort normal and breath sounds normal. No respiratory distress. He has no wheezes. He has no rales.  Abdominal: He exhibits no distension. There is no tenderness. There is no rebound.  Musculoskeletal: Normal range of motion. He exhibits no edema.  Neurological: He is alert and oriented to person, place, and time. No cranial nerve deficit or sensory deficit. He exhibits normal muscle tone. Coordination and gait normal. GCS eye subscore is 4. GCS verbal subscore is 5. GCS motor subscore is 6.  Skin: He is not diaphoretic.  Nursing note and vitals reviewed.   ED Course  Procedures (including critical care time) Labs Review Labs Reviewed  [place], ED    Imaging Review Dg Chest 2 View  08/31/2014   CLINICAL DATA:  79 year old male with a history of syncope.  EXAM: CHEST - 2 VIEW  COMPARISON:  02/27/2014  FINDINGS: Cardiomediastinal silhouette projects within normal limits in size and contour.  Calcifications of the aorta. No confluent airspace disease, pneumothorax, or pleural effusion.  No displaced fracture.  Unremarkable appearance of the upper abdomen.  IMPRESSION: No radiographic evidence of acute cardiopulmonary disease.   Atherosclerosis.  Signed,  Dulcy Fanny. Earleen Newport, DO  [facility]  [facility]   Electronically Signed   By: Corrie Mckusick D.O.   On: 08/31/2014 09:07   Dg Foot Complete Right  08/31/2014   CLINICAL DATA:  79 year old male with a history of right foot pain. Injury at the GM.  EXAM: RIGHT FOOT COMPLETE - 3+ VIEW  COMPARISON:  None.  FINDINGS: Acute nondisplaced fracture at the base of the fifth metatarsal with overlying soft tissue swelling.  No other acute bony abnormality identified. Hallux valgus deformity.  Dense calcifications of the posterior tibial artery, dorsalis pedis/anterior tibial artery, and the plantar arteries.  IMPRESSION: Acute fracture the base of the fifth metatarsal, nondisplaced, compatible with a Jones fracture.  Extensive vascular calcifications of the posterior tibial artery and anterior tibial artery, compatible with infrapopliteal disease.  If the patient has not yet been evaluated for claudication/CLI, noninvasive testing with ABI, segmental duplex, and segmental pulse volume recording may be considered as well as office based evaluation.  Signed,  Dulcy Fanny. Earleen Newport, DO  [facility]  [facility]   Electronically Signed   By: Corrie Mckusick D.O.   On: 08/31/2014 09:10     EKG Interpretation   Date/Time:  Sunday August 31 2014 08:15:22 EDT Ventricular Rate:  49 PR Interval:  288 QRS Duration: 88 QT Interval:  400 QTC Calculation: 361 R Axis:   15 Text Interpretation:  Sinus bradycardia Prolonged PR interval Bradycardia  new Confirmed by Mingo Amber  MD, [place] ([id number]) on 08/31/2014 8:24:25 AM      MDM   Final diagnoses:  Near syncope  Dizziness  Jones fracture, right, closed, initial encounter    79 year old male here with dizziness. Described as lightheadedness. Began with some associated nausea was sitting in church. Strep a walk outside and got some pressure which helped a little bit.  He thinks it could be because he took his meds an empty stomach this morning. He walked back in church after improved a little bit but it came back so decided to come to the ED. Denies any associated chest pain or pressure, shortness of breath, diaphoresis. No actual vomiting. Here vitals are stable other than some mild bradycardia in the high 40s. He is on a beta blocker. He has a normal neurologic exam here is normal cranial nerves, normal strength and sensation, normal coronation, normal gait. Belly is benign. Lungs are clear. EKG has a first-degree AV block and bradycardia which is similar to prior. We'll check labs, give some fluids and give him something to eat. This could be because of his medications on into stomach, he states he is improving since church. Of note patient also hit his foot on a weight machine 2 days ago. He has a Jones fracture of the right fifth metatarsal. Placed in post-op shoe.  Patient's labs normal. Patient given a liter of saline with mild decrease in blood pressure down to the high 90s. No MAPs less than 65. A shoulder that time says he's feeling better. Another liter of normal saline was given with complete resolution of symptoms. Serial troponins are negative. Instructed follow-up with PCP. I sent his cardiologist's note concerning his visit to the ED. Stable for discharge.  Evelina Bucy, MD 09/01/14 [phone]

## 2014-08-31 NOTE — Discharge Instructions (Signed)
Dizziness °Dizziness is a common problem. It is a feeling of unsteadiness or light-headedness. You may feel like you are about to faint. Dizziness can lead to injury if you stumble or fall. A person of any age group can suffer from dizziness, but dizziness is more common in older adults. °CAUSES  °Dizziness can be caused by many different things, including: °· Middle ear problems. °· Standing for too long. °· Infections. °· An allergic reaction. °· Aging. °· An emotional response to something, such as the sight of blood. °· Side effects of medicines. °· Tiredness. °· Problems with circulation or blood pressure. °· Excessive use of alcohol or medicines, or illegal drug use. °· Breathing too fast (hyperventilation). °· An irregular heart rhythm (arrhythmia). °· A low red blood cell count (anemia). °· Pregnancy. °· Vomiting, diarrhea, fever, or other illnesses that cause body fluid loss (dehydration). °· Diseases or conditions such as Parkinson's disease, high blood pressure (hypertension), diabetes, and thyroid problems. °· Exposure to extreme heat. °DIAGNOSIS  °Your health care provider will ask about your symptoms, perform a physical exam, and perform an electrocardiogram (ECG) to record the electrical activity of your heart. Your health care provider may also perform other heart or blood tests to determine the cause of your dizziness. These may include: °· Transthoracic echocardiogram (TTE). During echocardiography, sound waves are used to evaluate how blood flows through your heart. °· Transesophageal echocardiogram (TEE). °· Cardiac monitoring. This allows your health care provider to monitor your heart rate and rhythm in real time. °· Holter monitor. This is a portable device that records your heartbeat and can help diagnose heart arrhythmias. It allows your health care provider to track your heart activity for several days if needed. °· Stress tests by exercise or by giving medicine that makes the heart beat  faster. °TREATMENT  °Treatment of dizziness depends on the cause of your symptoms and can vary greatly. °HOME CARE INSTRUCTIONS  °· Drink enough fluids to keep your urine clear or pale yellow. This is especially important in very hot weather. In older adults, it is also important in cold weather. °· Take your medicine exactly as directed if your dizziness is caused by medicines. When taking blood pressure medicines, it is especially important to get up slowly. °· Rise slowly from chairs and steady yourself until you feel okay. °· In the morning, first sit up on the side of the bed. When you feel okay, stand slowly while holding onto something until you know your balance is fine. °· Move your legs often if you need to stand in one place for a long time. Tighten and relax your muscles in your legs while standing. °· Have someone stay with you for 1-2 days if dizziness continues to be a problem. Do this until you feel you are well enough to stay alone. Have the person call your health care provider if he or she notices changes in you that are concerning. °· Do not drive or use heavy machinery if you feel dizzy. °· Do not drink alcohol. °SEEK IMMEDIATE MEDICAL CARE IF:  °· Your dizziness or light-headedness gets worse. °· You feel nauseous or vomit. °· You have problems talking, walking, or using your arms, hands, or legs. °· You feel weak. °· You are not thinking clearly or you have trouble forming sentences. It may take a friend or family member to notice this. °· You have chest pain, abdominal pain, shortness of breath, or sweating. °· Your vision changes. °· You notice   any bleeding.  You have side effects from medicine that seems to be getting worse rather than better. MAKE SURE YOU:   Understand these instructions.  Will watch your condition.  Will get help right away if you are not doing well or get worse. Document Released: 10/26/2000 Document Revised: 05/07/2013 Document Reviewed: 11/19/2010 [facility]  Patient Information 2015 [place], [place]. This information is not intended to replace advice given to you by your health care provider. Make sure you discuss any questions you have with your health care provider.  Metatarsal Fracture, Undisplaced A metatarsal fracture is a break in the bone(s) of the foot. These are the bones of the foot that connect your toes to the bones of the ankle. DIAGNOSIS  The diagnoses of these fractures are usually made with X-rays. If there are problems in the forefoot and x-rays are normal a later bone scan will usually make the diagnosis.  TREATMENT AND HOME CARE INSTRUCTIONS  Treatment may or may not include a cast or walking shoe. When casts are needed the use is usually for short periods of time so as not to slow down healing with muscle wasting (atrophy).  Activities should be stopped until further advised by your caregiver.  Wear shoes with adequate shock absorbing capabilities and stiff soles.  Alternative exercise may be undertaken while waiting for healing. These may include bicycling and swimming, or as your caregiver suggests.  It is important to keep all follow-up visits or specialty referrals. The failure to keep these appointments could result in improper bone healing and chronic pain or disability.  Warning: Do not drive a car or operate a motor vehicle until your caregiver specifically tells you it is safe to do so. IF YOU DO NOT HAVE A CAST OR SPLINT:  You may walk on your injured foot as tolerated or advised.  Do not put any weight on your injured foot for as long as directed by your caregiver. Slowly increase the amount of time you walk on the foot as the pain allows or as advised.  Use crutches until you can bear weight without pain. A gradual increase in weight bearing may help.  Apply ice to the injury for 15-20 minutes each hour while awake for the first 2 days. Put the ice in a plastic bag and place a towel between the bag of ice and your  skin.  Only take over-the-counter or prescription medicines for pain, discomfort, or fever as directed by your caregiver. SEEK IMMEDIATE MEDICAL CARE IF:   Your cast gets damaged or breaks.  You have continued severe pain or more swelling than you did before the cast was put on, or the pain is not controlled with medications.  Your skin or nails below the injury turn blue or grey, or feel cold or numb.  There is a bad smell, or new stains or pus-like (purulent) drainage coming from the cast. MAKE SURE YOU:   Understand these instructions.  Will watch your condition.  Will get help right away if you are not doing well or get worse. Document Released: 01/22/2002 Document Revised: 07/25/2011 Document Reviewed: 12/14/2007 [facility] Patient Information 2015 [place], [place]. This information is not intended to replace advice given to you by your health care provider. Make sure you discuss any questions you have with your health care provider.

## 2014-08-31 NOTE — Progress Notes (Signed)
Orthopedic Tech Progress Note Patient Details:  [name] [name] 29-Sep-1935 [id number]  Ortho Devices Type of Ortho Device: Postop shoe/boot Ortho Device/Splint Location: rle Ortho Device/Splint Interventions: Application Viewed order from  [facility] order list   Hildred Priest 08/31/2014, 1:24 PM

## 2014-09-02 ENCOUNTER — Encounter: Payer: Self-pay | Admitting: Nurse Practitioner

## 2014-09-02 ENCOUNTER — Ambulatory Visit (INDEPENDENT_AMBULATORY_CARE_PROVIDER_SITE_OTHER): Payer: Medicare Other | Admitting: Nurse Practitioner

## 2014-09-02 VITALS — BP 148/86 | HR 55 | Ht 70.0 in | Wt 161.0 lb

## 2014-09-02 DIAGNOSIS — R55 Syncope and collapse: Secondary | ICD-10-CM | POA: Diagnosis not present

## 2014-09-02 DIAGNOSIS — I10 Essential (primary) hypertension: Secondary | ICD-10-CM

## 2014-09-02 DIAGNOSIS — E785 Hyperlipidemia, unspecified: Secondary | ICD-10-CM

## 2014-09-02 DIAGNOSIS — I2510 Atherosclerotic heart disease of native coronary artery without angina pectoris: Secondary | ICD-10-CM | POA: Diagnosis not present

## 2014-09-02 NOTE — Progress Notes (Signed)
Patient Name: [name] [name] Date of Encounter: 09/02/2014  Primary Care Provider:  Laurey Morale, MD Primary Cardiologist:  P. Johnsie Cancel, MD   Chief Complaint  79 year old male with a history of CAD status post RCA stenting in 2009 who presents today secondary to recent ER visit for presyncope, bradycardia, and hypotension.  Past Medical History   Past Medical History  Diagnosis Date  . Abscess of groin, left   . Hx MRSA infection   . Coronary artery disease     a. 11/2007 Cath/PCI: LM nl, LAD 40-41m, d1 40-50ost, LCX nl, RCA 90d (Platinum Study DES - promus), EF 60%.  . Hypertension   . H/O hiatal hernia   . Gastroesophageal reflux disease with hiatal hernia   . Cancer     basal cell, history of   . Arthritis     R hip & knees  . Pre-syncope     a. 08/2014 w/ sinus bradycardia in 40's in ED.  Marland Kitchen Closed fracture of right foot     a. 08/2014 Fx of R fifth metatarsal (Jones Fx).   Past Surgical History  Procedure Laterality Date  . Cardiac catheterization  12/06/07    60%  . Appendectomy    . Hernia repair    . Knee arthroscopy Right 10/03/2012    Procedure: RIGHT KNEEE ARTHROSCOPY WITH DEBRIDEMENT ;  Surgeon: Gearlean Alf, MD;  Location: [facility];  Service: Orthopedics;  Laterality: Right;  . Yag laser application      for glaucoma - both eyes, no drops as of yet   . Total hip arthroplasty Right 04/05/2013    Procedure: RIGHT TOTAL HIP ARTHROPLASTY ANTERIOR APPROACH;  Surgeon: Gearlean Alf, MD;  Location: [facility];  Service: Orthopedics;  Laterality: Right;  . Colonoscopy    . Coronary angioplasty with stent placement      Allergies  No Known Allergies  HPI  79 year old male with prior history of CAD status post RCA stenting in 2009. He is very active and exercises on a regular basis. Last Friday, he was using the rowing machine at the gym and upon getting off of it, he struck his right lateral foot against the machine causing pain and later swelling. Pain persisted  throughout the weekend for which he would take over-the-counter Tylenol or ibuprofen. On Sunday morning, he says he was rushing to get to church and took all of his medications, including carvedilol and enalapril HCTZ, on an empty stomach. He would not normally do that. Once at church, he was sitting and had sudden onset of presyncope/dizziness with associated nausea and weakness. He did not have any chest pain, dyspnea, or diaphoresis. He and his wife left church early and initially drove to an urgent care but when that was not opened yet, they drove to the ED. In the ED, he was found to be bradycardic with rates in the high 40s. Blood pressure was variable but at one point was lower than 956 systolic. Lab work was relatively unrevealing with normal creatinine, normal H&H, and normal glucose. Troponins were normal. He was given a total of 3 L of fluid with improvement in blood pressure and resolution of symptoms. He says that in total, symptoms lasted less than about 30 minutes and began to improve once he was able to lie down and an IV was placed. While in the ED, his foot was also x-rayed and revealed a fracture of the right fifth metatarsal. He is wearing a splint. He has follow-up  with orthopedics tomorrow. He has had no recurrence of presyncope. He denies chest pain, PND, orthopnea, syncope, edema, or early satiety.  Home Medications  Prior to Admission medications   Medication Sig Start Date End Date Taking? Authorizing Provider  aspirin 325 MG tablet Take 325 mg by mouth every morning.    Yes Historical Provider, MD  benazepril-hydrochlorthiazide (LOTENSIN HCT) 20-25 MG per tablet Take 1 tablet by mouth every morning. 07/09/14  Yes [name], MD  carvedilol (COREG) 6.25 MG tablet Take 0.5 tablets (3.125 mg total) by mouth 2 (two) times daily with a meal. 07/09/14  Yes [name], MD  cholecalciferol (VITAMIN D) 1000 UNITS tablet Take 2,000 Units by mouth daily.   Yes Historical Provider, MD    Multiple Vitamin (MULTIVITAMIN) tablet Take 1 tablet by mouth daily.   Yes Historical Provider, MD  nitroGLYCERIN (NITROSTAT) 0.4 MG SL tablet Place 1 tablet (0.4 mg total) under the tongue every 5 (five) minutes as needed for chest pain. 07/09/14  Yes [name], MD  rosuvastatin (CRESTOR) 20 MG tablet Take 1 tablet (20 mg total) by mouth every other day. 07/09/14  Yes [name], MD    Review of Systems  As above, he had been having right foot pain. He also developed presyncope 3 days ago prompting ER visit.  All other systems reviewed and are otherwise negative except as noted above.  Physical Exam  VS:  BP 148/86 mmHg  Pulse 55  Ht 5\' 10"  (1.778 m)  Wt 161 lb (73.029 kg)  BMI 23.10 kg/m2 , BMI Body mass index is 23.1 kg/(m^2). GEN: Well nourished, well developed, in no acute distress. HEENT: normal. Neck: Supple, no JVD, carotid bruits, or masses. Cardiac: RRR, no murmurs, rubs, or gallops. No clubbing, cyanosis, edema.  Radials/DP/PT 2+ and equal bilaterally.  Respiratory:  Respirations regular and unlabored, clear to auscultation bilaterally. GI: Soft, nontender, nondistended, BS + x 4. MS: no deformity or atrophy. Skin: warm and dry, no rash. Neuro:  Strength and sensation are intact. Psych: Normal affect.  Accessory Clinical Findings  ECG - sinus bradycardia, 55, first-degree AV block, no acute ST or T changes.  Assessment & Plan  1.  Presyncope: Patient developed presyncope in the setting of nausea, bradycardia, and hypotension 3 days ago. He was treated with IV fluids in the emergency room with fairly immediate recovery. Lab work was relatively unrevealing. He was told that he may have been dehydrated. Symptoms appear to be most likely vasovagal in nature. As it has been several years since his last echocardiogram, I will repeat echo to evaluate LV function. As he had documented bradycardia in the emergency room, I do not see a role for monitoring at this time. In  the absence of chest pain or dyspnea with otherwise very good exercise tolerance, I do not see a role for stress testing at this time. He has been on low-dose carvedilol along with Lotensin HCT for several years and has always tolerated this. He runs relatively bradycardic in the 50s to 60s at baseline and he says his blood pressure at home is usually controlled. His blood pressure slightly elevated today. I will not make any changes to his blood pressure regimen.  2. Coronary artery disease: Status post prior RCA stenting. He has very good exercise tolerance. Continue aspirin, beta blocker, ACE inhibitor, and statin therapy.  3. Hypertension: As above, he reports relatively good control at home. Her sugars slightly elevated today. I will make no  changes to his regimen today and have recommended that he continue to follow his blood pressure at home.  4. Hyperlipidemia: Continue statin therapy.  5. Right fifth metatarsal fracture: He has follow-up with orthopedics tomorrow. He is using over-the-counter pain management.  6. Disposition: Follow-up with Dr. Johnsie Cancel in 6 mos or sooner if necessary.    Murray Hodgkins, NP 09/02/2014, 10:40 AM

## 2014-09-02 NOTE — Patient Instructions (Signed)
Medication Instructions:  Your physician recommends that you continue on your current medications as directed. Please refer to the Current Medication list given to you today.   Labwork: NONE  Testing/Procedures: Your physician has requested that you have an echocardiogram. Echocardiography is a painless test that uses sound waves to create images of your heart. It provides your doctor with information about the size and shape of your heart and how well your heart's chambers and valves are working. This procedure takes approximately one hour. There are no restrictions for this procedure.   Follow-Up: Your physician wants you to follow-up in: 6 months with Dr. Johnsie Cancel.  You will receive a reminder letter in the mail two months in advance. If you don't receive a letter, please call our office to schedule the follow-up appointment.   Any Other Special Instructions Will Be Listed Below (If Applicable).

## 2014-09-03 ENCOUNTER — Emergency Department (HOSPITAL_COMMUNITY): Payer: Medicare Other

## 2014-09-03 ENCOUNTER — Telehealth: Payer: Self-pay | Admitting: Cardiovascular Disease

## 2014-09-03 ENCOUNTER — Emergency Department (HOSPITAL_COMMUNITY)
Admission: EM | Admit: 2014-09-03 | Discharge: 2014-09-03 | Disposition: A | Discharge status: Home / Self Care | Payer: Medicare Other | Attending: Emergency Medicine | Admitting: Emergency Medicine

## 2014-09-03 ENCOUNTER — Encounter (HOSPITAL_COMMUNITY): Payer: Self-pay | Admitting: Emergency Medicine

## 2014-09-03 DIAGNOSIS — R55 Syncope and collapse: Secondary | ICD-10-CM | POA: Insufficient documentation

## 2014-09-03 DIAGNOSIS — Z87891 Personal history of nicotine dependence: Secondary | ICD-10-CM | POA: Insufficient documentation

## 2014-09-03 DIAGNOSIS — I2510 Atherosclerotic heart disease of native coronary artery without angina pectoris: Secondary | ICD-10-CM | POA: Insufficient documentation

## 2014-09-03 DIAGNOSIS — M1990 Unspecified osteoarthritis, unspecified site: Secondary | ICD-10-CM | POA: Insufficient documentation

## 2014-09-03 DIAGNOSIS — R42 Dizziness and giddiness: Secondary | ICD-10-CM | POA: Diagnosis not present

## 2014-09-03 DIAGNOSIS — Z85828 Personal history of other malignant neoplasm of skin: Secondary | ICD-10-CM | POA: Insufficient documentation

## 2014-09-03 DIAGNOSIS — Z9861 Coronary angioplasty status: Secondary | ICD-10-CM | POA: Diagnosis not present

## 2014-09-03 DIAGNOSIS — Z8719 Personal history of other diseases of the digestive system: Secondary | ICD-10-CM | POA: Diagnosis not present

## 2014-09-03 DIAGNOSIS — M79671 Pain in right foot: Secondary | ICD-10-CM | POA: Diagnosis not present

## 2014-09-03 DIAGNOSIS — S92354A Nondisplaced fracture of fifth metatarsal bone, right foot, initial encounter for closed fracture: Secondary | ICD-10-CM | POA: Diagnosis not present

## 2014-09-03 DIAGNOSIS — R404 Transient alteration of awareness: Secondary | ICD-10-CM | POA: Diagnosis not present

## 2014-09-03 DIAGNOSIS — Z8619 Personal history of other infectious and parasitic diseases: Secondary | ICD-10-CM | POA: Diagnosis not present

## 2014-09-03 DIAGNOSIS — Z9889 Other specified postprocedural states: Secondary | ICD-10-CM | POA: Insufficient documentation

## 2014-09-03 DIAGNOSIS — Z8781 Personal history of (healed) traumatic fracture: Secondary | ICD-10-CM | POA: Diagnosis not present

## 2014-09-03 DIAGNOSIS — I10 Essential (primary) hypertension: Secondary | ICD-10-CM | POA: Diagnosis not present

## 2014-09-03 DIAGNOSIS — Z8614 Personal history of Methicillin resistant Staphylococcus aureus infection: Secondary | ICD-10-CM | POA: Diagnosis not present

## 2014-09-03 DIAGNOSIS — R531 Weakness: Secondary | ICD-10-CM | POA: Diagnosis not present

## 2014-09-03 DIAGNOSIS — R51 Headache: Secondary | ICD-10-CM | POA: Diagnosis not present

## 2014-09-03 DIAGNOSIS — Z7982 Long term (current) use of aspirin: Secondary | ICD-10-CM | POA: Diagnosis not present

## 2014-09-03 DIAGNOSIS — Z79899 Other long term (current) drug therapy: Secondary | ICD-10-CM | POA: Diagnosis not present

## 2014-09-03 LAB — CBG MONITORING, ED: GLUCOSE-CAPILLARY: 118 mg/dL — AB (ref 70–99)

## 2014-09-03 LAB — I-STAT CHEM 8, ED
BUN: 15 mg/dL (ref 6–23)
CREATININE: 0.9 mg/dL (ref 0.50–1.35)
Calcium, Ion: 1.2 mmol/L (ref 1.13–1.30)
Chloride: 101 mmol/L (ref 96–112)
Glucose, Bld: 112 mg/dL — ABNORMAL HIGH (ref 70–99)
HEMATOCRIT: 48 % (ref 39.0–52.0)
Hemoglobin: 16.3 g/dL (ref 13.0–17.0)
Potassium: 3.8 mmol/L (ref 3.5–5.1)
SODIUM: 139 mmol/L (ref 135–145)
TCO2: 23 mmol/L (ref 0–100)

## 2014-09-03 LAB — I-STAT TROPONIN, ED: Troponin i, poc: 0.01 ng/mL (ref 0.00–0.08)

## 2014-09-03 NOTE — Telephone Encounter (Signed)
Stop coreg and get event monitor

## 2014-09-03 NOTE — ED Provider Notes (Signed)
CSN: [id number]     Arrival date & time 09/03/14  0306 History  This chart was scribed for [name] Speak, MD by [name], ED Scribe. This patient was seen in room A03C/A03C and the patient's care was started at 3:29 AM.    Chief Complaint  Patient presents with  . Near Syncope   The history is provided by the patient and the spouse. No language interpreter was used.     HPI Comments: [name] [name] is a 79 y.o. male with past medical history of CAD, HTN who presents to the Emergency Department complaining of near syncope. He explains that he was up in the middle of the night tonight to use the restroom but felt suddenly dizzy, lightheaded for approximately 20 minutes; when his symptoms did not improve, patient called EMS. He states he still feels generally unwell, with slight dizziness, headache, and nausea. His wife reports his blood pressure was "high" at home. He denies numbness, paresthesias, weakness, vision disturbance.   Patient reports that he was seen here 08/31/14 for similar symptoms (same near-syncope, dizziness, nausea, but without headache) without conclusive diagnosis; his blood pressure was "low" at this visit. He was seen by a cardiologist yesterday and is scheduled for an echo. Patient reports recent shoulder surgery (October 2015) and foot injury (mid April 2016).   He takes Coreg and Lotensin HCT daily as prescribed; he denies recent medication changes.   Past Medical History  Diagnosis Date  . Abscess of groin, left   . Hx MRSA infection   . Coronary artery disease     a. 11/2007 Cath/PCI: LM nl, LAD 40-39m, d1 40-50ost, LCX nl, RCA 90d (Platinum Study DES - promus), EF 60%.  . Hypertension   . H/O hiatal hernia   . Gastroesophageal reflux disease with hiatal hernia   . Cancer     basal cell, history of   . Arthritis     R hip & knees  . Pre-syncope     a. 08/2014 w/ sinus bradycardia in 40's in ED.  Marland Kitchen Closed fracture of right foot     a. 08/2014 Fx of R fifth  metatarsal (Jones Fx).   Past Surgical History  Procedure Laterality Date  . Cardiac catheterization  12/06/07    60%  . Appendectomy    . Hernia repair    . Knee arthroscopy Right 10/03/2012    Procedure: RIGHT KNEEE ARTHROSCOPY WITH DEBRIDEMENT ;  Surgeon: Gearlean Alf, MD;  Location: [facility];  Service: Orthopedics;  Laterality: Right;  . Yag laser application      for glaucoma - both eyes, no drops as of yet   . Total hip arthroplasty Right 04/05/2013    Procedure: RIGHT TOTAL HIP ARTHROPLASTY ANTERIOR APPROACH;  Surgeon: Gearlean Alf, MD;  Location: [facility];  Service: Orthopedics;  Laterality: Right;  . Colonoscopy    . Coronary angioplasty with stent placement     Family History  Problem Relation Age of Onset  . Stomach cancer      family hx  . Hypertension      family hx  . Heart disease      family hx  . Hypertension Sister   . Hypertension Brother   . Cancer Father    History  Substance Use Topics  . Smoking status: Former Smoker    Quit date: 09/28/1961  . Smokeless tobacco: Never Used  . Alcohol Use: 0.0 oz/week     Comment: occ    Review  of Systems  A complete 10 system review of systems was obtained and all systems are negative except as noted in the HPI and PMH.    Allergies  Review of patient's allergies indicates no known allergies.  Home Medications   Prior to Admission medications   Medication Sig Start Date End Date Taking? Authorizing Provider  aspirin 325 MG tablet Take 325 mg by mouth every morning.     Historical Provider, MD  benazepril-hydrochlorthiazide (LOTENSIN HCT) 20-25 MG per tablet Take 1 tablet by mouth every morning. 07/09/14   [name], MD  carvedilol (COREG) 6.25 MG tablet Take 0.5 tablets (3.125 mg total) by mouth 2 (two) times daily with a meal. 07/09/14   [name], MD  cholecalciferol (VITAMIN D) 1000 UNITS tablet Take 2,000 Units by mouth daily.    Historical Provider, MD  Multiple Vitamin (MULTIVITAMIN) tablet  Take 1 tablet by mouth daily.    Historical Provider, MD  nitroGLYCERIN (NITROSTAT) 0.4 MG SL tablet Place 1 tablet (0.4 mg total) under the tongue every 5 (five) minutes as needed for chest pain. 07/09/14   [name], MD  rosuvastatin (CRESTOR) 20 MG tablet Take 1 tablet (20 mg total) by mouth every other day. 07/09/14   [name], MD   BP 162/78 mmHg  Pulse 63  Temp(Src) 97.4 F (36.3 C) (Oral)  Resp 18  Ht 5\' 10"  (1.778 m)  Wt 161 lb (73.029 kg)  BMI 23.10 kg/m2  SpO2 100% Physical Exam  Constitutional: He is oriented to person, place, and time. He appears well-developed and well-nourished. No distress.  HENT:  Head: Normocephalic and atraumatic.  Mouth/Throat: Oropharynx is clear and moist. No oropharyngeal exudate.  Moist mucous membranes  Eyes: EOM are normal. Pupils are equal, round, and reactive to light.  Neck: Normal range of motion. Neck supple. No JVD present.  Cardiovascular: Normal rate, regular rhythm and normal heart sounds.  Exam reveals no gallop and no friction rub.   No murmur heard. Pulmonary/Chest: Effort normal and breath sounds normal. No respiratory distress. He has no wheezes. He has no rales.  Abdominal: Soft. Bowel sounds are normal. He exhibits no mass. There is no tenderness. There is no rebound and no guarding.  Musculoskeletal: Normal range of motion. He exhibits no edema.  Moves all extremities normally.   Lymphadenopathy:    He has no cervical adenopathy.  Neurological: He is alert and oriented to person, place, and time. He displays normal reflexes. No cranial nerve deficit. He exhibits normal muscle tone. Coordination normal.  Skin: Skin is warm and dry. No rash noted.  Psychiatric: He has a normal mood and affect. His behavior is normal.  Nursing note and vitals reviewed.   ED Course  Procedures   DIAGNOSTIC STUDIES: Oxygen Saturation is 100% on RA, normal by my interpretation.    COORDINATION OF CARE: 3:39 AM Discussed treatment  plan with pt at bedside and pt agreed to plan.   Labs Review Labs Reviewed  CBG MONITORING, ED  I-STAT CHEM 8, ED    Imaging Review No results found.   EKG Interpretation   Date/Time:  Wednesday September 03 2014 03:19:34 EDT Ventricular Rate:  59 PR Interval:  279 QRS Duration: 85 QT Interval:  394 QTC Calculation: 390 R Axis:   25 Text Interpretation:  Sinus rhythm Prolonged PR interval Abnormal R-wave  progression, early transition Confirmed by DELOS  MD, Alyzah Pelly ([id number]) on  09/03/2014 3:41:38 AM      MDM  Final diagnoses:  None    Patient is a 79 year old male who presents for the second time in the past 3 days for evaluation of near-syncope. He states that this morning he became lightheaded and felt as though he might pass out. This episode lasted for approximately 30 minutes. He then woke his wife who in turn called EMS and brought him here to be evaluated. His symptoms were ongoing when he arrived here. His vital signs are stable. He is not bradycardic, hypotensive, and oxygen saturations are normal. His neurologic exam is nonfocal and CT of the head is negative. I am uncertain as to the exact etiology of his symptoms, however nothing appears emergent. He has an appointment with his cardiologist in a few days who is planning to perform an echocardiogram. I have advised him to keep this appointment and he understands to return in the meantime if his symptoms significantly worsen or change.   I personally performed the services described in this documentation, which was scribed in my presence. The recorded information has been reviewed and is accurate.       [name] Speak, MD 09/03/14 [phone]

## 2014-09-03 NOTE — Telephone Encounter (Signed)
Follow Up  Pt wife calling to speak w/ Rn about pt's condition- see previous note. Please call back and discuss.

## 2014-09-03 NOTE — Telephone Encounter (Signed)
PT'S WIFE  AWARE  .Adonis Housekeeper

## 2014-09-03 NOTE — ED Notes (Signed)
Per EMS, patient here for near syncope since Sunday. Recently seen and treated for dehydration when he experienced the same: lightheadedness, flush, weakness, and nausea. Occasionally, heart rate decreases to 50s with ems then increases to 70s. 1 degree avb noted with heart rate 54. bp 166/98, p 70s, o2 sat 95% on room air, cbg 96. 20g present in Left forearm. 4mg  of zofran given IV by ems.

## 2014-09-03 NOTE — ED Notes (Signed)
Dr. Delo at the bedside 

## 2014-09-03 NOTE — Discharge Instructions (Signed)

## 2014-09-03 NOTE — Telephone Encounter (Signed)
LM TO CALL BACK ./CY 

## 2014-09-03 NOTE — Telephone Encounter (Signed)
New Message      Pt's wife calling stating that pt is still having these episodes of dizziness like he is going to faint and very [place]. States that pt went back to the ER last night and all of his labs, ct, and other tests came back normal and they don't know what to do. Please call back and advise.

## 2014-09-03 NOTE — Telephone Encounter (Signed)
SPOKE WITH PT  AND   PT'S WIFE  HAD ANOTHER EPISODE  OF  DIZZINESS YESTERDAY  WENT  TO  ED PER WIFE  HR  WENT  AS LOW AS  48   ALL  TESTING  CAME  BACK  OKAY FROM ED   WILL FORWARD TO DR Johnsie Cancel  FOR REVIEW PT  SCHEDULED MON  09-08-14 AT  10:30  FOR  AN ECHO./CY PT  TAKING   CARVEDILOL  6.25 MG  1/2  TAB  BID  .Adonis Housekeeper

## 2014-09-03 NOTE — ED Notes (Signed)
cbg of 118.

## 2014-09-08 ENCOUNTER — Ambulatory Visit (HOSPITAL_COMMUNITY): Payer: Medicare Other | Attending: Cardiology | Admitting: Radiology

## 2014-09-08 DIAGNOSIS — R55 Syncope and collapse: Secondary | ICD-10-CM | POA: Diagnosis not present

## 2014-09-08 DIAGNOSIS — I2510 Atherosclerotic heart disease of native coronary artery without angina pectoris: Secondary | ICD-10-CM | POA: Diagnosis not present

## 2014-09-08 DIAGNOSIS — I10 Essential (primary) hypertension: Secondary | ICD-10-CM | POA: Insufficient documentation

## 2014-09-08 NOTE — Progress Notes (Signed)
Echocardiogram performed.  

## 2014-09-09 ENCOUNTER — Encounter: Payer: Self-pay | Admitting: *Deleted

## 2014-09-09 ENCOUNTER — Ambulatory Visit (INDEPENDENT_AMBULATORY_CARE_PROVIDER_SITE_OTHER): Payer: Medicare Other

## 2014-09-09 DIAGNOSIS — R42 Dizziness and giddiness: Secondary | ICD-10-CM

## 2014-09-09 NOTE — Progress Notes (Signed)
Patient ID: [name] [name], male   DOB: [birth year], 79 y.o.   MRN: [id number] [employer] verite 30 day cardiac event monitor applied to patient.

## 2014-09-12 DIAGNOSIS — S92354D Nondisplaced fracture of fifth metatarsal bone, right foot, subsequent encounter for fracture with routine healing: Secondary | ICD-10-CM | POA: Diagnosis not present

## 2014-09-17 ENCOUNTER — Telehealth: Payer: Self-pay | Admitting: Cardiovascular Disease

## 2014-10-09 NOTE — Telephone Encounter (Signed)
error 

## 2014-10-10 DIAGNOSIS — S92354D Nondisplaced fracture of fifth metatarsal bone, right foot, subsequent encounter for fracture with routine healing: Secondary | ICD-10-CM | POA: Diagnosis not present

## 2014-10-14 ENCOUNTER — Telehealth: Payer: Self-pay | Admitting: *Deleted

## 2014-10-14 NOTE — Telephone Encounter (Signed)
PT  AWARE OF MONITOR  RESULTS  PER  DR NISHAN  NSR PAC'S  NO SIG  ARRHYTHMIAS./CY

## 2014-10-15 ENCOUNTER — Encounter: Payer: Self-pay | Admitting: Physician Assistant

## 2014-10-15 ENCOUNTER — Ambulatory Visit (INDEPENDENT_AMBULATORY_CARE_PROVIDER_SITE_OTHER): Payer: Medicare Other | Admitting: Physician Assistant

## 2014-10-15 VITALS — BP 140/70 | HR 86 | Ht 70.0 in | Wt 157.0 lb

## 2014-10-15 DIAGNOSIS — I2510 Atherosclerotic heart disease of native coronary artery without angina pectoris: Secondary | ICD-10-CM | POA: Diagnosis not present

## 2014-10-15 DIAGNOSIS — R42 Dizziness and giddiness: Secondary | ICD-10-CM

## 2014-10-15 DIAGNOSIS — R001 Bradycardia, unspecified: Secondary | ICD-10-CM

## 2014-10-15 DIAGNOSIS — E785 Hyperlipidemia, unspecified: Secondary | ICD-10-CM | POA: Diagnosis not present

## 2014-10-15 DIAGNOSIS — I10 Essential (primary) hypertension: Secondary | ICD-10-CM

## 2014-10-15 NOTE — Patient Instructions (Signed)
Medication Instructions:  Your physician recommends that you continue on your current medications as directed. Please refer to the Current Medication list given to you today.   Labwork: NONE  Testing/Procedures: NONE  Follow-Up: 04/2015 WITH DR. Johnsie Cancel; WE WILL SEND OUT A REMINDER LETTER A COUPLE MONTHS AHEAD OF TIME  Any Other Special Instructions Will Be Listed Below (If Applicable).

## 2014-10-15 NOTE — Progress Notes (Signed)
Cardiology Office Note   Date:  10/15/2014   ID:  NIKOLIS BERENT, DOB 10-22-1935, MRN [id number]  PCP:  Laurey Morale, MD  Cardiologist:  Dr. Jenkins Rouge    Chief Complaint  Patient presents with  . Dizziness     History of Present Illness: [name] [name] is a 79 y.o. male with a hx of CAD status post DES to the RCA in 2009, HTN, HL. He saw Ignacia Bayley, NP 09/02/14 after a recent emergency room visit for near-syncope/dizziness. This occurred while at church after taking his medications on an empty stomach. He had associated nausea and weakness. He denied chest pain. In the emergency room, he was found to be bradycardic with rates in the upper 40s and somewhat soft blood pressure (431 systolic). Cardiac enzymes were unremarkable. He was given IV fluids. Echocardiogram was arranged. This demonstrated normal LV function without significant valvular abnormalities. Six-month follow-up was recommended. However, he went back to the emergency room with another episode of dizziness. Heart rate was in the 40s and his carvedilol was discontinued. Event monitor was arranged. Apparently, according to the notes in the chart, this demonstrated NSR, PACs and no significant arrhythmia.  He returns for FU.  He has been doing well since his Coreg was stopped.  The patient denies chest pain, shortness of breath, syncope, orthopnea, PND or significant pedal edema. No further near syncope.     Studies/Reports Reviewed Today:  Echo 09/08/14 - EF 55%to 60%. Wall motion was normal; Grade 1 diastolicdysfunction. - Aortic valve: Moderately calcified annulus. Trileaflet. Moderatethickening and calcification, consistent with sclerosis. - Mitral valve: There was mild regurgitation. - Pulmonic valve: There was trivial regurgitation.  LHC 11/2007 LM: Normal LAD: Proximal to mid 40-50%, ostial D1 40-50% LCx: Normal RCA: Distal 90% EF 60% PCI: DES to the RCA  Past Medical History  Diagnosis Date  .  Abscess of groin, left   . Hx MRSA infection   . Coronary artery disease     a. 11/2007 Cath/PCI: LM nl, LAD 40-52m, d1 40-50ost, LCX nl, RCA 90d (Platinum Study DES - promus), EF 60%.  . Hypertension   . H/O hiatal hernia   . Gastroesophageal reflux disease with hiatal hernia   . Cancer     basal cell, history of   . Arthritis     R hip & knees  . Pre-syncope     a. 08/2014 w/ sinus bradycardia in 40's in ED.  Marland Kitchen Closed fracture of right foot     a. 08/2014 Fx of R fifth metatarsal (Jones Fx).    Past Surgical History  Procedure Laterality Date  . Cardiac catheterization  12/06/07    60%  . Appendectomy    . Hernia repair    . Knee arthroscopy Right 10/03/2012    Procedure: RIGHT KNEEE ARTHROSCOPY WITH DEBRIDEMENT ;  Surgeon: Gearlean Alf, MD;  Location: [facility];  Service: Orthopedics;  Laterality: Right;  . Yag laser application      for glaucoma - both eyes, no drops as of yet   . Total hip arthroplasty Right 04/05/2013    Procedure: RIGHT TOTAL HIP ARTHROPLASTY ANTERIOR APPROACH;  Surgeon: Gearlean Alf, MD;  Location: [facility];  Service: Orthopedics;  Laterality: Right;  . Colonoscopy    . Coronary angioplasty with stent placement       Current Outpatient Prescriptions  Medication Sig Dispense Refill  . aspirin 325 MG tablet Take 325 mg by mouth every morning.     Marland Kitchen  benazepril-hydrochlorthiazide (LOTENSIN HCT) 20-25 MG per tablet Take 1 tablet by mouth every morning. 30 tablet 11  . cholecalciferol (VITAMIN D) 1000 UNITS tablet Take 2,000 Units by mouth daily.    . Multiple Vitamin (MULTIVITAMIN) tablet Take 1 tablet by mouth daily.    . nitroGLYCERIN (NITROSTAT) 0.4 MG SL tablet Place 1 tablet (0.4 mg total) under the tongue every 5 (five) minutes as needed for chest pain. 25 tablet 3  . rosuvastatin (CRESTOR) 20 MG tablet Take 1 tablet (20 mg total) by mouth every other day. 30 tablet 11   No current facility-administered medications for this visit.    Allergies:    Review of patient's allergies indicates no known allergies.    Social History:  The patient  reports that he quit smoking about 53 years ago. He has never used smokeless tobacco. He reports that he drinks alcohol. He reports that he does not use illicit drugs.   Family History:  The patient's family history includes Cancer in his father; Heart disease in an other family member; Hypertension in his brother, sister, and another family member; Stomach cancer in an other family member. There is no history of Heart attack or Stroke.    ROS:   Please see the history of present illness.   Review of Systems  Neurological: Negative for dizziness.  All other systems reviewed and are negative.    PHYSICAL EXAM: VS:  BP 140/70 mmHg  Pulse 86  Ht 5\' 10"  (1.778 m)  Wt 157 lb (71.215 kg)  BMI 22.53 kg/m2  SpO2 96%    Wt Readings from Last 3 Encounters:  10/15/14 157 lb (71.215 kg)  09/03/14 161 lb (73.029 kg)  09/02/14 161 lb (73.029 kg)     GEN: Well nourished, well developed, in no acute distress HEENT: normal Neck: no JVD, no carotid bruits, no masses Cardiac:  Normal S1/S2, RRR; no murmur , no rubs or gallops, no edema  Respiratory:  clear to auscultation bilaterally, no wheezing, rhonchi or rales. GI: soft, nontender, nondistended, + BS MS: no deformity or atrophy Skin: warm and dry  Neuro:  CNs II-XII intact, Strength and sensation are intact Psych: Normal affect   EKG:  EKG is ordered today.  It demonstrates:   NSR, HR 86, 1st degree AVB   Recent Labs: 08/31/2014: ALT 22; B Natriuretic Peptide 34.8; Platelets 219 09/03/2014: BUN 15; Creatinine 0.90; Hemoglobin 16.3; Potassium 3.8; Sodium 139    Lipid Panel    Component Value Date/Time   CHOL 137 11/30/2009 0835   TRIG 122.0 11/30/2009 0835   HDL 58.20 11/30/2009 0835   CHOLHDL 2 11/30/2009 0835   VLDL 24.4 11/30/2009 0835   LDLCALC 54 11/30/2009 0835      ASSESSMENT AND PLAN:  Dizziness:  I suspect his symptoms  were all related to sinus brady.  He has evidence of conduction system disease on his ECG with a 1st degree AVB.  He has not had a recurrence since stopping his beta-blocker. His monitor did not demonstrate any significant arrhythmias.  He should remain off of beta-blocker. No further changes.   Coronary artery disease involving native coronary artery of native heart without angina pectoris:  No angina.  Continue ASA, ACE inhibitor, statin.   Benign essential HTN:  Fair control.  Continue to monitor.  Continue Benazepril/HCTZ.  Hyperlipidemia:  Continue statin.    Current medicines are reviewed at length with the patient today.  Concerns regarding medicines are as outlined above.  The following changes  have been made:    None    Labs/ tests ordered today include:  Orders Placed This Encounter  Procedures  . EKG 12-Lead    Disposition:   FU with Dr. Jenkins Rouge 6 mos.    Signed, Versie Starks, MHS 10/15/2014 4:37 PM    [facility] [place], [place], [place]  [id number] Phone: [phone]; Fax: [phone]

## 2014-11-10 DIAGNOSIS — S92354D Nondisplaced fracture of fifth metatarsal bone, right foot, subsequent encounter for fracture with routine healing: Secondary | ICD-10-CM | POA: Diagnosis not present

## 2014-11-26 DIAGNOSIS — L821 Other seborrheic keratosis: Secondary | ICD-10-CM | POA: Diagnosis not present

## 2014-11-26 DIAGNOSIS — L570 Actinic keratosis: Secondary | ICD-10-CM | POA: Diagnosis not present

## 2014-11-26 DIAGNOSIS — D225 Melanocytic nevi of trunk: Secondary | ICD-10-CM | POA: Diagnosis not present

## 2014-11-26 DIAGNOSIS — Z85828 Personal history of other malignant neoplasm of skin: Secondary | ICD-10-CM | POA: Diagnosis not present

## 2014-12-05 ENCOUNTER — Telehealth: Payer: Self-pay | Admitting: Cardiovascular Disease

## 2014-12-05 NOTE — Telephone Encounter (Signed)
Called pt to see if he requested a 90 day supply of his medication Benazepril-HCT sent to [facility]. Pt already has 11 refills left on it at [facility] disp: 30 tablets. Pt stated that he did not a 90 day supply. I advised the pt that if he has any other problems, question or concerns to call the office. Pt verbalized understanding.

## 2015-01-27 DIAGNOSIS — L570 Actinic keratosis: Secondary | ICD-10-CM | POA: Diagnosis not present

## 2015-02-02 ENCOUNTER — Ambulatory Visit (INDEPENDENT_AMBULATORY_CARE_PROVIDER_SITE_OTHER): Payer: Medicare Other | Admitting: Family Medicine

## 2015-02-02 DIAGNOSIS — Z23 Encounter for immunization: Secondary | ICD-10-CM | POA: Diagnosis not present

## 2015-03-12 NOTE — Progress Notes (Signed)
Patient ID: [name] [name], male   DOB: 1936-02-09, 79 y.o.   MRN: [id number]    Cardiology Office Note   Date:  03/16/2015   ID:  [name] [name], DOB 08-17-1935, MRN [id number]  PCP:  Laurey Morale, MD  Cardiologist:  Dr. Jenkins Rouge    No chief complaint on file.    History of Present Illness: [name] [name] is a 79 y.o. male with a hx of CAD status post DES to the RCA in 2009, HTN, HL. He saw Ignacia Bayley, NP 09/02/14 after a recent emergency room visit for near-syncope/dizziness. This occurred while at church after taking his medications on an empty stomach. He had associated nausea and weakness. He denied chest pain. In the emergency room, he was found to be bradycardic with rates in the upper 40s and somewhat soft blood pressure (932 systolic). Cardiac enzymes were unremarkable. He was given IV fluids. Echocardiogram was arranged. This demonstrated normal LV function without significant valvular abnormalities. Six-month follow-up was recommended. However, he went back to the emergency room with another episode of dizziness. Heart rate was in the 40s and his carvedilol was discontinued. Event monitor was arranged. Apparently, according to the notes in the chart, this demonstrated NSR, PACs and no significant arrhythmia.  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 Wall motion was normal; Grade 1 diastolicdysfunction. - Aortic valve: Moderately calcified annulus. Trileaflet. Moderatethickening and calcification, consistent with sclerosis. - Mitral valve: There was mild regurgitation. - Pulmonic valve: There was trivial regurgitation.  LHC 11/2007 LM: Normal LAD: Proximal to mid 40-50%, ostial D1 40-50% LCx: Normal RCA: Distal 90% EF 60% PCI: DES to the RCA  Past Medical History  Diagnosis Date  . Abscess of  groin, left   . Hx MRSA infection   . Coronary artery disease     a. 11/2007 Cath/PCI: LM nl, LAD 40-10m, d1 40-50ost, LCX nl, RCA 90d (Platinum Study DES - promus), EF 60%.  . Hypertension   . H/O hiatal hernia   . Gastroesophageal reflux disease with hiatal hernia   . Cancer ([place])     basal cell, history of   . Arthritis     R hip & knees  . Pre-syncope     a. 08/2014 w/ sinus bradycardia in 40's in ED.  Marland Kitchen Closed fracture of right foot     a. 08/2014 Fx of R fifth metatarsal (Jones Fx).    Past Surgical History  Procedure Laterality Date  . Cardiac catheterization  12/06/07    60%  . Appendectomy    . Hernia repair    . Knee arthroscopy Right 10/03/2012    Procedure: RIGHT KNEEE ARTHROSCOPY WITH DEBRIDEMENT ;  Surgeon: Gearlean Alf, MD;  Location: [facility];  Service: Orthopedics;  Laterality: Right;  . Yag laser application      for glaucoma - both eyes, no drops as of yet   . Total hip arthroplasty Right 04/05/2013    Procedure: RIGHT TOTAL HIP ARTHROPLASTY ANTERIOR APPROACH;  Surgeon: Gearlean Alf, MD;  Location: [facility];  Service: Orthopedics;  Laterality: Right;  . Colonoscopy    . Coronary angioplasty with stent placement       Current Outpatient Prescriptions  Medication Sig Dispense Refill  . aspirin 325 MG tablet Take 325 mg by  mouth every morning.     . benazepril-hydrochlorthiazide (LOTENSIN HCT) 20-25 MG per tablet Take 1 tablet by mouth every morning. 30 tablet 11  . cholecalciferol (VITAMIN D) 1000 UNITS tablet Take 2,000 Units by mouth daily.    . Multiple Vitamin (MULTIVITAMIN) tablet Take 1 tablet by mouth daily.    . nitroGLYCERIN (NITROSTAT) 0.4 MG SL tablet Place 0.4 mg under the tongue every 5 (five) minutes as needed for chest pain (3 doses MAX).    Marland Kitchen rosuvastatin (CRESTOR) 20 MG tablet Take 1 tablet (20 mg total) by mouth every other day. 30 tablet 11   No current facility-administered medications for this visit.    Allergies:   Review of patient's  allergies indicates no known allergies.    Social History:  The patient  reports that he quit smoking about 53 years ago. He has never used smokeless tobacco. He reports that he drinks alcohol. He reports that he does not use illicit drugs.   Family History:  The patient's family history includes Cancer in his father; Heart disease in an other family member; Hypertension in his brother, sister, and another family member; Stomach cancer in an other family member. There is no history of Heart attack or Stroke.    ROS:   Please see the history of present illness.   Review of Systems  Neurological: Negative for dizziness.  All other systems reviewed and are negative.    PHYSICAL EXAM: VS:  BP 134/64 mmHg  Pulse 76  Ht 5\' 10"  (1.778 m)  Wt 69.763 kg (153 lb 12.8 oz)  BMI 22.07 kg/m2    Wt Readings from Last 3 Encounters:  03/16/15 69.763 kg (153 lb 12.8 oz)  10/15/14 71.215 kg (157 lb)  09/03/14 73.029 kg (161 lb)     GEN: Well nourished, well developed, in no acute distress HEENT: normal Neck: no JVD, no carotid bruits, no masses Cardiac:  Normal S1/S2, RRR; no murmur , no rubs or gallops, no edema  Respiratory:  clear to auscultation bilaterally, no wheezing, rhonchi or rales. GI: soft, nontender, nondistended, + BS MS: no deformity or atrophy Skin: warm and dry  Neuro:  CNs II-XII intact, Strength and sensation are intact Psych: Normal affect   EKG:  EKG is ordered today.  It demonstrates:   NSR, HR 86, 1st degree AVB   Recent Labs: 08/31/2014: ALT 22; B Natriuretic Peptide 34.8; Platelets 219 09/03/2014: BUN 15; Creatinine, Ser 0.90; Hemoglobin 16.3; Potassium 3.8; Sodium 139    Lipid Panel    Component Value Date/Time   CHOL 137 11/30/2009 0835   TRIG 122.0 11/30/2009 0835   HDL 58.20 11/30/2009 0835   CHOLHDL 2 11/30/2009 0835   VLDL 24.4 11/30/2009 0835   LDLCALC 54 11/30/2009 0835      ASSESSMENT AND PLAN:  Dizziness:  I suspect his symptoms were all  related to sinus brady.  He has evidence of conduction system disease on his ECG with a 1st degree AVB.  He has not had a recurrence since stopping his beta-blocker. His monitor did not demonstrate any significant arrhythmias.  He should remain off of beta-blocker. No further changes.   Coronary artery disease involving native coronary artery of native heart without angina pectoris:  No angina.  Continue ASA, ACE inhibitor, statin.   Benign essential HTN:  Fair control.  Continue to monitor.  Continue Benazepril/HCTZ.  Hyperlipidemia:  Continue statin.    Current medicines are reviewed at length with the patient today.  Concerns regarding  medicines are as outlined above.  The following changes have been made:    None    Labs/ tests ordered today include:  No orders of the defined types were placed in this encounter.    Disposition:   FU with me  6 mos.    Jenkins Rouge

## 2015-03-16 ENCOUNTER — Ambulatory Visit (INDEPENDENT_AMBULATORY_CARE_PROVIDER_SITE_OTHER): Payer: Medicare Other | Admitting: Cardiovascular Disease

## 2015-03-16 ENCOUNTER — Encounter: Payer: Self-pay | Admitting: Cardiovascular Disease

## 2015-03-16 VITALS — BP 134/64 | HR 76 | Ht 70.0 in | Wt 153.8 lb

## 2015-03-16 DIAGNOSIS — I10 Essential (primary) hypertension: Secondary | ICD-10-CM | POA: Diagnosis not present

## 2015-03-16 DIAGNOSIS — R001 Bradycardia, unspecified: Secondary | ICD-10-CM

## 2015-03-16 DIAGNOSIS — I2510 Atherosclerotic heart disease of native coronary artery without angina pectoris: Secondary | ICD-10-CM | POA: Diagnosis not present

## 2015-03-16 NOTE — Patient Instructions (Signed)

## 2015-03-31 ENCOUNTER — Other Ambulatory Visit: Payer: Self-pay

## 2015-03-31 MED ORDER — BENAZEPRIL-HYDROCHLOROTHIAZIDE 20-25 MG PO TABS: 1.0000 | ORAL_TABLET | Freq: Every morning | ORAL | Status: DC

## 2015-04-01 DIAGNOSIS — L570 Actinic keratosis: Secondary | ICD-10-CM | POA: Diagnosis not present

## 2015-04-08 DIAGNOSIS — R634 Abnormal weight loss: Secondary | ICD-10-CM | POA: Diagnosis not present

## 2015-04-08 DIAGNOSIS — R1310 Dysphagia, unspecified: Secondary | ICD-10-CM | POA: Diagnosis not present

## 2015-04-08 DIAGNOSIS — Z86010 Personal history of colonic polyps: Secondary | ICD-10-CM | POA: Diagnosis not present

## 2015-05-12 DIAGNOSIS — H18411 Arcus senilis, right eye: Secondary | ICD-10-CM | POA: Diagnosis not present

## 2015-05-12 DIAGNOSIS — H2512 Age-related nuclear cataract, left eye: Secondary | ICD-10-CM | POA: Diagnosis not present

## 2015-05-12 DIAGNOSIS — H1851 Endothelial corneal dystrophy: Secondary | ICD-10-CM | POA: Diagnosis not present

## 2015-05-12 DIAGNOSIS — I10 Essential (primary) hypertension: Secondary | ICD-10-CM | POA: Diagnosis not present

## 2015-05-12 DIAGNOSIS — H02839 Dermatochalasis of unspecified eye, unspecified eyelid: Secondary | ICD-10-CM | POA: Diagnosis not present

## 2015-06-03 DIAGNOSIS — L570 Actinic keratosis: Secondary | ICD-10-CM | POA: Diagnosis not present

## 2015-06-08 DIAGNOSIS — K449 Diaphragmatic hernia without obstruction or gangrene: Secondary | ICD-10-CM | POA: Diagnosis not present

## 2015-06-08 DIAGNOSIS — Z86010 Personal history of colonic polyps: Secondary | ICD-10-CM | POA: Diagnosis not present

## 2015-06-08 DIAGNOSIS — R1310 Dysphagia, unspecified: Secondary | ICD-10-CM | POA: Diagnosis not present

## 2015-06-08 DIAGNOSIS — K5730 Diverticulosis of large intestine without perforation or abscess without bleeding: Secondary | ICD-10-CM | POA: Diagnosis not present

## 2015-06-08 DIAGNOSIS — K210 Gastro-esophageal reflux disease with esophagitis: Secondary | ICD-10-CM | POA: Diagnosis not present

## 2015-06-08 HISTORY — PX: COLONOSCOPY: SHX174

## 2015-07-06 DIAGNOSIS — H2512 Age-related nuclear cataract, left eye: Secondary | ICD-10-CM | POA: Diagnosis not present

## 2015-07-06 DIAGNOSIS — H25812 Combined forms of age-related cataract, left eye: Secondary | ICD-10-CM | POA: Diagnosis not present

## 2015-07-07 DIAGNOSIS — H2511 Age-related nuclear cataract, right eye: Secondary | ICD-10-CM | POA: Diagnosis not present

## 2015-07-27 DIAGNOSIS — H2511 Age-related nuclear cataract, right eye: Secondary | ICD-10-CM | POA: Diagnosis not present

## 2015-07-27 DIAGNOSIS — H25811 Combined forms of age-related cataract, right eye: Secondary | ICD-10-CM | POA: Diagnosis not present

## 2015-09-03 ENCOUNTER — Other Ambulatory Visit: Payer: Self-pay | Admitting: Cardiovascular Disease

## 2015-09-15 ENCOUNTER — Encounter: Payer: Self-pay | Admitting: *Deleted

## 2015-09-15 ENCOUNTER — Ambulatory Visit: Payer: Medicare Other | Admitting: Cardiovascular Disease

## 2015-09-17 NOTE — Progress Notes (Signed)
Patient ID: [name] [name], male   DOB: [birth year], 80 y.o.   MRN: [id number]    Cardiology Office Note   Date:  09/18/2015   ID:  [name] [name], DOB 10/04/1935, MRN [id number]  PCP:  Laurey Morale, MD  Cardiologist:  Dr. Jenkins Rouge    Chief Complaint  Patient presents with  . Coronary Artery Disease     History of Present Illness: [name] [name] is a 80 y.o. male with a hx of CAD status post DES to the RCA in 2009, HTN, HL. He saw Ignacia Bayley, NP 09/02/14 after a recent emergency room visit for near-syncope/dizziness. This occurred while at church after taking his medications on an empty stomach. He had associated nausea and weakness. He denied chest pain. In the emergency room, he was found to be bradycardic with rates in the upper 40s and somewhat soft blood pressure (123XX123 systolic). Cardiac enzymes were unremarkable. He was given IV fluids. Echocardiogram was arranged. This demonstrated normal LV function without significant valvular abnormalities. Six-month follow-up was recommended. However, he went back to the emergency room with another episode of dizziness. Heart rate was in the 40s and his carvedilol was discontinued. Event monitor was arranged. Apparently, according to the notes in the chart, this demonstrated NSR, PACs and no significant arrhythmia.  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 Wall motion was normal; Grade 1 diastolicdysfunction. - Aortic valve: Moderately calcified annulus. Trileaflet. Moderatethickening and calcification, consistent with sclerosis. - Mitral valve: There was mild regurgitation. - Pulmonic valve: There was trivial regurgitation.  LHC 11/2007 LM: Normal LAD: Proximal to mid 40-50%, ostial D1 40-50% LCx: Normal RCA: Distal 90% EF 60% PCI: DES to the RCA  Still working  out at [employer] with no symptoms   Past Medical History  Diagnosis Date  . Abscess of groin, left   . Hx MRSA infection   . Coronary artery disease     a. 11/2007 Cath/PCI: LM nl, LAD 40-36m, d1 40-50ost, LCX nl, RCA 90d (Platinum Study DES - promus), EF 60%.  . Hypertension   . H/O hiatal hernia   . Gastroesophageal reflux disease with hiatal hernia   . Cancer ([place])     basal cell, history of   . Arthritis     R hip & knees  . Pre-syncope     a. 08/2014 w/ sinus bradycardia in 40's in ED.  Marland Kitchen Closed fracture of right foot     a. 08/2014 Fx of R fifth metatarsal (Jones Fx).    Past Surgical History  Procedure Laterality Date  . Cardiac catheterization  12/06/07    60%  . Appendectomy    . Hernia repair    . Knee arthroscopy Right 10/03/2012    Procedure: RIGHT KNEEE ARTHROSCOPY WITH DEBRIDEMENT ;  Surgeon: Gearlean Alf, MD;  [facility];  Service: Orthopedics;  Laterality: Right;  . Yag laser application      for glaucoma - both eyes, no drops as of yet   . Total hip arthroplasty Right 04/05/2013    Procedure: RIGHT TOTAL HIP ARTHROPLASTY ANTERIOR APPROACH;  Surgeon: Gearlean Alf, MD;  Location: [facility];  Service: Orthopedics;  Laterality: Right;  . Colonoscopy    . Coronary angioplasty with stent placement  Current Outpatient Prescriptions  Medication Sig Dispense Refill  . aspirin 325 MG tablet Take 325 mg by mouth every morning.     . benazepril-hydrochlorthiazide (LOTENSIN HCT) 20-25 MG tablet Take 1 tablet by mouth every morning. 90 tablet 3  . cholecalciferol (VITAMIN D) 1000 UNITS tablet Take 2,000 Units by mouth daily.    . Multiple Vitamin (MULTIVITAMIN) tablet Take 1 tablet by mouth daily.    . nitroGLYCERIN (NITROSTAT) 0.4 MG SL tablet Place 0.4 mg under the tongue every 5 (five) minutes as needed for chest pain (3 doses MAX).    Marland Kitchen rosuvastatin (CRESTOR) 20 MG tablet TAKE 1 TABLET (20 MG TOTAL) BY MOUTH EVERY OTHER DAY. 90 tablet 1     No current facility-administered medications for this visit.    Allergies:   Review of patient's allergies indicates no known allergies.    Social History:  The patient  reports that he quit smoking about 54 years ago. He has never used smokeless tobacco. He reports that he drinks alcohol. He reports that he does not use illicit drugs.   Family History:  The patient's family history includes Hypertension in his brother and sister; Stomach cancer in his father. There is no history of Heart attack or Stroke.    ROS:   Please see the history of present illness.   Review of Systems  Neurological: Negative for dizziness.  All other systems reviewed and are negative.    PHYSICAL EXAM: VS:  BP 118/72 mmHg  Pulse 72  Ht 5\' 10"  (1.778 m)  Wt 70.398 kg (155 lb 3.2 oz)  BMI 22.27 kg/m2    Wt Readings from Last 3 Encounters:  09/18/15 70.398 kg (155 lb 3.2 oz)  03/16/15 69.763 kg (153 lb 12.8 oz)  10/15/14 71.215 kg (157 lb)     GEN: Well nourished, well developed, in no acute distress HEENT: normal Neck: no JVD, no carotid bruits, no masses Cardiac:  Normal S1/S2, RRR; no murmur , no rubs or gallops, no edema  Respiratory:  clear to auscultation bilaterally, no wheezing, rhonchi or rales. GI: soft, nontender, nondistended, + BS MS: no deformity or atrophy Skin: warm and dry  Neuro:  CNs II-XII intact, Strength and sensation are intact Psych: Normal affect   EKG:  EKG is ordered today.  It demonstrates:   NSR, HR 86, 1st degree AVB   Recent Labs: No results found for requested labs within last 365 days.    Lipid Panel    Component Value Date/Time   CHOL 137 11/30/2009 0835   TRIG 122.0 11/30/2009 0835   HDL 58.20 11/30/2009 0835   CHOLHDL 2 11/30/2009 0835   VLDL 24.4 11/30/2009 0835   LDLCALC 54 11/30/2009 0835      ASSESSMENT AND PLAN:  Dizziness:  I suspect his symptoms were all related to sinus brady.  He has evidence of conduction system disease on his ECG  with a 1st degree AVB.  He has not had a recurrence since stopping his beta-blocker. His monitor did not demonstrate any significant arrhythmias.  He should remain off of beta-blocker. No further changes.   Coronary artery disease involving native coronary artery of native heart without angina pectoris:  No angina.  Continue ASA, ACE inhibitor, statin.   Benign essential HTN:  Fair control.  Continue to monitor.  Continue Benazepril/HCTZ.  Hyperlipidemia:  Continue statin.    Current medicines are reviewed at length with the patient today.  Concerns regarding medicines are as outlined above.  The  following changes have been made:    None    Labs/ tests ordered today include:  No orders of the defined types were placed in this encounter.    Disposition:   FU with me  A year    Jenkins Rouge

## 2015-09-18 ENCOUNTER — Ambulatory Visit (INDEPENDENT_AMBULATORY_CARE_PROVIDER_SITE_OTHER): Payer: Medicare Other | Admitting: Cardiovascular Disease

## 2015-09-18 ENCOUNTER — Encounter: Payer: Self-pay | Admitting: Cardiovascular Disease

## 2015-09-18 VITALS — BP 118/72 | HR 72 | Ht 70.0 in | Wt 155.2 lb

## 2015-09-18 DIAGNOSIS — I2510 Atherosclerotic heart disease of native coronary artery without angina pectoris: Secondary | ICD-10-CM | POA: Diagnosis not present

## 2015-09-18 NOTE — Patient Instructions (Addendum)

## 2015-12-07 DIAGNOSIS — L821 Other seborrheic keratosis: Secondary | ICD-10-CM | POA: Diagnosis not present

## 2015-12-07 DIAGNOSIS — D225 Melanocytic nevi of trunk: Secondary | ICD-10-CM | POA: Diagnosis not present

## 2015-12-07 DIAGNOSIS — Z85828 Personal history of other malignant neoplasm of skin: Secondary | ICD-10-CM | POA: Diagnosis not present

## 2015-12-07 DIAGNOSIS — L570 Actinic keratosis: Secondary | ICD-10-CM | POA: Diagnosis not present

## 2016-01-12 ENCOUNTER — Emergency Department (HOSPITAL_COMMUNITY)
Admission: EM | Admit: 2016-01-12 | Discharge: 2016-01-12 | Disposition: A | Discharge status: Home / Self Care | Payer: Medicare Other | Attending: Emergency Medicine | Admitting: Emergency Medicine

## 2016-01-12 ENCOUNTER — Ambulatory Visit: Payer: Medicare Other | Admitting: Physician Assistant

## 2016-01-12 ENCOUNTER — Other Ambulatory Visit (INDEPENDENT_AMBULATORY_CARE_PROVIDER_SITE_OTHER): Payer: Medicare Other | Admitting: *Deleted

## 2016-01-12 ENCOUNTER — Emergency Department (HOSPITAL_COMMUNITY): Payer: Medicare Other

## 2016-01-12 ENCOUNTER — Telehealth: Payer: Self-pay

## 2016-01-12 ENCOUNTER — Encounter (HOSPITAL_COMMUNITY): Payer: Self-pay | Admitting: *Deleted

## 2016-01-12 DIAGNOSIS — R404 Transient alteration of awareness: Secondary | ICD-10-CM | POA: Diagnosis not present

## 2016-01-12 DIAGNOSIS — I2510 Atherosclerotic heart disease of native coronary artery without angina pectoris: Secondary | ICD-10-CM | POA: Diagnosis not present

## 2016-01-12 DIAGNOSIS — Z96641 Presence of right artificial hip joint: Secondary | ICD-10-CM | POA: Insufficient documentation

## 2016-01-12 DIAGNOSIS — I441 Atrioventricular block, second degree: Secondary | ICD-10-CM | POA: Insufficient documentation

## 2016-01-12 DIAGNOSIS — Z7982 Long term (current) use of aspirin: Secondary | ICD-10-CM | POA: Insufficient documentation

## 2016-01-12 DIAGNOSIS — I440 Atrioventricular block, first degree: Secondary | ICD-10-CM | POA: Diagnosis not present

## 2016-01-12 DIAGNOSIS — Z87891 Personal history of nicotine dependence: Secondary | ICD-10-CM | POA: Diagnosis not present

## 2016-01-12 DIAGNOSIS — I459 Conduction disorder, unspecified: Secondary | ICD-10-CM | POA: Diagnosis not present

## 2016-01-12 DIAGNOSIS — I10 Essential (primary) hypertension: Secondary | ICD-10-CM | POA: Diagnosis not present

## 2016-01-12 DIAGNOSIS — R42 Dizziness and giddiness: Secondary | ICD-10-CM | POA: Diagnosis not present

## 2016-01-12 DIAGNOSIS — I499 Cardiac arrhythmia, unspecified: Secondary | ICD-10-CM | POA: Diagnosis not present

## 2016-01-12 DIAGNOSIS — Z955 Presence of coronary angioplasty implant and graft: Secondary | ICD-10-CM | POA: Diagnosis not present

## 2016-01-12 DIAGNOSIS — R110 Nausea: Secondary | ICD-10-CM | POA: Diagnosis not present

## 2016-01-12 LAB — I-STAT TROPONIN, ED: TROPONIN I, POC: 0.01 ng/mL (ref 0.00–0.08)

## 2016-01-12 LAB — BASIC METABOLIC PANEL WITH GFR
Anion gap: 8 (ref 5–15)
BUN: 14 mg/dL (ref 6–20)
CO2: 28 mmol/L (ref 22–32)
Calcium: 10 mg/dL (ref 8.9–10.3)
Chloride: 103 mmol/L (ref 101–111)
Creatinine, Ser: 0.87 mg/dL (ref 0.61–1.24)
GFR calc Af Amer: >60 mL/min
GFR calc non Af Amer: >60 mL/min
Glucose, Bld: 83 mg/dL (ref 65–99)
Potassium: 3.7 mmol/L (ref 3.5–5.1)
Sodium: 139 mmol/L (ref 135–145)

## 2016-01-12 LAB — CBC WITH DIFFERENTIAL/PLATELET
BASOS PCT: 0 %
Basophils Absolute: 0 10*3/uL (ref 0.0–0.1)
EOS ABS: 0.1 10*3/uL (ref 0.0–0.7)
EOS PCT: 1 %
HCT: 50.1 % (ref 39.0–52.0)
HEMOGLOBIN: 17.4 g/dL — AB (ref 13.0–17.0)
Lymphocytes Relative: 20 %
Lymphs Abs: 1.3 10*3/uL (ref 0.7–4.0)
MCH: 34 pg (ref 26.0–34.0)
MCHC: 34.7 g/dL (ref 30.0–36.0)
MCV: 97.9 fL (ref 78.0–100.0)
Monocytes Absolute: 0.7 10*3/uL (ref 0.1–1.0)
Monocytes Relative: 11 %
NEUTROS PCT: 68 %
Neutro Abs: 4.5 10*3/uL (ref 1.7–7.7)
PLATELETS: 258 10*3/uL (ref 150–400)
RBC: 5.12 MIL/uL (ref 4.22–5.81)
RDW: 12.6 % (ref 11.5–15.5)
WBC: 6.6 10*3/uL (ref 4.0–10.5)

## 2016-01-12 LAB — MAGNESIUM: Magnesium: 2.1 mg/dL (ref 1.7–2.4)

## 2016-01-12 NOTE — Telephone Encounter (Signed)
Patient walk-in to see if he could get an appointment with Dr. Johnsie Cancel today. Patient stated he is having nausea and dizziness. Patient denied chest pain or SOB.  Informed patient about our walk-in policy, and that we could refer him to the ED, and if he thinks he needs EMS we could call them for him. Patient was also offered the first available appointment with Melina Copa PA at 10:00 am today. Patient agreed to see the PA, and patient and his wife were insistent on not going to the hospital. Patient went ahead and checked in for his office visit and waited in the lobby to be seen. Around 9:10 patient complained to check-in desk that he felt worse and wondered if EMS should be called. Check out called triage nurse to come see patient. Escorted patient to a room to get vital signs and assess patient. Patient ambulated with no difficulty to the room. Patient appeared very anxious. Took patient's BP 182/86 while sitting, HR with pulse ox was 48, O2 97% on room air. Patient radial pulse irregular. Decided to get an EKG with patient's HR being so low and having an irregular pulse. At this point, after consulting with PA and management, EMS was called. EKG showed patient's HR was running between 60's and 70's. Last EKG in office read NS with 1st Degree AVB. Today's EKG showed NS with 1st Degree AVB with PVC and PAC HR 72. [employer] came since EMS was delayed, BP was rechecked 148/80. EMS arrived shortly after [employer], they transferred patient to ED. Patient seemed less anxious after EMS arrived.

## 2016-01-12 NOTE — Discharge Instructions (Signed)
Continue your regular home medications. Follow-up with your cardiologist.   Return to the ED for new or worsening symptoms.

## 2016-01-12 NOTE — ED Notes (Signed)
Patient ambulated to the bathroom without any difficulty or distress; wife at bedside

## 2016-01-12 NOTE — ED Triage Notes (Signed)
Pt was at his PCP this morning. His EKG stated that he was in second degree type II. Heart Rate was in lower 40's.  Pt was sent to ER from PCP. VS  Are as follows: BP: 130/74 HR in 60's SPO2: 96% on RA. No chest pain

## 2016-01-12 NOTE — Consult Note (Signed)
Cardiology Consult    Patient ID: [name] [name] MRN: [id number], DOB/AGE: 1935-06-04   Admit date: 01/12/2016 Date of Consult: 01/12/2016  Primary Physician: Laurey Morale, MD Primary Cardiologist: Dr. Johnsie Cancel Requesting Provider: Quincy Carnes PA-C  Reason for Consultation: Dizziness, palpitations  Patient Profile    80 yo male with past medical history of CAD s/p DES to the RCA (2009), hypertension, hyperlipidemia, and palpitations to presented to the [facility] ED on 8/29 with reports of dizziness, and lightheadedness.  Past Medical History   Past Medical History:  Diagnosis Date  . Abscess of groin, left   . Arthritis    R hip & knees  . Cancer ([place])    basal cell, history of   . Closed fracture of right foot    a. 08/2014 Fx of R fifth metatarsal (Jones Fx).  . Coronary artery disease    a. 11/2007 Cath/PCI: LM nl, LAD 40-45m, d1 40-50ost, LCX nl, RCA 90d (Platinum Study DES - promus), EF 60%.  . Gastroesophageal reflux disease with hiatal hernia   . H/O hiatal hernia   . Hx MRSA infection   . Hypertension   . Pre-syncope    a. 08/2014 w/ sinus bradycardia in 40's in ED.    Past Surgical History:  Procedure Laterality Date  . APPENDECTOMY    . CARDIAC CATHETERIZATION  12/06/07   60%  . COLONOSCOPY    . CORONARY ANGIOPLASTY WITH STENT PLACEMENT    . HERNIA REPAIR    . KNEE ARTHROSCOPY Right 10/03/2012   Procedure: RIGHT KNEEE ARTHROSCOPY WITH DEBRIDEMENT ;  Surgeon: Gearlean Alf, MD;  Location: [facility];  Service: Orthopedics;  Laterality: Right;  . TOTAL HIP ARTHROPLASTY Right 04/05/2013   Procedure: RIGHT TOTAL HIP ARTHROPLASTY ANTERIOR APPROACH;  Surgeon: Gearlean Alf, MD;  Location: [place];  Service: Orthopedics;  Laterality: Right;  . YAG LASER APPLICATION     for glaucoma - both eyes, no drops as of yet      Allergies  No Known Allergies  History of Present Illness    Mr. [name] [name] is a 80 yo male patient of Dr. Johnsie Cancel with past history of CAD s/p  DES to the RCA (2009), hypertension, hyperlipidemia, and palpitations. He was seen in the office on 09/02/14 after an ER visit for near-syncope/dizziness, that occurred while he was at church after taking his medications on into the stomach. Had associated nausea and weakness, but denied any chest pain. During that visit he was found to be bradycardic with heart rates in the upper 40s, and soft blood pressure ([id number] systolic). His cardiac enzymes were negative, and he was given IV fluids. Echocardiogram showed normal LV function without significant valvular abnormalities. He was scheduled to return for 6 month follow-up, but presented to the ER for another episode of dizziness prior to this visit. Heart rate was again noted in the 40s and his carvedilol was discontinued. Event monitor was arranged after this visit. No significant arrhythmias were noted on the monitor only PACs, and PVCs. He is followed up in the office in 6/16 by Richardson Dopp and reported doing much better since having stopped his beta blocker.   During his last office visit on 09/17/15 it was reported by Dr. Johnsie Cancel that his symptoms were likely related to sinus bradycardia. He did display evidence of conduction disease on his EKG with first-degree AV block. He was to continue to remain off his beta blocker at that time, with no further changes. It was  discussed with the patient he should follow-up in office within a year.   Patient reports over the weekend that he felt slightly under the weather, and was experiencing a GI bug. Reports he had multiple episodes of nausea along with diarrhea. States he was attempting to drink adequate fluids along with eating soup. States he was also monitoring his blood pressure over the past couple of days and noticed that his systolic number was in the 180s. Reports he experienced another symptom of lightheadedness early this morning while at rest. Attempted to call the office for a walk-in appointment, and was  unable to talk with anyone on the phone. Him and his wife proceeded to drive to the office and were added on to the clinic schedule at 10 AM. While sitting in the lobby reports that he began to feel lightheaded, and somewhat dizzy again. EKG was performed showing known first-degree AV block, with PACs and PVCs with rate in the 70s. At that time EMS was called, and he was transported to [facility] ER for further evaluation. He denies any episodes of chest pain, dyspnea, diaphoresis, or palpitations.   In the ER his labs showed normal electrolytes, elevated hemoglobin of 17.6, negative POC troponin. Chest x-ray was negative. EKG again showed known first-degree AV block, with PVCs, and rate in the 70s. He has remained without any chest pain, or other associated symptoms since arrival to the ER. Review of telemetry does show continued, frequent PVCs. He has ambulated in the department without any difficulty or distress.  Inpatient Medications      Family History    Family History  Problem Relation Age of Onset  . Hypertension Sister   . Hypertension Brother   . Stomach cancer Father     family hx  . Hypertension      family hx  . Heart disease      family hx  . Heart attack Neg Hx   . Stroke Neg Hx     Social History    Social History   Social History  . Marital status: Married    Spouse name: N/A  . Number of children: N/A  . Years of education: N/A   Occupational History  . Not on file.   Social History Main Topics  . Smoking status: Former Smoker    Quit date: 09/28/1961  . Smokeless tobacco: Never Used  . Alcohol use 0.0 oz/week     Comment: occ  . Drug use: No  . Sexual activity: Not on file   Other Topics Concern  . Not on file   Social History Narrative  . No narrative on file     Review of Systems    General:  No chills, fever, night sweats or weight changes.  Cardiovascular:  See HPI Dermatological: No rash, lesions/masses Respiratory: No cough,  dyspnea Urologic: No hematuria, dysuria Abdominal:   See HPI Neurologic:  No visual changes, wkns, changes in mental status. All other systems reviewed and are otherwise negative except as noted above.  Physical Exam    Blood pressure 130/71, pulse 79, temperature 97.7 F (36.5 C), temperature source Oral, resp. rate 20, height 5\' 10"  (1.778 m), weight 155 lb (70.3 kg), SpO2 98 %.  General: Pleasant, NAD Psych: Normal affect. Neuro: Alert and oriented X 3. Moves all extremities spontaneously. HEENT: Normal  Neck: Supple without bruits or JVD. Lungs:  Resp regular and unlabored, CTA. Heart: RRR no s3, s4, 2/6 systolic murmur. Abdomen: Soft, non-tender, non-distended, BS +  x 4.  Extremities: No clubbing, cyanosis or edema. DP/PT/Radials 2+ and equal bilaterally.  Labs    Troponin [facility] of Care Test)  Recent Labs  01/12/16 1104  TROPIPOC 0.01   No results for input(s): CKTOTAL, CKMB, TROPONINI in the last 72 hours. Lab Results  Component Value Date   WBC 6.6 01/12/2016   HGB 17.4 (H) 01/12/2016   HCT 50.1 01/12/2016   MCV 97.9 01/12/2016   PLT 258 01/12/2016    Recent Labs Lab 01/12/16 1050  NA 139  K 3.7  CL 103  CO2 28  BUN 14  CREATININE 0.87  CALCIUM 10.0  GLUCOSE 83   Lab Results  Component Value Date   CHOL 137 11/30/2009   HDL 58.20 11/30/2009   LDLCALC 54 11/30/2009   TRIG 122.0 11/30/2009   No results found for: [facility]   Radiology Studies    Dg Chest 2 View  Result Date: 01/12/2016 CLINICAL DATA:  Heart block EXAM: CHEST  2 VIEW COMPARISON:  08/31/2014 FINDINGS: Heart size upper normal. Negative for heart failure. Lungs are clear without infiltrate effusion or mass. No acute skeletal abnormality. Mild apical scarring bilaterally. IMPRESSION: No active cardiopulmonary disease. Electronically Signed   By: Franchot Gallo M.D.   On: 01/12/2016 11:26    ECG & Cardiac Imaging    EKG: SR, 1st degree AV block  Echo: 09/08/2014  Study  Conclusions  - Left ventricle: The cavity size was normal. Systolic function was   normal. The estimated ejection fraction was in the range of 55%   to 60%. Wall motion was normal; there were no regional wall   motion abnormalities. Doppler parameters are consistent with   abnormal left ventricular relaxation (grade 1 diastolic   dysfunction). - Aortic valve: Moderately calcified annulus. Trileaflet. Moderate   thickening and calcification, consistent with sclerosis. - Mitral valve: There was mild regurgitation. - Pulmonic valve: There was trivial regurgitation.   Assessment & Plan    80 yo male with past medical history of CAD s/p DES to the RCA (2009), hypertension, hyperlipidemia, and palpitations to presented to the [facility] ED on 8/29 with reports of dizziness, and light-headedness.  1. Dizziness/light-headedness: Reports having similar symptoms last year that resolved after stopping his BB. Reported feeling well at his last office visit with Dr. Johnsie Cancel in 5/17, and no changes were made to his medications. Had a GI symptoms over the weekend, attempted to stay hydrated with fluids and soup. Began having symptoms similar to those last year, with associated light-headedness and dizziness over the past couple of days. Also reports his BP has been elevated with systolic BP in the [id number]. Reports he had one episode this morning, attempted to be seen in the office but became symptomatic while waiting. Presented to the ER, EKG showed known 1st degree AV block with PVCs and PACs. Labs showed normal electrolytes, neg POC trop, and slightly elevated Hgb. Has remained without symptoms since admission. Telemetry shows freq PVCs, and PACs. -- Likely multifactorial in nature given recent illness. Remains asymptomatic at this time. Discussed with patient to follow up in the office, no changes to medications at this time. Will make follow up appt.   Barnet Pall, NP-C Pager  [phone] 01/12/2016, 2:35 PM   History and all data above reviewed.  Patient examined.  I agree with the findings as above.  The patient is having dizziness after a GI illness recently.  He was going to be seen in the office but he  became presyncopal there.  Came to the ED.  No acute abnormalities as mentioned above.  Feels OK.  No orthostasis.  No chest pain.    The patient exam reveals COR:RRR  ,  Lungs: Clear  ,  Abd: Positive bowel sounds, no rebound no guarding, Ext No edema  .  All available labs, radiology testing, previous records reviewed. Agree with documented assessment and plan. Presyncope:  Likely related to recent GI illness.  No acute cardiac findings.  No new arrhythmias.  He has had PACs and PVCs without symptoms.  No further cardiac testing is indicated at this point.  He can follow up with Dr. Johnsie Cancel.    Jeneen Rinks Harshith Pursell  3:25 PM  01/12/2016

## 2016-01-12 NOTE — ED Notes (Signed)
Patient Alert and oriented X4. Stable and ambulatory. Patient verbalized understanding of the discharge instructions.  Patient belongings were taken by the patient.  

## 2016-01-12 NOTE — ED Notes (Signed)
Patient has returned from the bathroom at this time; placed back on monitor, continuous pulse oximetry and blood pressure cuff

## 2016-01-12 NOTE — ED Provider Notes (Signed)
[facility] DEPT Provider Note   CSN: [id number] Arrival date & time: 01/12/16  1004     History   Chief Complaint Chief Complaint  Patient presents with  . Heart Problem    HPI [name] [name] is a 80 y.o. male.  The history is provided by the patient, the spouse and medical records.  Heart Problem    80 year old male with history of arthritis, coronary artery disease status post stenting in 2009, GERD, hypertension, presenting to the ED for new heart block. Patient states over the weekend he was a little under the weather with a GI bug. States he mostly had nausea and diarrhea. States today he was feeling better and went to an appointment with his PCP. He states there he began feeling lightheaded and somewhat dizzy. They performed an EKG which showed a new second-degree heart block with heart rate in the 40s. EMS was called. Heart rate stabilized and remained 60-70 with EMS. On arrival here, patient states he feels back to his baseline. His heart rate remained stable in the 60s and 70s currently. He has not had any chest pain or shortness of breath. No diaphoresis or feelings of syncope. He reports he has a known history of first-degree heart block and PVCs. He is followed by cardiology, Dr. Johnsie Cancel.  States he was told that he may need a pacemaker at some point because of this, but overall has been doing well recently.  Past Medical History:  Diagnosis Date  . Abscess of groin, left   . Arthritis    R hip & knees  . Cancer ([place])    basal cell, history of   . Closed fracture of right foot    a. 08/2014 Fx of R fifth metatarsal (Jones Fx).  . Coronary artery disease    a. 11/2007 Cath/PCI: LM nl, LAD 40-62m, d1 40-50ost, LCX nl, RCA 90d (Platinum Study DES - promus), EF 60%.  . Gastroesophageal reflux disease with hiatal hernia   . H/O hiatal hernia   . Hx MRSA infection   . Hypertension   . Pre-syncope    a. 08/2014 w/ sinus bradycardia in 40's in ED.    Patient Active  Problem List   Diagnosis Date Noted  . Pre-syncope   . Coronary artery disease   . OA (osteoarthritis) of hip 04/05/2013  . Lateral meniscal tear 10/03/2012  . CONTACT DERMATITIS 12/12/2008  . APHTHOUS ULCERS 09/22/2008  . BOILS, RECURRENT 02/18/2008  . Coronary atherosclerosis 12/10/2007  . Elevated lipids 02/28/2007  . Essential hypertension 02/28/2007    Past Surgical History:  Procedure Laterality Date  . APPENDECTOMY    . CARDIAC CATHETERIZATION  12/06/07   60%  . COLONOSCOPY    . CORONARY ANGIOPLASTY WITH STENT PLACEMENT    . HERNIA REPAIR    . KNEE ARTHROSCOPY Right 10/03/2012   Procedure: RIGHT KNEEE ARTHROSCOPY WITH DEBRIDEMENT ;  Surgeon: Gearlean Alf, MD;  Location: [facility];  Service: Orthopedics;  Laterality: Right;  . TOTAL HIP ARTHROPLASTY Right 04/05/2013   Procedure: RIGHT TOTAL HIP ARTHROPLASTY ANTERIOR APPROACH;  Surgeon: Gearlean Alf, MD;  Location: [place];  Service: Orthopedics;  Laterality: Right;  . YAG LASER APPLICATION     for glaucoma - both eyes, no drops as of yet        Home Medications    Prior to Admission medications   Medication Sig Start Date End Date Taking? Authorizing Provider  aspirin 325 MG tablet Take 325 mg by mouth every  morning.     Historical Provider, MD  benazepril-hydrochlorthiazide (LOTENSIN HCT) 20-25 MG tablet Take 1 tablet by mouth every morning. 03/31/15   [name], MD  cholecalciferol (VITAMIN D) 1000 UNITS tablet Take 2,000 Units by mouth daily.    Historical Provider, MD  Multiple Vitamin (MULTIVITAMIN) tablet Take 1 tablet by mouth daily.    Historical Provider, MD  nitroGLYCERIN (NITROSTAT) 0.4 MG SL tablet Place 0.4 mg under the tongue every 5 (five) minutes as needed for chest pain (3 doses MAX).    Historical Provider, MD  rosuvastatin (CRESTOR) 20 MG tablet TAKE 1 TABLET (20 MG TOTAL) BY MOUTH EVERY OTHER DAY. 09/03/15   [name], MD    Family History Family History  Problem Relation Age of Onset    . Hypertension Sister   . Hypertension Brother   . Stomach cancer Father     family hx  . Hypertension      family hx  . Heart disease      family hx  . Heart attack Neg Hx   . Stroke Neg Hx     Social History Social History  Substance Use Topics  . Smoking status: Former Smoker    Quit date: 09/28/1961  . Smokeless tobacco: Never Used  . Alcohol use 0.0 oz/week     Comment: occ     Allergies   Review of patient's allergies indicates no known allergies.   Review of Systems Review of Systems  Neurological: Positive for light-headedness.  All other systems reviewed and are negative.    Physical Exam Updated Vital Signs BP 140/92 (BP Location: Right Arm)   Pulse 78   Temp 97.7 F (36.5 C) (Oral)   Resp 20   Ht 5\' 10"  (1.778 m)   Wt 70.3 kg   SpO2 100%   BMI 22.24 kg/m   Physical Exam  Constitutional: He is oriented to person, place, and time. He appears well-developed and well-nourished.  Elderly, NAD  HENT:  Head: Normocephalic and atraumatic.  Mouth/Throat: Oropharynx is clear and moist.  Eyes: Conjunctivae and EOM are normal. Pupils are equal, round, and reactive to light.  Neck: Normal range of motion.  Cardiovascular: Normal rate, regular rhythm, normal heart sounds and normal pulses.   HR 60's- 70's on monitor, occasional PVC's noted  Pulmonary/Chest: Effort normal and breath sounds normal.  Abdominal: Soft. Bowel sounds are normal.  Musculoskeletal: Normal range of motion.  Neurological: He is alert and oriented to person, place, and time.  AAOx3, answering questions and following commands appropriately; equal strength UE and LE bilaterally; CN grossly intact; moves all extremities appropriately without ataxia; no focal neuro deficits or facial asymmetry appreciated  Skin: Skin is warm and dry.  Psychiatric: He has a normal mood and affect.  Nursing note and vitals reviewed.    ED Treatments / Results  Labs (all labs ordered are listed, but  only abnormal results are displayed) Labs Reviewed  CBC WITH DIFFERENTIAL/PLATELET - Abnormal; Notable for the following:       Result Value   Hemoglobin 17.4 (*)    All other components within normal limits  BASIC METABOLIC PANEL  MAGNESIUM  I-STAT TROPOININ, ED    EKG  EKG Interpretation  Date/Time:  Tuesday January 12 2016 10:12:28 EDT Ventricular Rate:  70 PR Interval:    QRS Duration: 81 QT Interval:  417 QTC Calculation: 444 R Axis:   24 Text Interpretation:  Sinus rhythm Multiform ventricular premature complexes Prolonged PR interval  Abnormal R-wave progression, early transition variable PR interval Confirmed by Regenia Skeeter MD, SCOTT [phone]) on 01/12/2016 10:31:28 AM       Radiology Dg Chest 2 View  Result Date: 01/12/2016 CLINICAL DATA:  Heart block EXAM: CHEST  2 VIEW COMPARISON:  08/31/2014 FINDINGS: Heart size upper normal. Negative for heart failure. Lungs are clear without infiltrate effusion or mass. No acute skeletal abnormality. Mild apical scarring bilaterally. IMPRESSION: No active cardiopulmonary disease. Electronically Signed   By: Franchot Gallo M.D.   On: 01/12/2016 11:26    Procedures Procedures (including critical care time)  Medications Ordered in ED Medications - No data to display   Initial Impression / Assessment and Plan / ED Course  I have reviewed the triage vital signs and the nursing notes.  Pertinent labs & imaging results that were available during my care of the patient were reviewed by me and considered in my medical decision making (see chart for details).  Clinical Course   80 year old male here with new second-degree heart block.  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  He has hx of first degree. His HR is stable here in the 60's- 70's with multiple PVC's present (hx of same).  Remains without any chest pain or shortness of breath. States he is not currently experiencing any of the lightheadedness or dizziness he was  having in his doctor's office. His labs here are reassuring. His chest x-ray is clear. Patient did report that he was told he may eventually need a pacemaker. He has wife are very concerned about this episode today. Will speak with cardiology further recommendations.  Cardiology, Dr. Percival Spanish, has evaluated patient-- He agrees that this may be a Wenckebach phenomenon following his PVCs. No further intervention recommended today. May follow up in clinic with his regular cardiologist.  This was discussed with Patient and his wife, they are comfortable with this plan. Return precautions were given for any new or worsening symptoms.  Final Clinical Impressions(s) / ED Diagnoses   Final diagnoses:  Heart block    New Prescriptions Discharge Medication List as of 01/12/2016  3:31 PM       Larene Pickett, PA-C 01/12/16 1620    Sherwood Gambler, MD 01/13/16 1504

## 2016-01-12 NOTE — ED Notes (Signed)
Patient up ambulatory to the bathroom without any difficulty or distress

## 2016-01-13 ENCOUNTER — Encounter: Payer: Self-pay | Admitting: *Deleted

## 2016-01-15 ENCOUNTER — Ambulatory Visit (INDEPENDENT_AMBULATORY_CARE_PROVIDER_SITE_OTHER): Payer: Medicare Other | Admitting: Nurse Practitioner

## 2016-01-15 ENCOUNTER — Encounter (INDEPENDENT_AMBULATORY_CARE_PROVIDER_SITE_OTHER): Payer: Self-pay

## 2016-01-15 ENCOUNTER — Encounter: Payer: Self-pay | Admitting: Nurse Practitioner

## 2016-01-15 VITALS — BP 110/70 | HR 76 | Ht 70.5 in | Wt 154.4 lb

## 2016-01-15 DIAGNOSIS — R001 Bradycardia, unspecified: Secondary | ICD-10-CM

## 2016-01-15 DIAGNOSIS — R42 Dizziness and giddiness: Secondary | ICD-10-CM

## 2016-01-15 DIAGNOSIS — I2510 Atherosclerotic heart disease of native coronary artery without angina pectoris: Secondary | ICD-10-CM | POA: Diagnosis not present

## 2016-01-15 DIAGNOSIS — I493 Ventricular premature depolarization: Secondary | ICD-10-CM

## 2016-01-15 NOTE — Patient Instructions (Addendum)
We will be checking the following labs today - NONE   Medication Instructions:    Continue with your current medicines.     Testing/Procedures To Be Arranged:  N/A  Follow-Up:   See Dr. Johnsie Cancel next May as planned.     Other Special Instructions:   N/A    If you need a refill on your cardiac medications before your next appointment, please call your pharmacy.   Call the [place] office at [phone] if you have any questions, problems or concerns.

## 2016-01-15 NOTE — Progress Notes (Signed)
CARDIOLOGY OFFICE NOTE  Date:  01/15/2016    [name] [name] Date of Birth: 1935/12/30 Medical Record [id number]  PCP:  Laurey Morale, MD  Cardiologist:  Johnsie Cancel  Chief Complaint  Patient presents with  . Dizziness    Post ER visit - seen for Dr. Johnsie Cancel    History of Present Illness: [name] [name] is a 80 y.o. male who presents today for a post ER visit. Seen for Dr. Johnsie Cancel.   He has a hx of CAD status post DES to the RCA in 2009, HTN, & HLD. He saw Ignacia Bayley, NP 09/02/14 after a recent emergency room visit for near-syncope/dizziness. This occurred while at church after taking his medications on an empty stomach. He had associated nausea and weakness. He denied chest pain. In the emergency room, he was found to be bradycardic with rates in the upper 40s and somewhat soft blood pressure ([id number] systolic). Cardiac enzymes were unremarkable. He was given IV fluids. Echocardiogram was arranged. This demonstrated normal LV function without significant valvular abnormalities. Six-month follow-up was recommended. However, he went back to the emergency room with another episode of dizziness. Heart rate was in the 40s and his carvedilol was discontinued. Event monitor was arranged. Apparently, according to the notes in the chart, this demonstrated NSR, PACs and no significant arrhythmia.  Seen by Dr. Johnsie Cancel back in May. Felt to be doing ok and no changes made.   Back in the ER earlier this week with reports of dizziness and lightheadedness. Seen by Dr. Percival Spanish. Had had a GI bug with multiple episodes of nausea with diarrhea. BP high. Tried to call her for an appointment and was unable to talk with anyone so drove to the office - while in the lobby began to feel lightheaded and dizzy. EKG with 1st degree AV block and PACs/PVCs with rate in the 70s. Transferred by EMS. Frequent PVCs noted on telemetry in the ER. His symptoms were felt to be multifactorial in nature given recent GI illness -  no changes with his medicines were made and he was asked to follow back up here.   Comes in today. Here alone. Upset that he was told to schedule a visit with Dr. Johnsie Cancel "as soon as possible" and there is no availability. Lots of questions. Says he is feeling better. He has tried to maintain his hydration. He is no longer dizzy. Back to all of his usual routines. No chest pain. Breathing ok. BP back down.   Past Medical History:  Diagnosis Date  . Abscess of groin, left   . Arthritis    R hip & knees  . Cancer ([place])    basal cell, history of   . Closed fracture of right foot    a. 08/2014 Fx of R fifth metatarsal (Jones Fx).  . Coronary artery disease    a. 11/2007 Cath/PCI: LM nl, LAD 40-53m, d1 40-50ost, LCX nl, RCA 90d (Platinum Study DES - promus), EF 60%.  . Gastroesophageal reflux disease with hiatal hernia   . H/O hiatal hernia   . Hx MRSA infection   . Hypertension   . Pre-syncope    a. 08/2014 w/ sinus bradycardia in 40's in ED.    Past Surgical History:  Procedure Laterality Date  . APPENDECTOMY    . CARDIAC CATHETERIZATION  12/06/07   60%  . COLONOSCOPY    . CORONARY ANGIOPLASTY WITH STENT PLACEMENT    . HERNIA REPAIR    . KNEE ARTHROSCOPY Right  10/03/2012   Procedure: RIGHT KNEEE ARTHROSCOPY WITH DEBRIDEMENT ;  Surgeon: Gearlean Alf, MD;  Location: [facility];  Service: Orthopedics;  Laterality: Right;  . TOTAL HIP ARTHROPLASTY Right 04/05/2013   Procedure: RIGHT TOTAL HIP ARTHROPLASTY ANTERIOR APPROACH;  Surgeon: Gearlean Alf, MD;  Location: [facility];  Service: Orthopedics;  Laterality: Right;  . YAG LASER APPLICATION     for glaucoma - both eyes, no drops as of yet      Medications: Current Outpatient Prescriptions  Medication Sig Dispense Refill  . aspirin 325 MG tablet Take 325 mg by mouth every morning.     . benazepril-hydrochlorthiazide (LOTENSIN HCT) 20-25 MG tablet Take 1 tablet by mouth every morning. 90 tablet 3  . cholecalciferol (VITAMIN D) 1000 UNITS  tablet Take 2,000 Units by mouth daily.    . Multiple Vitamin (MULTIVITAMIN) tablet Take 1 tablet by mouth daily.    . nitroGLYCERIN (NITROSTAT) 0.4 MG SL tablet Place 0.4 mg under the tongue every 5 (five) minutes as needed for chest pain (3 doses MAX).    Marland Kitchen rosuvastatin (CRESTOR) 20 MG tablet TAKE 1 TABLET (20 MG TOTAL) BY MOUTH EVERY OTHER DAY. 90 tablet 1   No current facility-administered medications for this visit.     Allergies: No Known Allergies  Social History: The patient  reports that he quit smoking about 54 years ago. He has never used smokeless tobacco. He reports that he drinks alcohol. He reports that he does not use drugs.   Family History: The patient's family history includes Hypertension in his brother and sister; Stomach cancer in his father.   Review of Systems: Please see the history of present illness.   Otherwise, the review of systems is positive for none.   All other systems are reviewed and negative.   Physical Exam: VS:  BP 110/70   Pulse 76   Ht 5' 10.5" (1.791 m)   Wt 154 lb 6.4 oz (70 kg)   SpO2 97%   BMI 21.84 kg/m  .  BMI Body mass index is 21.84 kg/m.  Wt Readings from Last 3 Encounters:  01/15/16 154 lb 6.4 oz (70 kg)  01/12/16 155 lb (70.3 kg)  09/18/15 155 lb 3.2 oz (70.4 kg)    General: Pleasant. Very talkative. Well developed, well nourished and in no acute distress.   HEENT: Normal.  Neck: Supple, no JVD, carotid bruits, or masses noted.  Cardiac: Regular rate and rhythm. Occasional ectopic. No murmurs, rubs, or gallops. No edema.  Respiratory:  Lungs are clear to auscultation bilaterally with normal work of breathing.  GI: Soft and nontender.  MS: No deformity or atrophy. Gait and ROM intact.  Skin: Warm and dry. Color is normal.  Neuro:  Strength and sensation are intact and no gross focal deficits noted.  Psych: Alert, appropriate and with normal affect.   LABORATORY DATA:  EKG:  EKG is not ordered today.  Lab Results    Component Value Date   WBC 6.6 01/12/2016   HGB 17.4 (H) 01/12/2016   HCT 50.1 01/12/2016   PLT 258 01/12/2016   GLUCOSE 83 01/12/2016   CHOL 137 11/30/2009   TRIG 122.0 11/30/2009   HDL 58.20 11/30/2009   LDLCALC 54 11/30/2009   ALT 22 08/31/2014   AST 23 08/31/2014   NA 139 01/12/2016   K 3.7 01/12/2016   CL 103 01/12/2016   CREATININE 0.87 01/12/2016   BUN 14 01/12/2016   CO2 28 01/12/2016   TSH 1.37  02/28/2007   PSA 0.66 11/30/2009   INR 1.02 02/19/2014   HGBA1C  12/07/2007    5.5 (NOTE)   The ADA recommends the following therapeutic goal for glycemic   control related to Hgb A1C measurement:   Goal of Therapy:   < 7.0% Hgb A1C   Reference: American Diabetes Association: Clinical Practice   Recommendations 2008, Diabetes Care,  2008, 31:(Suppl 1).    BNP (last 3 results) No results for input(s): BNP in the last 8760 hours.  ProBNP (last 3 results) No results for input(s): PROBNP in the last 8760 hours.   Other Studies Reviewed Today:  Echo 09/08/14 - EF 55%to 60%. Wall motion was normal; Grade 1 diastolicdysfunction. - Aortic valve: Moderately calcified annulus. Trileaflet. Moderatethickening and calcification, consistent with sclerosis. - Mitral valve: There was mild regurgitation. - Pulmonic valve: There was trivial regurgitation.  LHC 11/2007 LM: Normal LAD: Proximal to mid 40-50%, ostial D1 40-50% LCx: Normal RCA: Distal 90% EF 60% PCI: DES to the RCA    Assessment/Plan:  Dizziness:  No longer on his beta blocker. Most recent symptoms felt to be exacerbated by his GI illness - this has now resolved. No indication for further work up at this time.   Coronary artery disease involving native coronary artery of native heart without angina pectoris:  No angina.  Continue ASA, ACE inhibitor, statin.   Benign essential HTN:  BP ok on current regimen.   Continue to monitor.  Continue Benazepril/HCTZ.  Hyperlipidemia:  Continue statin  Current  medicines are reviewed with the patient today.  The patient does not have concerns regarding medicines other than what has been noted above.  The following changes have been made:  See above.  Labs/ tests ordered today include:   No orders of the defined types were placed in this encounter.    Disposition:   FU with Dr. Johnsie Cancel as planned next May.    Patient is agreeable to this plan and will call if any problems develop in the interim.   Signed: Burtis Junes, [name] [name], [name] [name] 01/15/2016 8:38 AM  [facility] [address] [place], [place]  [address] Phone: [phone] Fax: [phone]

## 2016-02-13 DIAGNOSIS — Z23 Encounter for immunization: Secondary | ICD-10-CM | POA: Diagnosis not present

## 2016-03-10 DIAGNOSIS — M66821 Spontaneous rupture of other tendons, right upper arm: Secondary | ICD-10-CM | POA: Diagnosis not present

## 2016-03-14 DIAGNOSIS — S46911A Strain of unspecified muscle, fascia and tendon at shoulder and upper arm level, right arm, initial encounter: Secondary | ICD-10-CM | POA: Diagnosis not present

## 2016-03-15 DIAGNOSIS — H02839 Dermatochalasis of unspecified eye, unspecified eyelid: Secondary | ICD-10-CM | POA: Diagnosis not present

## 2016-03-15 DIAGNOSIS — Z961 Presence of intraocular lens: Secondary | ICD-10-CM | POA: Diagnosis not present

## 2016-03-15 DIAGNOSIS — H1851 Endothelial corneal dystrophy: Secondary | ICD-10-CM | POA: Diagnosis not present

## 2016-03-21 DIAGNOSIS — S46811D Strain of other muscles, fascia and tendons at shoulder and upper arm level, right arm, subsequent encounter: Secondary | ICD-10-CM | POA: Diagnosis not present

## 2016-03-28 DIAGNOSIS — S46011D Strain of muscle(s) and tendon(s) of the rotator cuff of right shoulder, subsequent encounter: Secondary | ICD-10-CM | POA: Diagnosis not present

## 2016-03-28 DIAGNOSIS — S46811D Strain of other muscles, fascia and tendons at shoulder and upper arm level, right arm, subsequent encounter: Secondary | ICD-10-CM | POA: Diagnosis not present

## 2016-04-29 DIAGNOSIS — S46001A Unspecified injury of muscle(s) and tendon(s) of the rotator cuff of right shoulder, initial encounter: Secondary | ICD-10-CM | POA: Diagnosis not present

## 2016-04-29 DIAGNOSIS — M7551 Bursitis of right shoulder: Secondary | ICD-10-CM | POA: Diagnosis not present

## 2016-04-29 DIAGNOSIS — G8918 Other acute postprocedural pain: Secondary | ICD-10-CM | POA: Diagnosis not present

## 2016-04-29 DIAGNOSIS — S46011D Strain of muscle(s) and tendon(s) of the rotator cuff of right shoulder, subsequent encounter: Secondary | ICD-10-CM | POA: Diagnosis not present

## 2016-04-29 DIAGNOSIS — S46011A Strain of muscle(s) and tendon(s) of the rotator cuff of right shoulder, initial encounter: Secondary | ICD-10-CM | POA: Diagnosis not present

## 2016-04-29 DIAGNOSIS — M659 Synovitis and tenosynovitis, unspecified: Secondary | ICD-10-CM | POA: Diagnosis not present

## 2016-04-29 DIAGNOSIS — W19XXXA Unspecified fall, initial encounter: Secondary | ICD-10-CM | POA: Diagnosis not present

## 2016-04-29 DIAGNOSIS — M7521 Bicipital tendinitis, right shoulder: Secondary | ICD-10-CM | POA: Diagnosis not present

## 2016-05-03 ENCOUNTER — Telehealth: Payer: Self-pay | Admitting: Family Medicine

## 2016-05-03 ENCOUNTER — Ambulatory Visit (INDEPENDENT_AMBULATORY_CARE_PROVIDER_SITE_OTHER): Payer: Self-pay | Admitting: Family

## 2016-05-03 VITALS — BP 110/74 | HR 93 | Temp 98.7°F | Wt 152.0 lb

## 2016-05-03 DIAGNOSIS — R197 Diarrhea, unspecified: Secondary | ICD-10-CM

## 2016-05-03 NOTE — Telephone Encounter (Signed)
Patient Name: NAEEM BONNAR DOB: June 25, 1935 Initial Comment Caller states her husband had shoulder surgery on Friday - Started complaining of abdominal pain and bad gas - could not use the bathroom. Took a dose of miralax and was ok but now developed diarrhea. Has gone at least five times today. Nurse Assessment Nurse: Adella Nissen, RN, Leitha Schuller Date/Time ([place] Time): 05/03/2016 3:44:28 PM Confirm and document reason for call. If symptomatic, describe symptoms. ---Caller states shoulder surgery, doing very well with that, little pain, late Saturday afternoon terrible abdominal pain and bad gas pains, took Miralax and the abdominal pain passed and felt much better, now today diarrhea, 99 fever Does the patient have any new or worsening symptoms? ---Yes Will a triage be completed? ---Yes Related visit to physician within the last 2 weeks? ---Yes Does the PT have any chronic conditions? (i.e. diabetes, asthma, etc.) ---Yes List chronic conditions. ---hypertension, cholesterol high, stent several years ago, no problems with heart at this time Is this a behavioral health or substance abuse call? ---No Guidelines Guideline Title Affirmed Question Affirmed Notes Diarrhea [1] SEVERE diarrhea (e.g., 7 or more times / day more than normal) AND [2] age > 60 years Final Disposition User See Physician within 4 Hours (or PCP triage) Adella Nissen, RN, Leitha Schuller Comments Caller informed that if appointment for telehealth not available by 8PM must go to urgent care. Referrals REFERRED TO PCP OFFICE Disagree/Comply: Comply

## 2016-05-03 NOTE — Progress Notes (Signed)
Subjective:     Patient ID: [name] [name], male   DOB: 1935-11-13, 80 y.o.   MRN: [id number]  HPI 80 year old male, 4 days post-op right shoulder surgery. Reports developing loose stools yesterday that has progressed into diarrhea today. 8 stool at present. Has been increasing his fluid intake and has taken 3 imodium. Currently feeling better and has not had diarrhea in the last 2 hours. No abdominal pain or rectal bleeding. No nausea or vomiting. No lightheadedness or dizziness. Denies any recent antibiotic usage.   Review of Systems  Constitutional: Negative.  Negative for fatigue and fever.  HENT: Negative.   Respiratory: Negative.  Negative for shortness of breath.   Cardiovascular: Negative.  Negative for chest pain.  Gastrointestinal: Positive for diarrhea. Negative for abdominal pain, blood in stool, nausea and vomiting.  Endocrine: Negative.   Genitourinary: Negative.   Musculoskeletal: Negative.   Skin: Negative.   Allergic/Immunologic: Negative.   Neurological: Negative.   Hematological: Negative.    Past Medical History:  Diagnosis Date  . Abscess of groin, left   . Arthritis    R hip & knees  . Cancer ([place])    basal cell, history of   . Closed fracture of right foot    a. 08/2014 Fx of R fifth metatarsal (Jones Fx).  . Coronary artery disease    a. 11/2007 Cath/PCI: LM nl, LAD 40-44m, d1 40-50ost, LCX nl, RCA 90d (Platinum Study DES - promus), EF 60%.  . Gastroesophageal reflux disease with hiatal hernia   . H/O hiatal hernia   . Hx MRSA infection   . Hypertension   . Pre-syncope    a. 08/2014 w/ sinus bradycardia in 40's in ED.    Social History   Social History  . Marital status: Married    Spouse name: N/A  . Number of children: N/A  . Years of education: N/A   Occupational History  . Not on file.   Social History Main Topics  . Smoking status: Former Smoker    Quit date: 09/28/1961  . Smokeless tobacco: Never Used  . Alcohol use 0.0 oz/week   Comment: occ  . Drug use: No  . Sexual activity: Not on file   Other Topics Concern  . Not on file   Social History Narrative  . No narrative on file    Past Surgical History:  Procedure Laterality Date  . APPENDECTOMY    . CARDIAC CATHETERIZATION  12/06/07   60%  . COLONOSCOPY    . CORONARY ANGIOPLASTY WITH STENT PLACEMENT    . HERNIA REPAIR    . KNEE ARTHROSCOPY Right 10/03/2012   Procedure: RIGHT KNEEE ARTHROSCOPY WITH DEBRIDEMENT ;  Surgeon: Gearlean Alf, MD;  Location: [facility];  Service: Orthopedics;  Laterality: Right;  . TOTAL HIP ARTHROPLASTY Right 04/05/2013   Procedure: RIGHT TOTAL HIP ARTHROPLASTY ANTERIOR APPROACH;  Surgeon: Gearlean Alf, MD;  Location: [facility];  Service: Orthopedics;  Laterality: Right;  . YAG LASER APPLICATION     for glaucoma - both eyes, no drops as of yet     Family History  Problem Relation Age of Onset  . Hypertension Sister   . Hypertension Brother   . Stomach cancer Father     family hx  . Hypertension      family hx  . Heart disease      family hx  . Heart attack Neg Hx   . Stroke Neg Hx     No Known  Allergies  Current Outpatient Prescriptions on File Prior to Visit  Medication Sig Dispense Refill  . aspirin 325 MG tablet Take 325 mg by mouth every morning.     . benazepril-hydrochlorthiazide (LOTENSIN HCT) 20-25 MG tablet Take 1 tablet by mouth every morning. (Patient not taking: Reported on 05/03/2016) 90 tablet 3  . cholecalciferol (VITAMIN D) 1000 UNITS tablet Take 2,000 Units by mouth daily.    . Multiple Vitamin (MULTIVITAMIN) tablet Take 1 tablet by mouth daily.    . nitroGLYCERIN (NITROSTAT) 0.4 MG SL tablet Place 0.4 mg under the tongue every 5 (five) minutes as needed for chest pain (3 doses MAX).    Marland Kitchen rosuvastatin (CRESTOR) 20 MG tablet TAKE 1 TABLET (20 MG TOTAL) BY MOUTH EVERY OTHER DAY. (Patient not taking: Reported on 05/03/2016) 90 tablet 1   No current facility-administered medications on file prior to visit.      BP 110/74   Pulse 93   Temp 98.7 F (37.1 C) (Oral)   Wt 152 lb (68.9 kg)   SpO2 97%   BMI 21.50 kg/m chart    Objective:   Physical Exam  Constitutional: He is oriented to person, place, and time. He appears well-developed and well-nourished.  HENT:  Mouth/Throat: Oropharynx is clear and moist.  Eyes: Conjunctivae are normal. Pupils are equal, round, and reactive to light.  Neck: Normal range of motion. Neck supple.  Cardiovascular: Normal rate, regular rhythm and normal heart sounds.   Pulmonary/Chest: Effort normal and breath sounds normal.  Abdominal: Soft. Bowel sounds are normal. He exhibits no distension. There is no tenderness. There is no rebound and no guarding.  Musculoskeletal: Normal range of motion.  Right shoulder in sling  Neurological: He is alert and oriented to person, place, and time.  Skin: Skin is warm and dry.  Psychiatric: He has a normal mood and affect.       Assessment:     1. Diarrhea    Plan:     1. Diarrhea-improving with Imodium. Continue as every 6 hours as needed. VS stable. Take NSAID as needed WITH food for pain. Do not take on an empty stomach. Bland diet, advance as tolerated. Watch for s/s of dehydration. To the ED if symptoms worsen. See surgeon as scheduled.

## 2016-05-03 NOTE — Patient Instructions (Signed)
Diarrhea, Adult °Introduction °Diarrhea is when you have loose and water poop (stool) often. Diarrhea can make you feel weak and cause you to get dehydrated. Dehydration can make you tired and thirsty, make you have a dry mouth, and make it so you pee (urinate) less often. Diarrhea often lasts 2-3 days. However, it can last longer if it is a sign of something more serious. It is important to treat your diarrhea as told by your doctor. °Follow these instructions at home: °Eating and drinking °Follow these recommendations as told by your doctor: °· Take an oral rehydration solution (ORS). This is a drink that is sold at pharmacies and stores. °· Drink clear fluids, such as: °¨ Water. °¨ Ice chips. °¨ Diluted fruit juice. °¨ Low-calorie sports drinks. °· Eat bland, easy-to-digest foods in small amounts as you are able. These foods include: °¨ Bananas. °¨ Applesauce. °¨ Rice. °¨ Low-fat (lean) meats. °¨ Toast. °¨ Crackers. °· Avoid drinking fluids that have a lot of sugar or caffeine in them. °· Avoid alcohol. °· Avoid spicy or fatty foods. °General instructions °· Drink enough fluid to keep your pee (urine) clear or pale yellow. °· Wash your hands often. If you cannot use soap and water, use hand sanitizer. °· Make sure that all people in your home wash their hands well and often. °· Take over-the-counter and prescription medicines only as told by your doctor. °· Rest at home while you get better. °· Watch your condition for any changes. °· Take a warm bath to help with any burning or pain from having diarrhea. °· Keep all follow-up visits as told by your doctor. This is important. °Contact a doctor if: °· You have a fever. °· Your diarrhea gets worse. °· You have new symptoms. °· You cannot keep fluids down. °· You feel light-headed or dizzy. °· You have a headache. °· You have muscle cramps. °Get help right away if: °· You have chest pain. °· You feel very weak or you pass out (faint). °· You have bloody or black  poop or poop that look like tar. °· You have very bad pain, cramping, or bloating in your belly (abdomen). °· You have trouble breathing or you are breathing very quickly. °· Your heart is beating very quickly. °· Your skin feels cold and clammy. °· You feel confused. °· You have signs of dehydration, such as: °¨ Dark pee, hardly any pee, or no pee. °¨ Cracked lips. °¨ Dry mouth. °¨ Sunken eyes. °¨ Sleepiness. °¨ Weakness. °This information is not intended to replace advice given to you by your health care provider. Make sure you discuss any questions you have with your health care provider. °Document Released: 10/19/2007 Document Revised: 11/20/2015 Document Reviewed: 01/06/2015 °© 2017 Elsevier ° °

## 2016-05-04 NOTE — Telephone Encounter (Signed)
Was seen 05/03/16 in office

## 2016-05-06 DIAGNOSIS — S46011D Strain of muscle(s) and tendon(s) of the rotator cuff of right shoulder, subsequent encounter: Secondary | ICD-10-CM | POA: Diagnosis not present

## 2016-05-06 DIAGNOSIS — Z4789 Encounter for other orthopedic aftercare: Secondary | ICD-10-CM | POA: Diagnosis not present

## 2016-05-07 ENCOUNTER — Encounter: Payer: Self-pay | Admitting: Nurse Practitioner

## 2016-05-07 ENCOUNTER — Ambulatory Visit (INDEPENDENT_AMBULATORY_CARE_PROVIDER_SITE_OTHER): Payer: Self-pay | Admitting: Nurse Practitioner

## 2016-05-07 DIAGNOSIS — L02214 Cutaneous abscess of groin: Secondary | ICD-10-CM

## 2016-05-07 MED ORDER — BACTRIM DS 800-160 MG PO TABS: 1.0000 | ORAL_TABLET | Freq: Two times a day (BID) | ORAL | 0 refills | Status: DC

## 2016-05-07 NOTE — Progress Notes (Addendum)
   Subjective:    Patient ID: [name] [name], male    DOB: [birth year], 80 y.o.   MRN: [id number]  The patient is a 80 y.o. Male that presents with complaints of a left groin abscess.  The patient states it started today.  Patient denies fever, chills, appetite change, change in activity.  The patient had shoulder surgery on April 29, 2016 and has been doing well with the exception of constipation and diarrhea.  The patient also has a history of a coronary stent which was placed 8 years ago.  The patient states after that procedure, he developed an abscess and developed MRSA at the site.  The patient denies pain at the location of the left groin abscess, drainage or erythema. Patient also denies history of DM.      Review of Systems  Constitutional: Negative.   Respiratory: Negative.   Cardiovascular: Negative.   Skin:       Left groin abscess       Objective:   Physical Exam  Constitutional: He is oriented to person, place, and time. He appears well-developed and well-nourished.  HENT:  Head: Normocephalic and atraumatic.  Eyes: Conjunctivae and EOM are normal. Pupils are equal, round, and reactive to light.  Neck: Normal range of motion. Neck supple.  Cardiovascular: Normal rate and regular rhythm.   Pulmonary/Chest: Effort normal and breath sounds normal.  Abdominal: Soft. Bowel sounds are normal. He exhibits no distension. There is no tenderness.  Neurological: He is alert and oriented to person, place, and time.  Skin: Skin is warm and dry. There is erythema (mild erythema to left groin).  Left groin induration measuring approximately 2cm in diameter, firm.  No drainage, mild erythema. No pain with palpation.   Psychiatric: He has a normal mood and affect. His behavior is normal. Judgment and thought content normal.          Assessment & Plan:  Left groin abscess.  Patient provided instructions for abscess.  Patient to use warm compresses to site 3-4x/day for 10 days.   Patient to use anti-bacterial soap for 10 days.  Patient will follow up with pcp.  Will return if follow up if no improvement.

## 2016-05-07 NOTE — Patient Instructions (Addendum)
Skin Abscess A skin abscess is an infected area on or under your skin that contains a collection of pus and other material. An abscess may also be called a furuncle, carbuncle, or boil. An abscess can occur in or on almost any part of your body. Some abscesses break open (rupture) on their own. Most continue to get worse unless they are treated. The infection can spread deeper into the body and eventually into your blood, which can make you feel ill. Treatment usually involves draining the abscess. What are the causes? An abscess occurs when germs, often bacteria, pass through your skin and cause an infection. This may be caused by:  A scrape or cut on your skin.  A puncture wound through your skin, including a needle injection.  Blocked oil or sweat glands.  Blocked and infected hair follicles.  A cyst that forms beneath your skin (sebaceous cyst) and becomes infected. What increases the risk? This condition is more likely to develop in people who:  Have a weak body defense system (immune system).  Have diabetes.  Have dry and irritated skin.  Get frequent injections or use illegal IV drugs.  Have a foreign body in a wound, such as a splinter.  Have problems with their lymph system or veins. What are the signs or symptoms? An abscess may start as a painful, firm bump under the skin. Over time, the abscess may get larger or become softer. Pus may appear at the top of the abscess, causing pressure and pain. It may eventually break through the skin and drain. Other symptoms include:  Redness.  Warmth.  Swelling.  Tenderness.  A sore on the skin. How is this diagnosed? This condition is diagnosed based on your medical history and a physical exam. A sample of pus may be taken from the abscess to find out what is causing the infection and what antibiotics can be used to treat it. You also may have:  Blood tests to look for signs of infection or spread of an infection to your  blood.  Imaging studies such as ultrasound, CT scan, or MRI if the abscess is deep. How is this treated? Small abscesses that drain on their own may not need treatment. Treatment for an abscess that does not rupture on its own may include:  Warm compresses applied to the area several times per day.  Incision and drainage. Your health care provider will make an incision to open the abscess and will remove pus and any foreign body or dead tissue. The incision area may be packed with gauze to keep it open for a few days while it heals.  Antibiotic medicines to treat infection. For a severe abscess, you may first get antibiotics through an IV and then change to oral antibiotics.  Use dial anti-bacterial soap to site for 10 days. Follow these instructions at home: Abscess Care  If you have an abscess that has not drained, place a warm, clean, wet washcloth over the abscess several times a day. Do this as told by your health care provider.  Follow instructions from your health care provider about how to take care of your abscess. Make sure you:  Cover the abscess with a bandage (dressing).  Change your dressing or gauze as told by your health care provider.  Wash your hands with soap and water before you change the dressing or gauze. If soap and water are not available, use hand sanitizer.  Check your abscess every day for signs of a  worsening infection. Check for:  More redness, swelling, or pain.  More fluid or blood.  Warmth.  More pus or a bad smell. Medicines  Take over-the-counter and prescription medicines only as told by your health care provider.  If you were prescribed an antibiotic medicine, take it as told by your health care provider. Do not stop taking the antibiotic even if you start to feel better. General instructions  To avoid spreading the infection:  Do not share personal care items, towels, or hot tubs with others.  Avoid making skin contact with other  people.  Keep all follow-up visits as told by your health care provider. This is important. Contact a health care provider if:  You have more redness, swelling, or pain around your abscess.  You have more fluid or blood coming from your abscess.  Your abscess feels warm to the touch.  You have more pus or a bad smell coming from your abscess.  You have a fever.  You have muscle aches.  You have chills or a general ill feeling. Get help right away if:  You have severe pain.  You see red streaks on your skin spreading away from the abscess. This information is not intended to replace advice given to you by your health care provider. Make sure you discuss any questions you have with your health care provider. Document Released: 02/09/2005 Document Revised: 12/27/2015 Document Reviewed: 03/11/2015 Elsevier Interactive Patient Education  2017 [employer].

## 2016-05-10 ENCOUNTER — Telehealth: Payer: Self-pay | Admitting: Nurse Practitioner

## 2016-05-10 ENCOUNTER — Encounter: Payer: Self-pay | Admitting: Family Medicine

## 2016-05-10 ENCOUNTER — Ambulatory Visit (INDEPENDENT_AMBULATORY_CARE_PROVIDER_SITE_OTHER): Payer: Medicare Other | Admitting: Family Medicine

## 2016-05-10 VITALS — BP 120/82 | HR 100 | Temp 98.2°F | Ht 70.5 in | Wt 148.0 lb

## 2016-05-10 DIAGNOSIS — R197 Diarrhea, unspecified: Secondary | ICD-10-CM | POA: Diagnosis not present

## 2016-05-10 DIAGNOSIS — I2510 Atherosclerotic heart disease of native coronary artery without angina pectoris: Secondary | ICD-10-CM

## 2016-05-10 DIAGNOSIS — L02214 Cutaneous abscess of groin: Secondary | ICD-10-CM

## 2016-05-10 NOTE — Telephone Encounter (Signed)
Spoke with patient's wife regarding status.  Per wife, patient was seen by his PCP today for the left groin "abscess".  Per patient's wife, the MD felt it was a cyst and attempted to lance the site.  Patient was instructed to stop abx given d/t recurrent diarrhea and because he did not think it was MRSA.  Patient is also going to f/u with PCP tomorrow.  Instructed patient's wife to give patient bland foods and follow BRAT diet.  Patient's wife verbalized understanding.

## 2016-05-10 NOTE — Progress Notes (Signed)
Subjective:     Patient ID: [name] [name], male   DOB: 05/05/1936, 80 y.o.   MRN: [id number]  HPI Patient seen today for acute visit with some mild diarrhea and possible abscess left groin region. Recent history is that he had right shoulder surgery rotator cuff December 15. He only took one pain medication tablet after surgery. He did have some intermittent constipation and now diarrhea. He developed some redness and swelling left groin region and 4 days ago went to urgent care center. He was told use warm compresses and started on Septra DS. He feels his diarrhea has worsened since then. No bloody diarrhea. No nausea or vomiting. No abdominal cramping. Had about 4 loose to watery stools yesterday. Remote history of MRSA about 8 years ago.  Past Medical History:  Diagnosis Date  . Abscess of groin, left   . Arthritis    R hip & knees  . Cancer ([place])    basal cell, history of   . Closed fracture of right foot    a. 08/2014 Fx of R fifth metatarsal (Jones Fx).  . Coronary artery disease    a. 11/2007 Cath/PCI: LM nl, LAD 40-63m, d1 40-50ost, LCX nl, RCA 90d (Platinum Study DES - promus), EF 60%.  . Gastroesophageal reflux disease with hiatal hernia   . H/O hiatal hernia   . Hx MRSA infection   . Hypertension   . Pre-syncope    a. 08/2014 w/ sinus bradycardia in 40's in ED.   Past Surgical History:  Procedure Laterality Date  . APPENDECTOMY    . CARDIAC CATHETERIZATION  12/06/07   60%  . COLONOSCOPY    . CORONARY ANGIOPLASTY WITH STENT PLACEMENT    . HERNIA REPAIR    . KNEE ARTHROSCOPY Right 10/03/2012   Procedure: RIGHT KNEEE ARTHROSCOPY WITH DEBRIDEMENT ;  Surgeon: Gearlean Alf, MD;  Location: [facility];  Service: Orthopedics;  Laterality: Right;  . TOTAL HIP ARTHROPLASTY Right 04/05/2013   Procedure: RIGHT TOTAL HIP ARTHROPLASTY ANTERIOR APPROACH;  Surgeon: Gearlean Alf, MD;  Location: [place];  Service: Orthopedics;  Laterality: Right;  . YAG LASER APPLICATION     for glaucoma -  both eyes, no drops as of yet     reports that he quit smoking about 54 years ago. He has never used smokeless tobacco. He reports that he drinks alcohol. He reports that he does not use drugs. family history includes Hypertension in his brother and sister; Stomach cancer in his father. No Known Allergies   Review of Systems  Constitutional: Negative for chills and fever.  Respiratory: Negative for shortness of breath.   Gastrointestinal: Positive for diarrhea. Negative for abdominal pain, nausea and vomiting.       Objective:   Physical Exam  Constitutional: He appears well-developed and well-nourished.  Cardiovascular: Normal rate and regular rhythm.   Pulmonary/Chest: Effort normal and breath sounds normal. No respiratory distress. He has no wheezes. He has no rales.  Skin:  Right upper inner groin reveals approximately 1 cm somewhat elongated minimally erythematous area fluctuance. Minimally tender to palpation. No surrounding cellulitis changes.       Assessment:     #1 abscess left groin. No some surrounding cellulitis changes  #2 mild diarrhea possibly related to antibiotic. Doubt C. difficile    Plan:     -We discussed risk and benefits of incision and drainage of left groin abscess and patient consented. Prepped skin with Betadine. Anesthesia with 1% plain Xylocaine. Using #11  blade made a small linear incision in the area fluctuance. Minimal purulent strain. We were able to express contents of cyst-probably sebaceous cyst wall. Minimal bleeding. We went ahead and packed wound cavity with quarter inch iodoform gauze. Outer dressing applied -Discontinue Bactrim DS at this time -Follow-up tomorrow for wound recheck  Eulas Post MD [facility]

## 2016-05-10 NOTE — Patient Instructions (Signed)
Stop Bactrim DS at this time Follow up tomorrow for packing removal. Keep wound dry until then.

## 2016-05-10 NOTE — Progress Notes (Signed)
Pre visit review using our clinic review tool, if applicable. No additional management support is needed unless otherwise documented below in the visit note. 

## 2016-05-11 ENCOUNTER — Encounter: Payer: Self-pay | Admitting: Family Medicine

## 2016-05-11 ENCOUNTER — Ambulatory Visit (INDEPENDENT_AMBULATORY_CARE_PROVIDER_SITE_OTHER): Payer: Medicare Other | Admitting: Family Medicine

## 2016-05-11 VITALS — BP 90/50 | HR 103 | Temp 98.1°F | Ht 70.5 in | Wt 146.6 lb

## 2016-05-11 DIAGNOSIS — L02214 Cutaneous abscess of groin: Secondary | ICD-10-CM

## 2016-05-11 DIAGNOSIS — R197 Diarrhea, unspecified: Secondary | ICD-10-CM

## 2016-05-11 NOTE — Patient Instructions (Signed)
Keep wound clean with soap and water Follow up for any surrounding redness or drainage.

## 2016-05-11 NOTE — Progress Notes (Signed)
Subjective:     Patient ID: [name] [name], male   DOB: 11-03-1935, 80 y.o.   MRN: [id number]  HPI Patient seen for abscess follow-up. Left groin abscess with incision and drainage yesterday. We expressed contents of what look like sebaceous cyst sac. Minimal purulence. No surrounding cellulitis changes. His diarrhea symptoms are improved. He had no pain last night. No fevers or chills.  We had him stop Septra DS as he had no cellulitis changes and his diarrhea is better.  No nausea or vomiting.  Past Medical History:  Diagnosis Date  . Abscess of groin, left   . Arthritis    R hip & knees  . Cancer ([place])    basal cell, history of   . Closed fracture of right foot    a. 08/2014 Fx of R fifth metatarsal (Jones Fx).  . Coronary artery disease    a. 11/2007 Cath/PCI: LM nl, LAD 40-66m, d1 40-50ost, LCX nl, RCA 90d (Platinum Study DES - promus), EF 60%.  . Gastroesophageal reflux disease with hiatal hernia   . H/O hiatal hernia   . Hx MRSA infection   . Hypertension   . Pre-syncope    a. 08/2014 w/ sinus bradycardia in 40's in ED.   Past Surgical History:  Procedure Laterality Date  . APPENDECTOMY    . CARDIAC CATHETERIZATION  12/06/07   60%  . COLONOSCOPY    . CORONARY ANGIOPLASTY WITH STENT PLACEMENT    . HERNIA REPAIR    . KNEE ARTHROSCOPY Right 10/03/2012   Procedure: RIGHT KNEEE ARTHROSCOPY WITH DEBRIDEMENT ;  Surgeon: Gearlean Alf, MD;  Location: [facility];  Service: Orthopedics;  Laterality: Right;  . TOTAL HIP ARTHROPLASTY Right 04/05/2013   Procedure: RIGHT TOTAL HIP ARTHROPLASTY ANTERIOR APPROACH;  Surgeon: Gearlean Alf, MD;  Location: [facility];  Service: Orthopedics;  Laterality: Right;  . YAG LASER APPLICATION     for glaucoma - both eyes, no drops as of yet     reports that he quit smoking about 54 years ago. He has never used smokeless tobacco. He reports that he drinks alcohol. He reports that he does not use drugs. family history includes Hypertension in his brother  and sister; Stomach cancer in his father. No Known Allergies   Review of Systems  Constitutional: Negative for chills and fever.  Gastrointestinal: Negative for abdominal pain, diarrhea, nausea and vomiting.       Objective:   Physical Exam  Constitutional: He appears well-developed and well-nourished.  Cardiovascular: Normal rate and regular rhythm.   Pulmonary/Chest: Effort normal and breath sounds normal. No respiratory distress. He has no wheezes. He has no rales.  Skin:  Packing is removed from left groin wound. He has clear appearing cavity with no evidence or any purulence. No surrounding erythema. Nontender       Assessment:     Left groin abscess-very likely abscessed sebaceous cyst improved following incision and drainage  Diarrhea improved off of antibiotic.    Plan:     -No indication for wound repacking at this time -Keep clean daily with soap and water -Follow-up promptly for any signs of secondary infection such as redness or swelling  Eulas Post MD  Primary Care at [facility]

## 2016-05-11 NOTE — Progress Notes (Signed)
Pre visit review using our clinic review tool, if applicable. No additional management support is needed unless otherwise documented below in the visit note. 

## 2016-05-16 HISTORY — PX: SHOULDER SURGERY: SHX246

## 2016-05-20 DIAGNOSIS — R197 Diarrhea, unspecified: Secondary | ICD-10-CM | POA: Diagnosis not present

## 2016-05-20 DIAGNOSIS — R198 Other specified symptoms and signs involving the digestive system and abdomen: Secondary | ICD-10-CM | POA: Diagnosis not present

## 2016-05-20 DIAGNOSIS — K5900 Constipation, unspecified: Secondary | ICD-10-CM | POA: Diagnosis not present

## 2016-05-20 DIAGNOSIS — R110 Nausea: Secondary | ICD-10-CM | POA: Diagnosis not present

## 2016-06-06 DIAGNOSIS — R197 Diarrhea, unspecified: Secondary | ICD-10-CM | POA: Diagnosis not present

## 2016-06-07 DIAGNOSIS — R197 Diarrhea, unspecified: Secondary | ICD-10-CM | POA: Diagnosis not present

## 2016-06-08 DIAGNOSIS — Z4789 Encounter for other orthopedic aftercare: Secondary | ICD-10-CM | POA: Diagnosis not present

## 2016-06-08 DIAGNOSIS — S46011D Strain of muscle(s) and tendon(s) of the rotator cuff of right shoulder, subsequent encounter: Secondary | ICD-10-CM | POA: Diagnosis not present

## 2016-06-09 ENCOUNTER — Other Ambulatory Visit: Payer: Self-pay | Admitting: Cardiovascular Disease

## 2016-06-09 NOTE — Telephone Encounter (Signed)
Rx refill sent to pharmacy. 

## 2016-06-13 DIAGNOSIS — R198 Other specified symptoms and signs involving the digestive system and abdomen: Secondary | ICD-10-CM | POA: Diagnosis not present

## 2016-06-13 DIAGNOSIS — R634 Abnormal weight loss: Secondary | ICD-10-CM | POA: Diagnosis not present

## 2016-06-15 DIAGNOSIS — M25611 Stiffness of right shoulder, not elsewhere classified: Secondary | ICD-10-CM | POA: Diagnosis not present

## 2016-06-22 DIAGNOSIS — M25611 Stiffness of right shoulder, not elsewhere classified: Secondary | ICD-10-CM | POA: Diagnosis not present

## 2016-06-24 DIAGNOSIS — M25611 Stiffness of right shoulder, not elsewhere classified: Secondary | ICD-10-CM | POA: Diagnosis not present

## 2016-06-29 DIAGNOSIS — M25611 Stiffness of right shoulder, not elsewhere classified: Secondary | ICD-10-CM | POA: Diagnosis not present

## 2016-07-01 DIAGNOSIS — M25611 Stiffness of right shoulder, not elsewhere classified: Secondary | ICD-10-CM | POA: Diagnosis not present

## 2016-07-06 DIAGNOSIS — M25611 Stiffness of right shoulder, not elsewhere classified: Secondary | ICD-10-CM | POA: Diagnosis not present

## 2016-07-06 DIAGNOSIS — Z4789 Encounter for other orthopedic aftercare: Secondary | ICD-10-CM | POA: Diagnosis not present

## 2016-07-06 DIAGNOSIS — S46011D Strain of muscle(s) and tendon(s) of the rotator cuff of right shoulder, subsequent encounter: Secondary | ICD-10-CM | POA: Diagnosis not present

## 2016-07-08 DIAGNOSIS — M25611 Stiffness of right shoulder, not elsewhere classified: Secondary | ICD-10-CM | POA: Diagnosis not present

## 2016-07-13 DIAGNOSIS — M25611 Stiffness of right shoulder, not elsewhere classified: Secondary | ICD-10-CM | POA: Diagnosis not present

## 2016-07-15 DIAGNOSIS — M25611 Stiffness of right shoulder, not elsewhere classified: Secondary | ICD-10-CM | POA: Diagnosis not present

## 2016-07-20 DIAGNOSIS — M25611 Stiffness of right shoulder, not elsewhere classified: Secondary | ICD-10-CM | POA: Diagnosis not present

## 2016-07-22 DIAGNOSIS — M25611 Stiffness of right shoulder, not elsewhere classified: Secondary | ICD-10-CM | POA: Diagnosis not present

## 2016-07-27 DIAGNOSIS — M25611 Stiffness of right shoulder, not elsewhere classified: Secondary | ICD-10-CM | POA: Diagnosis not present

## 2016-07-29 DIAGNOSIS — M25611 Stiffness of right shoulder, not elsewhere classified: Secondary | ICD-10-CM | POA: Diagnosis not present

## 2016-08-03 DIAGNOSIS — Z4789 Encounter for other orthopedic aftercare: Secondary | ICD-10-CM | POA: Diagnosis not present

## 2016-08-03 DIAGNOSIS — S46011D Strain of muscle(s) and tendon(s) of the rotator cuff of right shoulder, subsequent encounter: Secondary | ICD-10-CM | POA: Diagnosis not present

## 2016-08-22 ENCOUNTER — Other Ambulatory Visit: Payer: Self-pay | Admitting: Cardiovascular Disease

## 2016-08-26 ENCOUNTER — Other Ambulatory Visit: Payer: Self-pay | Admitting: Cardiovascular Disease

## 2016-08-30 ENCOUNTER — Encounter: Payer: Self-pay | Admitting: Cardiovascular Disease

## 2016-09-12 DIAGNOSIS — C44112 Basal cell carcinoma of skin of right eyelid, including canthus: Secondary | ICD-10-CM | POA: Diagnosis not present

## 2016-09-12 DIAGNOSIS — D485 Neoplasm of uncertain behavior of skin: Secondary | ICD-10-CM | POA: Diagnosis not present

## 2016-09-14 NOTE — Progress Notes (Signed)
CARDIOLOGY OFFICE NOTE  Date:  09/16/2016    [name] [name] Date of Birth: 1935/10/26 Medical Record #[id number]  PCP:  Alysia Penna, MD  Cardiologist:  Johnsie Cancel  Chief Complaint  Patient presents with  . Coronary Artery Disease    History of Present Illness: [name] [name] is a 81 y.o. male  f/u CAD. Last seen in ER 01/12/16 for dizziness and palpitations by Dr Percival Spanish  With negative w/u thought to be due to GI illness   He has a hx of CAD status post DES to the RCA in 2009, HTN, & HLD  Dizziness 2016  Echocardiogram 09/08/14 reviewed  This demonstrated normal LV function without significant valvular abnormalities. Six-month follow-up was recommended. However, he went back to the emergency room with another episode of dizziness. Heart rate was in the 40s and his carvedilol was discontinued. Event monitor was arranged. Apparently, according to the notes in the chart, this demonstrated NSR, PACs and no significant arrhythmia.  Had right shoulder surgery Dr Onnie Graham with constipation and diarrhea post op Prolonged rehab  Basal cell noted on right neck where scopolamine patch was needs excision   Back to golfing   Past Medical History:  Diagnosis Date  . Abscess of groin, left   . Arthritis    R hip & knees  . Cancer ([place])    basal cell, history of   . Closed fracture of right foot    a. 08/2014 Fx of R fifth metatarsal (Jones Fx).  . Coronary artery disease    a. 11/2007 Cath/PCI: LM nl, LAD 40-69m, d1 40-50ost, LCX nl, RCA 90d (Platinum Study DES - promus), EF 60%.  . Gastroesophageal reflux disease with hiatal hernia   . H/O hiatal hernia   . Hx MRSA infection   . Hypertension   . Pre-syncope    a. 08/2014 w/ sinus bradycardia in 40's in ED.    Past Surgical History:  Procedure Laterality Date  . APPENDECTOMY    . CARDIAC CATHETERIZATION  12/06/07   60%  . COLONOSCOPY    . CORONARY ANGIOPLASTY WITH STENT PLACEMENT    . HERNIA REPAIR    . KNEE ARTHROSCOPY Right  10/03/2012   Procedure: RIGHT KNEEE ARTHROSCOPY WITH DEBRIDEMENT ;  Surgeon: Gearlean Alf, MD;  [facility];  Service: Orthopedics;  Laterality: Right;  . TOTAL HIP ARTHROPLASTY Right 04/05/2013   Procedure: RIGHT TOTAL HIP ARTHROPLASTY ANTERIOR APPROACH;  Surgeon: Gearlean Alf, MD;  Location: [place];  Service: Orthopedics;  Laterality: Right;  . YAG LASER APPLICATION     for glaucoma - both eyes, no drops as of yet      Medications: Current Outpatient Prescriptions  Medication Sig Dispense Refill  . aspirin 325 MG tablet Take 325 mg by mouth every morning.     . benazepril-hydrochlorthiazide (LOTENSIN HCT) 20-25 MG tablet TAKE 1 TABLET BY MOUTH EVERY MORNING. 90 tablet 1  . cholecalciferol (VITAMIN D) 1000 UNITS tablet Take 2,000 Units by mouth daily.    . Multiple Vitamin (MULTIVITAMIN) tablet Take 1 tablet by mouth daily.    . nitroGLYCERIN (NITROSTAT) 0.4 MG SL tablet Place 0.4 mg under the tongue every 5 (five) minutes as needed for chest pain (3 doses MAX).    Marland Kitchen rosuvastatin (CRESTOR) 20 MG tablet TAKE 1 TABLET (20 MG TOTAL) BY MOUTH EVERY OTHER DAY. 90 tablet 1   No current facility-administered medications for this visit.     Allergies: No Known Allergies  Social  History: The patient  reports that he quit smoking about 55 years ago. He has never used smokeless tobacco. He reports that he drinks alcohol. He reports that he does not use drugs.   Family History: The patient's family history includes Hypertension in his brother and sister; Stomach cancer in his father.   Review of Systems: Please see the history of present illness.   Otherwise, the review of systems is positive for none.   All other systems are reviewed and negative.   Physical Exam: VS:  BP 140/70   Pulse 64   Ht 5\' 10"  (1.778 m)   Wt 154 lb 6.4 oz (70 kg)   SpO2 98%   BMI 22.15 kg/m  .  BMI Body mass index is 22.15 kg/m.  Wt Readings from Last 3 Encounters:  09/16/16 154 lb 6.4 oz (70 kg)    05/11/16 146 lb 9.6 oz (66.5 kg)  05/10/16 148 lb (67.1 kg)    Affect appropriate Healthy:  appears stated age HEENT: normal Neck supple with no adenopathy JVP normal  Right  bruits no thyromegaly Lungs clear with no wheezing and good diaphragmatic motion Heart:  S1/S2 SEM 3/6 murmur, no rub, gallop or click PMI normal Abdomen: benighn, BS positve, no tenderness, no AAA no bruit.  No HSM or HJR Distal pulses intact with no bruits No edema Neuro non-focal Skin warm and dry No muscular weakness    LABORATORY DATA:  EKG:   09/16/16  SR wendebach 3:2   Lab Results  Component Value Date   WBC 6.6 01/12/2016   HGB 17.4 (H) 01/12/2016   HCT 50.1 01/12/2016   PLT 258 01/12/2016   GLUCOSE 83 01/12/2016   CHOL 137 11/30/2009   TRIG 122.0 11/30/2009   HDL 58.20 11/30/2009   LDLCALC 54 11/30/2009   ALT 22 08/31/2014   AST 23 08/31/2014   NA 139 01/12/2016   K 3.7 01/12/2016   CL 103 01/12/2016   CREATININE 0.87 01/12/2016   BUN 14 01/12/2016   CO2 28 01/12/2016   TSH 1.37 02/28/2007   PSA 0.66 11/30/2009   INR 1.02 02/19/2014   HGBA1C  12/07/2007    5.5 (NOTE)   The ADA recommends the following therapeutic goal for glycemic   control related to Hgb A1C measurement:   Goal of Therapy:   < 7.0% Hgb A1C   Reference: American Diabetes Association: Clinical Practice   Recommendations 2008, Diabetes Care,  2008, 31:(Suppl 1).    BNP (last 3 results) No results for input(s): BNP in the last 8760 hours.  ProBNP (last 3 results) No results for input(s): PROBNP in the last 8760 hours.   Other Studies Reviewed Today:  Echo 09/08/14 - EF 55%to 60%. Wall motion was normal; Grade 1 diastolicdysfunction. - Aortic valve: Moderately calcified annulus. Trileaflet. Moderatethickening and calcification, consistent with sclerosis. - Mitral valve: There was mild regurgitation. - Pulmonic valve: There was trivial regurgitation.  LHC 11/2007 LM: Normal LAD: Proximal to mid  40-50%, ostial D1 40-50% LCx: Normal RCA: Distal 90% EF 60% PCI: DES to the RCA    Assessment/Plan:  Coronary artery disease involving native coronary artery of native heart without angina pectoris:  No angina.  Continue ASA, ACE inhibitor, statin.   Benign essential HTN:  BP ok on current regimen.   Continue to monitor.  Continue Benazepril/HCTZ.  Hyperlipidemia:  Continue statin  Bradycardia:  History of AV block and wenckebach beta blocker stopped f/u exercise myovue make sure no high Grade block  or ischemia given history of CAD No indication for pacer evaluation   Murmur:  Louder on exam ? MR and AV sclerosis f/u echo   Bruit:  Right side ASA f/u duplex new finding   Derm:  Basal cell excision next month has had lesions removed in past   Current medicines are reviewed with the patient today.  The patient does not have concerns regarding medicines other than what has been noted above.  The following changes have been made:  See above.  Labs/ tests ordered today include:    Orders Placed This Encounter  Procedures  . Myocardial Perfusion Imaging  . EKG 12-Lead  . ECHOCARDIOGRAM COMPLETE     Disposition:   f/u in a year    Jenkins Rouge

## 2016-09-16 ENCOUNTER — Ambulatory Visit (INDEPENDENT_AMBULATORY_CARE_PROVIDER_SITE_OTHER): Payer: Medicare Other | Admitting: Cardiovascular Disease

## 2016-09-16 ENCOUNTER — Encounter: Payer: Self-pay | Admitting: Cardiovascular Disease

## 2016-09-16 VITALS — BP 140/70 | HR 64 | Ht 70.0 in | Wt 154.4 lb

## 2016-09-16 DIAGNOSIS — I459 Conduction disorder, unspecified: Secondary | ICD-10-CM

## 2016-09-16 DIAGNOSIS — R079 Chest pain, unspecified: Secondary | ICD-10-CM | POA: Diagnosis not present

## 2016-09-16 DIAGNOSIS — I2510 Atherosclerotic heart disease of native coronary artery without angina pectoris: Secondary | ICD-10-CM

## 2016-09-16 DIAGNOSIS — R0989 Other specified symptoms and signs involving the circulatory and respiratory systems: Secondary | ICD-10-CM

## 2016-09-16 DIAGNOSIS — R011 Cardiac murmur, unspecified: Secondary | ICD-10-CM

## 2016-09-16 NOTE — Patient Instructions (Addendum)
Medication Instructions:  Your physician recommends that you continue on your current medications as directed. Please refer to the Current Medication list given to you today.  Labwork: NONE  Testing/Procedures: Your physician has requested that you have an echocardiogram. Echocardiography is a painless test that uses sound waves to create images of your heart. It provides your doctor with information about the size and shape of your heart and how well your heart's chambers and valves are working. This procedure takes approximately one hour. There are no restrictions for this procedure.  Your physician has requested that you have en exercise stress myoview. For further information please visit [email or web address]. Please follow instruction sheet, as given.  Your physician has requested that you have a carotid duplex. This test is an ultrasound of the carotid arteries in your neck. It looks at blood flow through these arteries that supply the brain with blood. Allow one hour for this exam. There are no restrictions or special instructions.  Follow-Up: Your physician wants you to follow-up in: 6 months with Dr. Johnsie Cancel. You will receive a reminder letter in the mail two months in advance. If you don't receive a letter, please call our office to schedule the follow-up appointment.   If you need a refill on your cardiac medications before your next appointment, please call your pharmacy.

## 2016-09-19 ENCOUNTER — Ambulatory Visit (INDEPENDENT_AMBULATORY_CARE_PROVIDER_SITE_OTHER): Payer: Medicare Other | Admitting: Family Medicine

## 2016-09-19 ENCOUNTER — Encounter: Payer: Self-pay | Admitting: Family Medicine

## 2016-09-19 VITALS — BP 140/82 | HR 69 | Temp 98.6°F | Ht 70.0 in | Wt 155.1 lb

## 2016-09-19 DIAGNOSIS — I2510 Atherosclerotic heart disease of native coronary artery without angina pectoris: Secondary | ICD-10-CM | POA: Diagnosis not present

## 2016-09-19 DIAGNOSIS — L0292 Furuncle, unspecified: Secondary | ICD-10-CM | POA: Diagnosis not present

## 2016-09-19 MED ORDER — DOXYCYCLINE HYCLATE 100 MG PO TABS: 100.0000 mg | ORAL_TABLET | Freq: Two times a day (BID) | ORAL | 0 refills | Status: DC

## 2016-09-19 NOTE — Patient Instructions (Signed)
Warm compresses twice daily  Monitor daily  Take the Doxycycline 3-5 days. Stay out of the sun. Ok to take with food if upsets stomach.  I hope you are feeling better soon! Seek care immediately if worsening, new concerns or you are not improving with treatment.

## 2016-09-19 NOTE — Progress Notes (Signed)
Pre visit review using our clinic review tool, if applicable. No additional management support is needed unless otherwise documented below in the visit note. 

## 2016-09-19 NOTE — Progress Notes (Signed)
HPI:  Acute visit for skin boil: -x3 days, improving with compresses -no fevers, malaise, spreading redness -hx of boil in different location in the past per his report -tolerates doxy well  ROS: See pertinent positives and negatives per HPI.  Past Medical History:  Diagnosis Date  . Abscess of groin, left   . Arthritis    R hip & knees  . Cancer ([place])    basal cell, history of   . Closed fracture of right foot    a. 08/2014 Fx of R fifth metatarsal (Jones Fx).  . Coronary artery disease    a. 11/2007 Cath/PCI: LM nl, LAD 40-53m, d1 40-50ost, LCX nl, RCA 90d (Platinum Study DES - promus), EF 60%.  . Gastroesophageal reflux disease with hiatal hernia   . H/O hiatal hernia   . Hx MRSA infection   . Hypertension   . Pre-syncope    a. 08/2014 w/ sinus bradycardia in 40's in ED.    Past Surgical History:  Procedure Laterality Date  . APPENDECTOMY    . CARDIAC CATHETERIZATION  12/06/07   60%  . COLONOSCOPY    . CORONARY ANGIOPLASTY WITH STENT PLACEMENT    . HERNIA REPAIR    . KNEE ARTHROSCOPY Right 10/03/2012   Procedure: RIGHT KNEEE ARTHROSCOPY WITH DEBRIDEMENT ;  Surgeon: Gearlean Alf, MD;  Location: [facility];  Service: Orthopedics;  Laterality: Right;  . TOTAL HIP ARTHROPLASTY Right 04/05/2013   Procedure: RIGHT TOTAL HIP ARTHROPLASTY ANTERIOR APPROACH;  Surgeon: Gearlean Alf, MD;  Location: [facility];  Service: Orthopedics;  Laterality: Right;  . YAG LASER APPLICATION     for glaucoma - both eyes, no drops as of yet     Family History  Problem Relation Age of Onset  . Hypertension Sister   . Hypertension Brother   . Stomach cancer Father     family hx  . Hypertension      family hx  . Heart disease      family hx  . Heart attack Neg Hx   . Stroke Neg Hx     Social History   Social History  . Marital status: Married    Spouse name: N/A  . Number of children: N/A  . Years of education: N/A   Social History Main Topics  . Smoking status: Former Smoker   Quit date: 09/28/1961  . Smokeless tobacco: Never Used  . Alcohol use 0.0 oz/week     Comment: occ  . Drug use: No  . Sexual activity: Not Asked   Other Topics Concern  . None   Social History Narrative  . None     Current Outpatient Prescriptions:  .  aspirin 325 MG tablet, Take 325 mg by mouth every morning. , Disp: , Rfl:  .  benazepril-hydrochlorthiazide (LOTENSIN HCT) 20-25 MG tablet, TAKE 1 TABLET BY MOUTH EVERY MORNING., Disp: 90 tablet, Rfl: 1 .  cholecalciferol (VITAMIN D) 1000 UNITS tablet, Take 2,000 Units by mouth daily., Disp: , Rfl:  .  Multiple Vitamin (MULTIVITAMIN) tablet, Take 1 tablet by mouth daily., Disp: , Rfl:  .  nitroGLYCERIN (NITROSTAT) 0.4 MG SL tablet, Place 0.4 mg under the tongue every 5 (five) minutes as needed for chest pain (3 doses MAX)., Disp: , Rfl:  .  rosuvastatin (CRESTOR) 20 MG tablet, TAKE 1 TABLET (20 MG TOTAL) BY MOUTH EVERY OTHER DAY., Disp: 90 tablet, Rfl: 1 .  doxycycline (VIBRA-TABS) 100 MG tablet, Take 1 tablet (100 mg total) by mouth 2 (  two) times daily., Disp: 10 tablet, Rfl: 0  EXAM:  Vitals:   09/19/16 1353  BP: 140/82  Pulse: 69  Temp: 98.6 F (37 C)    Body mass index is 22.25 kg/m.  GENERAL: vitals reviewed and listed above, alert, oriented, appears well hydrated and in no acute distress  HEENT: atraumatic, conjunttiva clear, no obvious abnormalities on inspection of external nose and ears  NECK: no obvious masses on inspection  SKIN: very small pustule L upper scrotum, drains spontaneously on exam, no surrounding erythema/edema or fluctuance  MS: moves all extremities without noticeable abnormality  PSYCH: pleasant and cooperative, no obvious depression or anxiety  ASSESSMENT AND PLAN:  Discussed the following assessment and plan:  Boil  -we discussed possible serious and likely etiologies, workup and treatment, treatment risks and return precautions -after this discussion, Micahel opted for compresses,  short course doxy after discussion risk -of course, we advised Nathanel  to return or notify a doctor immediately if symptoms worsen or persist or new concerns arise.   Patient Instructions  Warm compresses twice daily  Monitor daily  Take the Doxycycline 3-5 days. Stay out of the sun. Ok to take with food if upsets stomach.  I hope you are feeling better soon! Seek care immediately if worsening, new concerns or you are not improving with treatment.     Colin Benton R., DO

## 2016-09-22 ENCOUNTER — Ambulatory Visit (HOSPITAL_COMMUNITY)
Admission: RE | Admit: 2016-09-22 | Discharge: 2016-09-22 | Disposition: A | Discharge status: Home / Self Care | Payer: Medicare Other | Source: Ambulatory Visit | Attending: Cardiovascular Disease | Admitting: Cardiovascular Disease

## 2016-09-22 DIAGNOSIS — I2510 Atherosclerotic heart disease of native coronary artery without angina pectoris: Secondary | ICD-10-CM | POA: Insufficient documentation

## 2016-09-22 DIAGNOSIS — R0989 Other specified symptoms and signs involving the circulatory and respiratory systems: Secondary | ICD-10-CM | POA: Diagnosis not present

## 2016-09-22 DIAGNOSIS — I6523 Occlusion and stenosis of bilateral carotid arteries: Secondary | ICD-10-CM | POA: Insufficient documentation

## 2016-09-22 DIAGNOSIS — I10 Essential (primary) hypertension: Secondary | ICD-10-CM | POA: Insufficient documentation

## 2016-09-22 DIAGNOSIS — E785 Hyperlipidemia, unspecified: Secondary | ICD-10-CM | POA: Insufficient documentation

## 2016-09-26 ENCOUNTER — Telehealth (HOSPITAL_COMMUNITY): Payer: Self-pay | Admitting: *Deleted

## 2016-09-26 NOTE — Telephone Encounter (Signed)
Patient given detailed instructions per Myocardial Perfusion Study Information Sheet for the test on 09/30/16. Patient notified to arrive 15 minutes early and that it is imperative to arrive on time for appointment to keep from having the test rescheduled.  If you need to cancel or reschedule your appointment, please call the office within 24 hours of your appointment. Failure to do so may result in a cancellation of your appointment, and a $50 no show fee. Patient verbalized understanding.  Hubbard Robinson, RN

## 2016-09-27 ENCOUNTER — Telehealth: Payer: Self-pay | Admitting: Cardiovascular Disease

## 2016-09-27 NOTE — Telephone Encounter (Signed)
Patient returning  Your call,thanks.

## 2016-09-27 NOTE — Telephone Encounter (Signed)
Called patient back about carotid results.

## 2016-09-30 ENCOUNTER — Other Ambulatory Visit: Payer: Self-pay

## 2016-09-30 ENCOUNTER — Ambulatory Visit (HOSPITAL_BASED_OUTPATIENT_CLINIC_OR_DEPARTMENT_OTHER): Payer: Medicare Other

## 2016-09-30 ENCOUNTER — Ambulatory Visit (HOSPITAL_COMMUNITY): Payer: Medicare Other | Attending: Cardiology

## 2016-09-30 DIAGNOSIS — I10 Essential (primary) hypertension: Secondary | ICD-10-CM | POA: Diagnosis not present

## 2016-09-30 DIAGNOSIS — I2510 Atherosclerotic heart disease of native coronary artery without angina pectoris: Secondary | ICD-10-CM | POA: Diagnosis not present

## 2016-09-30 DIAGNOSIS — R9439 Abnormal result of other cardiovascular function study: Secondary | ICD-10-CM | POA: Insufficient documentation

## 2016-09-30 DIAGNOSIS — R011 Cardiac murmur, unspecified: Secondary | ICD-10-CM

## 2016-09-30 DIAGNOSIS — I459 Conduction disorder, unspecified: Secondary | ICD-10-CM | POA: Diagnosis not present

## 2016-09-30 DIAGNOSIS — E785 Hyperlipidemia, unspecified: Secondary | ICD-10-CM | POA: Insufficient documentation

## 2016-09-30 DIAGNOSIS — I348 Other nonrheumatic mitral valve disorders: Secondary | ICD-10-CM | POA: Diagnosis not present

## 2016-09-30 DIAGNOSIS — R079 Chest pain, unspecified: Secondary | ICD-10-CM

## 2016-09-30 DIAGNOSIS — I350 Nonrheumatic aortic (valve) stenosis: Secondary | ICD-10-CM | POA: Insufficient documentation

## 2016-09-30 LAB — MYOCARDIAL PERFUSION IMAGING
CHL CUP NUCLEAR SRS: 2
CHL CUP NUCLEAR SSS: 6
CHL CUP RESTING HR STRESS: 58 {beats}/min
CSEPED: 4 min
CSEPEDS: 0 s
CSEPEW: 4.6 METS
CSEPPHR: 127 {beats}/min
LV dias vol: 125 mL (ref 62–150)
LV sys vol: 55 mL
MPHR: 141 {beats}/min
NUC STRESS TID: 0.94
Percent HR: 90 %
RATE: 0.3
SDS: 4

## 2016-09-30 MED ORDER — TECHNETIUM TC 99M TETROFOSMIN IV KIT
31.6000 | PACK | Freq: Once | INTRAVENOUS | Status: AC | PRN
  Administered 2016-09-30: 31.6 via INTRAVENOUS
  Filled 2016-09-30: qty 32

## 2016-09-30 MED ORDER — TECHNETIUM TC 99M TETROFOSMIN IV KIT
10.7000 | PACK | Freq: Once | INTRAVENOUS | Status: AC | PRN
  Administered 2016-09-30: 10.7 via INTRAVENOUS
  Filled 2016-09-30: qty 11

## 2016-10-04 ENCOUNTER — Telehealth: Payer: Self-pay | Admitting: Cardiovascular Disease

## 2016-10-04 NOTE — Telephone Encounter (Signed)
llow Up:   Pt would like his Echo and Stress test results from 09-30-16 please.Pt said would you please let him know something today please.

## 2016-10-04 NOTE — Telephone Encounter (Signed)
Called patient with results. Patient had no other questions at this time. Informed patient he will get a final review after Dr. Johnsie Cancel reviews test.

## 2016-10-04 NOTE — Telephone Encounter (Signed)
Will see if Cecilie Kicks NP on Dr. Kyla Balzarine care team will review test and note results for patient's echo and stress test.

## 2016-10-04 NOTE — Telephone Encounter (Signed)
Echo with normal pump action of the left ventricle though it is a little stiff, controlling BP is way to treat.  The aortic valve is tight but not severe only mild to moderate.  Will be followed.  Nuc study without lack of blood supply, low risk scan,  Dr. Johnsie Cancel will review later as well.  But looks good.

## 2016-10-15 DIAGNOSIS — L259 Unspecified contact dermatitis, unspecified cause: Secondary | ICD-10-CM | POA: Diagnosis not present

## 2016-11-02 DIAGNOSIS — C4441 Basal cell carcinoma of skin of scalp and neck: Secondary | ICD-10-CM | POA: Diagnosis not present

## 2016-12-16 DIAGNOSIS — L821 Other seborrheic keratosis: Secondary | ICD-10-CM | POA: Diagnosis not present

## 2016-12-16 DIAGNOSIS — D485 Neoplasm of uncertain behavior of skin: Secondary | ICD-10-CM | POA: Diagnosis not present

## 2016-12-16 DIAGNOSIS — C44319 Basal cell carcinoma of skin of other parts of face: Secondary | ICD-10-CM | POA: Diagnosis not present

## 2016-12-16 DIAGNOSIS — Z85828 Personal history of other malignant neoplasm of skin: Secondary | ICD-10-CM | POA: Diagnosis not present

## 2016-12-16 IMAGING — DX DG CHEST 2V
2 series · 2 of 2 positions shown · non-contrast
Comparison: 08/31/2014

CLINICAL DATA: Heart block

EXAM:
CHEST  2 VIEW

[chest pa]
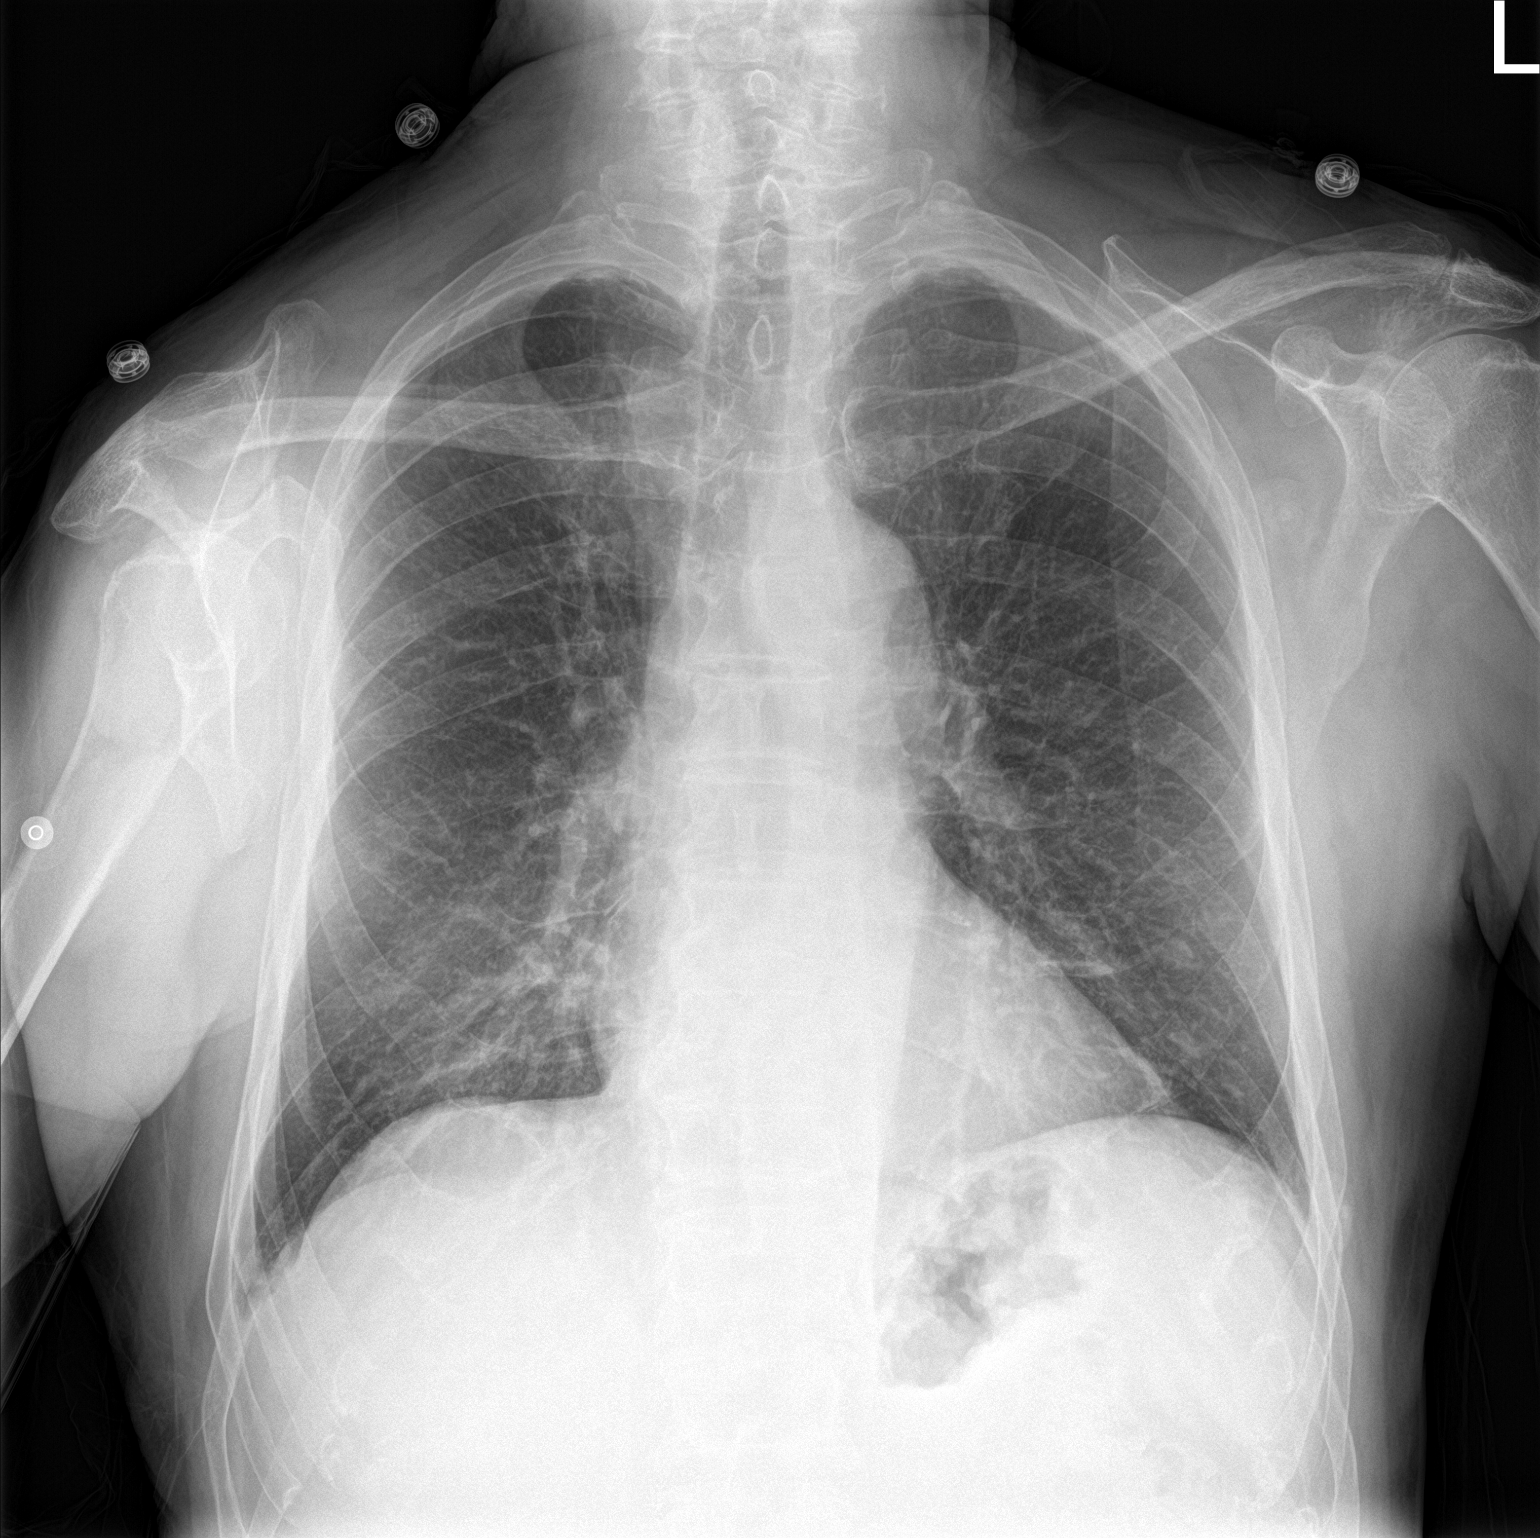

[chest lat]
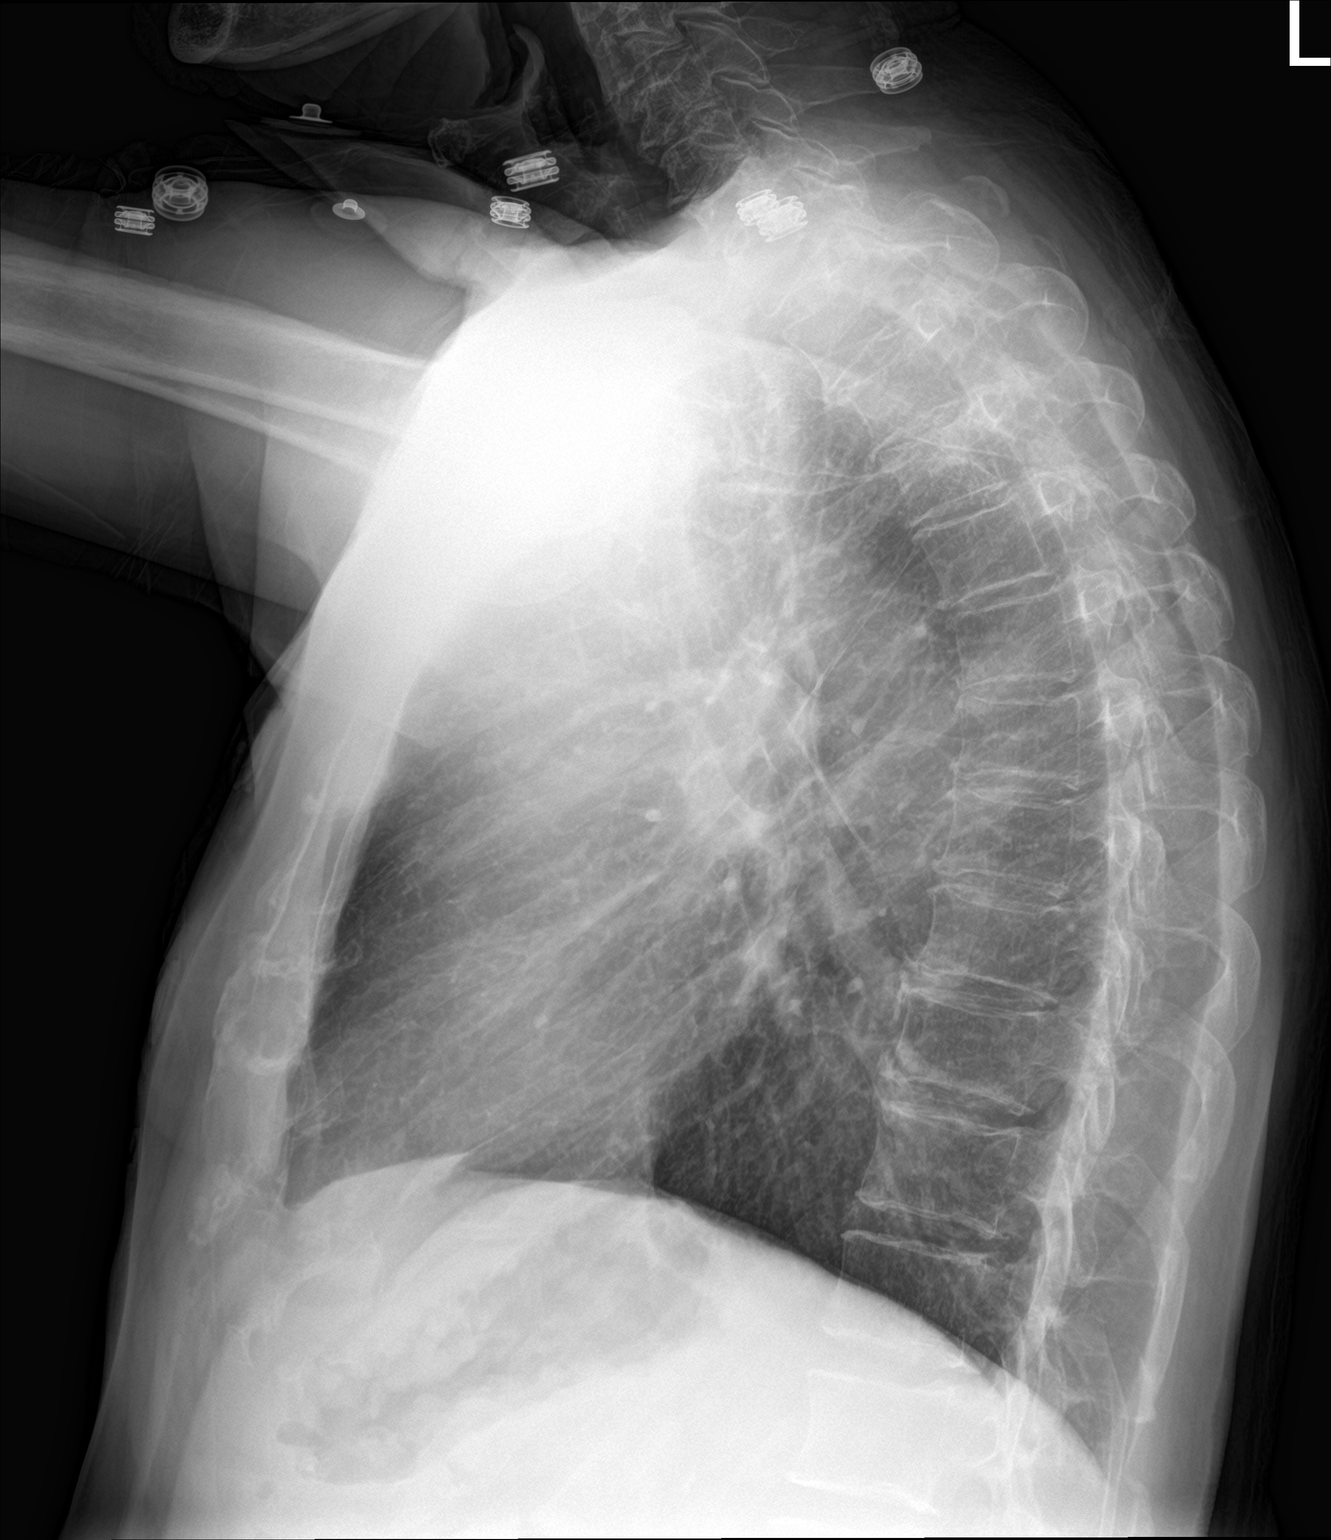

[2 of 2 positions shown; findings below may reference images not displayed]

FINDINGS: Heart size upper normal. Negative for heart failure. Lungs are clear
without infiltrate effusion or mass. No acute skeletal abnormality.
Mild apical scarring bilaterally.
IMPRESSION: No active cardiopulmonary disease.

## 2016-12-30 DIAGNOSIS — S46812A Strain of other muscles, fascia and tendons at shoulder and upper arm level, left arm, initial encounter: Secondary | ICD-10-CM | POA: Diagnosis not present

## 2017-01-20 DIAGNOSIS — C44319 Basal cell carcinoma of skin of other parts of face: Secondary | ICD-10-CM | POA: Diagnosis not present

## 2017-02-03 ENCOUNTER — Ambulatory Visit (INDEPENDENT_AMBULATORY_CARE_PROVIDER_SITE_OTHER): Payer: Medicare Other | Admitting: Emergency Medicine

## 2017-02-03 DIAGNOSIS — Z23 Encounter for immunization: Secondary | ICD-10-CM | POA: Diagnosis not present

## 2017-02-17 ENCOUNTER — Other Ambulatory Visit: Payer: Self-pay | Admitting: Cardiovascular Disease

## 2017-03-14 NOTE — Progress Notes (Signed)
CARDIOLOGY OFFICE NOTE  Date:  03/17/2017    [name] [name] Date of Birth: [birth year] Medical Record #[id number]  PCP:  Laurey Morale, MD  Cardiologist:  Andrez Grime chief complaint on file.   History of Present Illness: [name] [name] is a 81 y.o. male  f/u CAD.     He has a hx of CAD status post DES to the RCA in 2009, HTN, & HLD History of bradycardia on beta blocker associated with dizziness . Event monitor was arranged. Apparently, according to the notes in the chart, this demonstrated NSR, PACs and no significant arrhythmia.  Myovue 09/30/16 old inferior MI no ischemia EF 56% low risk Echo 09/30/16 EF 40-98% grade 2 diastolic mild AS mean gradient 10 mmHg peak 21 mmHg Carotid: 09/22/16 Plaque no stenosis f/u May 2020  Daughter who works at [employer] diagnosed with Hodgkin Lymphoma and getting chemo now Has been rough on family   Past Medical History:  Diagnosis Date  . Abscess of groin, left   . Arthritis    R hip & knees  . Cancer ([place])    basal cell, history of   . Closed fracture of right foot    a. 08/2014 Fx of R fifth metatarsal (Jones Fx).  . Coronary artery disease    a. 11/2007 Cath/PCI: LM nl, LAD 40-39m, d1 40-50ost, LCX nl, RCA 90d (Platinum Study DES - promus), EF 60%.  . Gastroesophageal reflux disease with hiatal hernia   . H/O hiatal hernia   . Hx MRSA infection   . Hypertension   . Pre-syncope    a. 08/2014 w/ sinus bradycardia in 40's in ED.    Past Surgical History:  Procedure Laterality Date  . APPENDECTOMY    . CARDIAC CATHETERIZATION  12/06/07   60%  . COLONOSCOPY    . CORONARY ANGIOPLASTY WITH STENT PLACEMENT    . HERNIA REPAIR    . KNEE ARTHROSCOPY Right 10/03/2012   Procedure: RIGHT KNEEE ARTHROSCOPY WITH DEBRIDEMENT ;  Surgeon: Gearlean Alf, MD;  Location: [facility];  Service: Orthopedics;  Laterality: Right;  . TOTAL HIP ARTHROPLASTY Right 04/05/2013   Procedure: RIGHT TOTAL HIP ARTHROPLASTY ANTERIOR APPROACH;  Surgeon: Gearlean Alf, MD;  Location: [facility];  Service: Orthopedics;  Laterality: Right;  . YAG LASER APPLICATION     for glaucoma - both eyes, no drops as of yet      Medications: Current Outpatient Prescriptions  Medication Sig Dispense Refill  . benazepril-hydrochlorthiazide (LOTENSIN HCT) 20-25 MG tablet TAKE 1 TABLET BY MOUTH EVERY MORNING. 90 tablet 1  . cholecalciferol (VITAMIN D) 1000 UNITS tablet Take 2,000 Units by mouth daily.    Marland Kitchen doxycycline (VIBRAMYCIN) 100 MG capsule Take 100 mg by mouth 2 (two) times daily as needed (for dental appointments).    . Multiple Vitamin (MULTIVITAMIN) tablet Take 1 tablet by mouth daily.    . nitroGLYCERIN (NITROSTAT) 0.4 MG SL tablet Place 0.4 mg under the tongue every 5 (five) minutes as needed for chest pain (3 doses MAX).    Marland Kitchen rosuvastatin (CRESTOR) 20 MG tablet TAKE 1 TABLET (20 MG TOTAL) BY MOUTH EVERY OTHER DAY. 90 tablet 1  . aspirin EC 81 MG tablet Take 1 tablet (81 mg total) by mouth daily.     No current facility-administered medications for this visit.     Allergies: No Known Allergies  Social History: The patient  reports that he quit smoking about 55 years ago. He has  never used smokeless tobacco. He reports that he drinks alcohol. He reports that he does not use drugs.   Family History: The patient's family history includes Heart disease in his unknown relative; Hypertension in his brother, sister, and unknown relative; Stomach cancer in his father.   Review of Systems: Please see the history of present illness.   Otherwise, the review of systems is positive for none.   All other systems are reviewed and negative.   Physical Exam: VS:  BP 126/74   Pulse 61   Ht 5\' 10"  (1.778 m)   Wt 152 lb 12.8 oz (69.3 kg)   SpO2 97%   BMI 21.92 kg/m  .  BMI Body mass index is 21.92 kg/m.  Wt Readings from Last 3 Encounters:  03/17/17 152 lb 12.8 oz (69.3 kg)  09/19/16 155 lb 1.6 oz (70.4 kg)  09/16/16 154 lb 6.4 oz (70 kg)    Affect  appropriate Healthy:  appears stated age HEENT: normal Neck supple with no adenopathy JVP normal right  Bruit vs transmitted AS murmur  no thyromegaly Lungs clear with no wheezing and good diaphragmatic motion Heart:  S1/S2 AS  murmur, no rub, gallop or click PMI normal Abdomen: benighn, BS positve, no tenderness, no AAA no bruit.  No HSM or HJR Distal pulses intact with no bruits No edema Neuro non-focal Skin warm and dry No muscular weakness   LABORATORY DATA:  EKG:   09/16/16  SR wendebach 3:2   Lab Results  Component Value Date   WBC 6.6 01/12/2016   HGB 17.4 (H) 01/12/2016   HCT 50.1 01/12/2016   PLT 258 01/12/2016   GLUCOSE 83 01/12/2016   CHOL 137 11/30/2009   TRIG 122.0 11/30/2009   HDL 58.20 11/30/2009   LDLCALC 54 11/30/2009   ALT 22 08/31/2014   AST 23 08/31/2014   NA 139 01/12/2016   K 3.7 01/12/2016   CL 103 01/12/2016   CREATININE 0.87 01/12/2016   BUN 14 01/12/2016   CO2 28 01/12/2016   TSH 1.37 02/28/2007   PSA 0.66 11/30/2009   INR 1.02 02/19/2014   HGBA1C  12/07/2007    5.5 (NOTE)   The ADA recommends the following therapeutic goal for glycemic   control related to Hgb A1C measurement:   Goal of Therapy:   < 7.0% Hgb A1C   Reference: American Diabetes Association: Clinical Practice   Recommendations 2008, Diabetes Care,  2008, 31:(Suppl 1).    BNP (last 3 results) No results for input(s): BNP in the last 8760 hours.  ProBNP (last 3 results) No results for input(s): PROBNP in the last 8760 hours.   Other Studies Reviewed Today:  Echo 09/08/14 - EF 55%to 60%. Wall motion was normal; Grade 1 diastolicdysfunction. - Aortic valve: Moderately calcified annulus. Trileaflet. Moderatethickening and calcification, consistent with sclerosis. - Mitral valve: There was mild regurgitation. - Pulmonic valve: There was trivial regurgitation.  LHC 11/2007 LM: Normal LAD: Proximal to mid 40-50%, ostial D1 40-50% LCx: Normal RCA: Distal 90% EF  60% PCI: DES to the RCA    Assessment/Plan:  Coronary artery disease involving native coronary artery of native heart without angina pectoris:  No angina.  Continue ASA, ACE inhibitor, statin. Non ischemic myovue May 2018   Benign essential HTN:  BP ok on current regimen.   Continue to monitor.  Continue Benazepril/HCTZ.  Hyperlipidemia:  Continue statin  Bradycardia:  History of AV block and wenckebach beta blocker stopped  No indication for pacer evaluation  Murmur:  Mild AS mean gradient 10 mmHg f/u echo May 2019  Bruit:  Right side ASA  [place] palque no stenosis likely transmitted AS murmur      Disposition:   f/u in a year    [employer]

## 2017-03-17 ENCOUNTER — Encounter (INDEPENDENT_AMBULATORY_CARE_PROVIDER_SITE_OTHER): Payer: Self-pay

## 2017-03-17 ENCOUNTER — Ambulatory Visit (INDEPENDENT_AMBULATORY_CARE_PROVIDER_SITE_OTHER): Payer: Medicare Other | Admitting: Cardiovascular Disease

## 2017-03-17 ENCOUNTER — Encounter: Payer: Self-pay | Admitting: Cardiovascular Disease

## 2017-03-17 VITALS — BP 126/74 | HR 61 | Ht 70.0 in | Wt 152.8 lb

## 2017-03-17 DIAGNOSIS — I350 Nonrheumatic aortic (valve) stenosis: Secondary | ICD-10-CM

## 2017-03-17 DIAGNOSIS — I2510 Atherosclerotic heart disease of native coronary artery without angina pectoris: Secondary | ICD-10-CM | POA: Diagnosis not present

## 2017-03-17 MED ORDER — ASPIRIN EC 81 MG PO TBEC: 81.0000 mg | DELAYED_RELEASE_TABLET | Freq: Every day | ORAL | Status: DC

## 2017-03-17 NOTE — Patient Instructions (Addendum)
Medication Instructions:  Your physician has recommended you make the following change in your medication:  1-Decrease Aspirin 81 mg by mouth daily  Labwork: NONE  Testing/Procedures: Your physician has requested that you have an echocardiogram in 6 months. Echocardiography is a painless test that uses sound waves to create images of your heart. It provides your doctor with information about the size and shape of your heart and how well your heart's chambers and valves are working. This procedure takes approximately one hour. There are no restrictions for this procedure.  Follow-Up: Your physician wants you to follow-up in: 6 months with Dr. Johnsie Cancel. You will receive a reminder letter in the mail two months in advance. If you don't receive a letter, please call our office to schedule the follow-up appointment.   If you need a refill on your cardiac medications before your next appointment, please call your pharmacy.

## 2017-03-21 DIAGNOSIS — Z961 Presence of intraocular lens: Secondary | ICD-10-CM | POA: Diagnosis not present

## 2017-03-21 DIAGNOSIS — H1851 Endothelial corneal dystrophy: Secondary | ICD-10-CM | POA: Diagnosis not present

## 2017-03-21 DIAGNOSIS — H02839 Dermatochalasis of unspecified eye, unspecified eyelid: Secondary | ICD-10-CM | POA: Diagnosis not present

## 2017-03-21 DIAGNOSIS — I10 Essential (primary) hypertension: Secondary | ICD-10-CM | POA: Diagnosis not present

## 2017-05-24 DIAGNOSIS — M25561 Pain in right knee: Secondary | ICD-10-CM | POA: Diagnosis not present

## 2017-05-24 DIAGNOSIS — M1711 Unilateral primary osteoarthritis, right knee: Secondary | ICD-10-CM | POA: Diagnosis not present

## 2017-05-24 DIAGNOSIS — M21061 Valgus deformity, not elsewhere classified, right knee: Secondary | ICD-10-CM | POA: Diagnosis not present

## 2017-05-24 DIAGNOSIS — M25461 Effusion, right knee: Secondary | ICD-10-CM | POA: Diagnosis not present

## 2017-05-31 DIAGNOSIS — M179 Osteoarthritis of knee, unspecified: Secondary | ICD-10-CM | POA: Insufficient documentation

## 2017-05-31 DIAGNOSIS — M25561 Pain in right knee: Secondary | ICD-10-CM | POA: Diagnosis not present

## 2017-05-31 DIAGNOSIS — M1710 Unilateral primary osteoarthritis, unspecified knee: Secondary | ICD-10-CM | POA: Insufficient documentation

## 2017-05-31 DIAGNOSIS — M1711 Unilateral primary osteoarthritis, right knee: Secondary | ICD-10-CM | POA: Diagnosis not present

## 2017-06-07 DIAGNOSIS — M1711 Unilateral primary osteoarthritis, right knee: Secondary | ICD-10-CM | POA: Diagnosis not present

## 2017-06-07 DIAGNOSIS — M25561 Pain in right knee: Secondary | ICD-10-CM | POA: Diagnosis not present

## 2017-06-14 DIAGNOSIS — M25561 Pain in right knee: Secondary | ICD-10-CM | POA: Diagnosis not present

## 2017-06-14 DIAGNOSIS — M1711 Unilateral primary osteoarthritis, right knee: Secondary | ICD-10-CM | POA: Diagnosis not present

## 2017-06-21 DIAGNOSIS — M1711 Unilateral primary osteoarthritis, right knee: Secondary | ICD-10-CM | POA: Diagnosis not present

## 2017-06-21 DIAGNOSIS — M25561 Pain in right knee: Secondary | ICD-10-CM | POA: Diagnosis not present

## 2017-07-12 DIAGNOSIS — M1711 Unilateral primary osteoarthritis, right knee: Secondary | ICD-10-CM | POA: Diagnosis not present

## 2017-07-14 ENCOUNTER — Telehealth: Payer: Self-pay | Admitting: *Deleted

## 2017-07-14 ENCOUNTER — Telehealth: Payer: Self-pay | Admitting: Cardiovascular Disease

## 2017-07-14 NOTE — Telephone Encounter (Signed)
Request for surgical clearance:  1. What type of surgery is being performed? Right TKA-medial and lateral w/wo patella resurfacing   2. When is this surgery scheduled?  10/30/2017   3. Are there any medications that need to be held prior to surgery and how long? None   4. Name of physician performing surgery?  [facility]    5. What is your office phone and fax number? Phone [phone] Fax [phone] 6. Anesthesia: Choice

## 2017-07-14 NOTE — Telephone Encounter (Signed)
Surgical clearance form received from [facility].  Patient has not been seen by Dr. Sarajane Jews since 2015. Called patient and scheduled surgical clearance appt for next week. Surgical clearance form placed in Shelby's red folder w/ sticky note to hold for appt.

## 2017-07-17 NOTE — Telephone Encounter (Signed)
   Primary Cardiologist: Jenkins Rouge, MD  Chart reviewed as part of pre-operative protocol coverage. Given past medical history and time since last visit, based on ACC/AHA guidelines, [name] [name] would be at acceptable risk for the planned procedure without further cardiovascular testing. He is able to achieve > 4 Mets of activity. The patient also has appointment with Dr. Johnsie Cancel on 09/15/17 prior to surgery.   I will route this recommendation to the requesting party via Epic fax function and remove from pre-op pool.  Please call with questions.  [place], [place] 07/17/2017, 2:49 PM

## 2017-07-18 ENCOUNTER — Encounter: Payer: Self-pay | Admitting: Family Medicine

## 2017-07-18 ENCOUNTER — Ambulatory Visit: Payer: Medicare Other | Admitting: Family Medicine

## 2017-07-18 ENCOUNTER — Ambulatory Visit (INDEPENDENT_AMBULATORY_CARE_PROVIDER_SITE_OTHER): Payer: Medicare Other | Admitting: Family Medicine

## 2017-07-18 VITALS — BP 118/62 | HR 78 | Temp 98.2°F | Wt 151.6 lb

## 2017-07-18 DIAGNOSIS — G8929 Other chronic pain: Secondary | ICD-10-CM | POA: Insufficient documentation

## 2017-07-18 DIAGNOSIS — I350 Nonrheumatic aortic (valve) stenosis: Secondary | ICD-10-CM | POA: Diagnosis not present

## 2017-07-18 DIAGNOSIS — M25561 Pain in right knee: Secondary | ICD-10-CM | POA: Diagnosis not present

## 2017-07-18 DIAGNOSIS — R55 Syncope and collapse: Secondary | ICD-10-CM | POA: Diagnosis not present

## 2017-07-18 DIAGNOSIS — I2510 Atherosclerotic heart disease of native coronary artery without angina pectoris: Secondary | ICD-10-CM

## 2017-07-18 DIAGNOSIS — I10 Essential (primary) hypertension: Secondary | ICD-10-CM | POA: Diagnosis not present

## 2017-07-18 NOTE — Progress Notes (Signed)
   Subjective:    Patient ID: [name] [name], male    DOB: [birth year], 82 y.o.   MRN: [id number]  HPI Here for surgical clearance. He is planning on having a right total knee replacement surgery with patellar resurfacing per Dr. Wynelle Link soon. He has already been cleared by Dr. Johnsie Cancel from a cardiology perspective. He had a normal stress test in November 2018 and the ECHO in May 2018 showed stable mild to moderate aortic stenosis. His last EKG showed first degree AV block, sinus rhythm, and afrequent PACs and PVCs. He feels good other than knee pain.    Review of Systems  Constitutional: Negative.   Respiratory: Negative.   Cardiovascular: Negative.   Gastrointestinal: Negative.   Genitourinary: Negative.   Musculoskeletal: Positive for arthralgias.  Neurological: Negative.        Objective:   Physical Exam  Constitutional: He is oriented to person, place, and time. He appears well-developed and well-nourished.  Neck: No thyromegaly present.  Cardiovascular: Normal rate and intact distal pulses.  Irregular rhythm. He has a 3/6 SM along the left sternal margin   Pulmonary/Chest: Effort normal and breath sounds normal. No respiratory distress. He has no wheezes. He has no rales.  Lymphadenopathy:    He has no cervical adenopathy.  Neurological: He is alert and oriented to person, place, and time.          Assessment & Plan:  He is cleared for the upcoming TKR surgery. He will hold his aspirin 5 days prior to the surgery.  Alysia Penna, MD

## 2017-07-21 ENCOUNTER — Ambulatory Visit: Payer: Medicare Other | Admitting: Family Medicine

## 2017-08-17 DIAGNOSIS — M1711 Unilateral primary osteoarthritis, right knee: Secondary | ICD-10-CM | POA: Diagnosis not present

## 2017-09-11 ENCOUNTER — Ambulatory Visit (INDEPENDENT_AMBULATORY_CARE_PROVIDER_SITE_OTHER): Payer: Medicare Other | Admitting: Internal Medicine

## 2017-09-11 ENCOUNTER — Encounter: Payer: Self-pay | Admitting: Internal Medicine

## 2017-09-11 VITALS — BP 144/80 | HR 86 | Temp 97.6°F | Wt 151.3 lb

## 2017-09-11 DIAGNOSIS — I10 Essential (primary) hypertension: Secondary | ICD-10-CM | POA: Diagnosis not present

## 2017-09-11 DIAGNOSIS — M1711 Unilateral primary osteoarthritis, right knee: Secondary | ICD-10-CM | POA: Diagnosis not present

## 2017-09-11 DIAGNOSIS — E785 Hyperlipidemia, unspecified: Secondary | ICD-10-CM | POA: Diagnosis not present

## 2017-09-11 DIAGNOSIS — I2510 Atherosclerotic heart disease of native coronary artery without angina pectoris: Secondary | ICD-10-CM | POA: Diagnosis not present

## 2017-09-11 DIAGNOSIS — Z79899 Other long term (current) drug therapy: Secondary | ICD-10-CM | POA: Diagnosis not present

## 2017-09-11 DIAGNOSIS — I350 Nonrheumatic aortic (valve) stenosis: Secondary | ICD-10-CM | POA: Diagnosis not present

## 2017-09-11 DIAGNOSIS — R110 Nausea: Secondary | ICD-10-CM | POA: Diagnosis not present

## 2017-09-11 DIAGNOSIS — R6889 Other general symptoms and signs: Secondary | ICD-10-CM

## 2017-09-11 LAB — CBC WITH DIFFERENTIAL/PLATELET
BASOS PCT: 0.5 % (ref 0.0–3.0)
Basophils Absolute: 0 10*3/uL (ref 0.0–0.1)
EOS ABS: 0.1 10*3/uL (ref 0.0–0.7)
Eosinophils Relative: 2.2 % (ref 0.0–5.0)
HCT: 46 % (ref 39.0–52.0)
Hemoglobin: 16.2 g/dL (ref 13.0–17.0)
LYMPHS ABS: 0.9 10*3/uL (ref 0.7–4.0)
Lymphocytes Relative: 16.4 % (ref 12.0–46.0)
MCHC: 35.3 g/dL (ref 30.0–36.0)
MCV: 96.7 fl (ref 78.0–100.0)
MONO ABS: 0.9 10*3/uL (ref 0.1–1.0)
Monocytes Relative: 15.1 % — ABNORMAL HIGH (ref 3.0–12.0)
NEUTROS ABS: 3.8 10*3/uL (ref 1.4–7.7)
Neutrophils Relative %: 65.8 % (ref 43.0–77.0)
PLATELETS: 398 10*3/uL (ref 150.0–400.0)
RBC: 4.75 Mil/uL (ref 4.22–5.81)
RDW: 13 % (ref 11.5–15.5)
WBC: 5.7 10*3/uL (ref 4.0–10.5)

## 2017-09-11 LAB — LIPID PANEL
CHOL/HDL RATIO: 2
Cholesterol: 142 mg/dL (ref 0–200)
HDL: 61.8 mg/dL (ref >39.00)
LDL CALC: 60 mg/dL (ref 0–99)
NonHDL: 80.14
TRIGLYCERIDES: 101 mg/dL (ref 0.0–149.0)
VLDL: 20.2 mg/dL (ref 0.0–40.0)

## 2017-09-11 LAB — COMPREHENSIVE METABOLIC PANEL
ALT: 25 U/L (ref 0–53)
AST: 25 U/L (ref 0–37)
Albumin: 4.3 g/dL (ref 3.5–5.2)
Alkaline Phosphatase: 62 U/L (ref 39–117)
BUN: 14 mg/dL (ref 6–23)
CALCIUM: 9.7 mg/dL (ref 8.4–10.5)
CHLORIDE: 95 meq/L — AB (ref 96–112)
CO2: 32 meq/L (ref 19–32)
CREATININE: 0.78 mg/dL (ref 0.40–1.50)
GFR: 101.77 mL/min (ref >60.00)
Glucose, Bld: 86 mg/dL (ref 70–99)
Potassium: 3.8 mEq/L (ref 3.5–5.1)
SODIUM: 133 meq/L — AB (ref 135–145)
Total Bilirubin: 0.8 mg/dL (ref 0.2–1.2)
Total Protein: 6.8 g/dL (ref 6.0–8.3)

## 2017-09-11 LAB — TSH: TSH: 1.73 u[IU]/mL (ref 0.35–4.50)

## 2017-09-11 NOTE — Patient Instructions (Addendum)
You exam is reassuring   Your heart rate is normal at the exam today.  ekg  Will send results to dr Johnsie Cancel   Will compare but no acute changes   Blood work today checking   Kidneys   Chemistry  Blood count  Cholesterol   Today   Stay hydrated  And   Keep appt with dr Johnsie Cancel this Friday . [name] [name]

## 2017-09-11 NOTE — Progress Notes (Signed)
Chief Complaint  Patient presents with  . Nausea    Pt c/o nausea and weakness. Nausea present most of the day. Still has good appetite. No vomiting. Pt denies dizziness.  Pt is scheduled for knee surgery in about 5 weeks and is needing to get well so that it is not cancelled. Echo is scheduled for Friday 5/3 as part of his pre-op.    HPI: [name] [name] 82 y.o. come in for   Walk in concern  .   pcp NA   Has hx of knee arthritis  And avdiseas  Cad s/p stent and  Hx of pbvcs     waks in   Because   3-4 days doesn't feel right nausea   No chang ein bowels havits   No dizzy  .  Is a bigt anxious it could be heart and is to have surgery for knee in May   d to ave echo and  appt dr Johnsie Cancel  This week May 3rd.   On low dose as   onset some what sudden onset   but no cp sob.  Syncope  Fever No palpitations  No cp sob but hasnt been as active cause of his knee  Had shots in kness   To have  tka   Alusio.  Bakers cyst.  ROS: See pertinent positives and negatives per HPI. No co sob  Worried about heart status no desciptive pain   No vomiting diarrhea fever   Syncope .  No sweating vertigo neuro sx   Has had to stop golf since knee for the last 2 weeks   Past Medical History:  Diagnosis Date  . Abscess of groin, left   . Arthritis    R hip & knees  . Cancer ([place])    basal cell, history of   . Closed fracture of right foot    a. 08/2014 Fx of R fifth metatarsal (Jones Fx).  . Coronary artery disease    a. 11/2007 Cath/PCI: LM nl, LAD 40-38m, d1 40-50ost, LCX nl, RCA 90d (Platinum Study DES - promus), EF 60%.  . Gastroesophageal reflux disease with hiatal hernia   . H/O hiatal hernia   . Hx MRSA infection   . Hypertension   . Pre-syncope    a. 08/2014 w/ sinus bradycardia in 40's in ED.    Family History  Problem Relation Age of Onset  . Hypertension Sister   . Hypertension Brother   . Stomach cancer Father        family hx  . Hypertension Unknown        family hx  . Heart disease  Unknown        family hx  . Heart attack Neg Hx   . Stroke Neg Hx     Social History   Socioeconomic History  . Marital status: Married    Spouse name: Not on file  . Number of children: Not on file  . Years of education: Not on file  . Highest education level: Not on file  Occupational History  . Not on file  Social Needs  . Financial resource strain: Not on file  . Food insecurity:    Worry: Not on file    Inability: Not on file  . Transportation needs:    Medical: Not on file    Non-medical: Not on file  Tobacco Use  . Smoking status: Former Smoker    Last attempt to quit: 09/28/1961    Years  since quitting: 55.9  . Smokeless tobacco: Never Used  Substance and Sexual Activity  . Alcohol use: Yes    Alcohol/week: 0.0 oz    Comment: occ  . Drug use: No  . Sexual activity: Not on file  Lifestyle  . Physical activity:    Days per week: Not on file    Minutes per session: Not on file  . Stress: Not on file  Relationships  . Social connections:    Talks on phone: Not on file    Gets together: Not on file    Attends religious service: Not on file    Active member of club or organization: Not on file    Attends meetings of clubs or organizations: Not on file    Relationship status: Not on file  Other Topics Concern  . Not on file  Social History Narrative  . Not on file    Outpatient Medications Prior to Visit  Medication Sig Dispense Refill  . aspirin EC 81 MG tablet Take 1 tablet (81 mg total) by mouth daily.    . benazepril-hydrochlorthiazide (LOTENSIN HCT) 20-25 MG tablet TAKE 1 TABLET BY MOUTH EVERY MORNING. 90 tablet 1  . cholecalciferol (VITAMIN D) 1000 UNITS tablet Take 2,000 Units by mouth daily.    . Multiple Vitamin (MULTIVITAMIN) tablet Take 1 tablet by mouth daily.    . nitroGLYCERIN (NITROSTAT) 0.4 MG SL tablet Place 0.4 mg under the tongue every 5 (five) minutes as needed for chest pain (3 doses MAX).    Marland Kitchen rosuvastatin (CRESTOR) 20 MG tablet TAKE  1 TABLET (20 MG TOTAL) BY MOUTH EVERY OTHER DAY. 90 tablet 1  . doxycycline (VIBRAMYCIN) 100 MG capsule Take 100 mg by mouth 2 (two) times daily as needed (for dental appointments).     No facility-administered medications prior to visit.      EXAM:  BP (!) 144/80 (BP Location: Right Arm, Patient Position: Sitting, Cuff Size: Normal)   Pulse 86   Temp 97.6 F (36.4 C) (Oral)   Wt 151 lb 4.8 oz (68.6 kg)   BMI 21.71 kg/m   Body mass index is 21.71 kg/m.  GENERAL: vitals reviewed and listed above, alert, oriented, appears well hydrated and in no acute distress favors  Right leg   Gait  But non toxic  HEENT: atraumatic, conjunctiva  clear, no obvious abnormalities on inspection of external nose and ears NECK: no obvious masses on inspection palpation  LUNGS: clear to auscultation bilaterally, no wheezes, rales or rhonchi, good air movement CV: HRRR, 3/6 murmur usp and  llb to apex .  no clubbing cyanosis or  peripheral edema nl cap refill  Abdomen:  Sof,t normal bowel sounds without hepatosplenomegaly, no guarding rebound or masses no CVA tenderness MS: moves all extremities favoring  right knee   Neuro grossly non focal  Nl speech  PSYCH: pleasant and cooperative, no obvious depression  mikldy anxious anxiety ekg frequent  pvcs   1deg av block no acute findings  . Sinus  BP Readings from Last 3 Encounters:  09/11/17 (!) 144/80  07/18/17 118/62  03/17/17 126/74   Wt Readings from Last 3 Encounters:  09/11/17 151 lb 4.8 oz (68.6 kg)  07/18/17 151 lb 9.6 oz (68.8 kg)  03/17/17 152 lb 12.8 oz (69.3 kg)    Ate this am Cheerio   And coffe breakfast .  Today   ASSESSMENT AND PLAN:  Discussed the following assessment and plan:  Nausea - Plan: EKG 12-Lead, CBC with  Differential/Platelet, CMP, Lipid panel, TSH  Not feeling great  Essential hypertension - Plan: CBC with Differential/Platelet, CMP, Lipid panel, TSH  Coronary artery disease involving native coronary artery of  native heart without angina pectoris - Plan: EKG 12-Lead, CBC with Differential/Platelet, CMP, Lipid panel, TSH  Aortic valve stenosis, etiology of cardiac valve disease unspecified - Plan: CBC with Differential/Platelet, CMP, Lipid panel, TSH  Elevated lipids - Plan: CBC with Differential/Platelet, CMP, Lipid panel, TSH  Medication management - Plan: CBC with Differential/Platelet, CMP, Lipid panel, TSH  Osteoarthritis of right knee, unspecified osteoarthritis type Last lab monitoring  8 2017    In epic  Will plan lab r/o metabolic today  And send info to dr Johnsie Cancel to keep appt for MAY 3 assumking all ok .  -Patient advised to return or notify health care team  if  new concerns arise.  Patient Instructions  You exam is reassuring   Your heart rate is normal at the exam today.  ekg  Will send results to dr Johnsie Cancel   Will compare but no acute changes   Blood work today checking   Kidneys   Chemistry  Blood count  Cholesterol   Today   Stay hydrated  And   Keep appt with dr Johnsie Cancel this Friday . Marland Kitchen      Standley Brooking. Panosh M.D.

## 2017-09-13 DIAGNOSIS — K2950 Unspecified chronic gastritis without bleeding: Secondary | ICD-10-CM | POA: Diagnosis not present

## 2017-09-13 DIAGNOSIS — R110 Nausea: Secondary | ICD-10-CM | POA: Diagnosis not present

## 2017-09-14 NOTE — Progress Notes (Signed)
CARDIOLOGY OFFICE NOTE  Date:  09/15/2017    Norton Pastel Cobaugh Date of Birth: 07/19/1935 Medical Record #[id number]  PCP:  Laurey Morale, MD  Cardiologist:  Andrez Grime chief complaint on file.   History of Present Illness: [name] [name] is a 82 y.o. male  f/u CAD.     He has a hx of CAD status post DES to the RCA in 2009, HTN, & HLD History of bradycardia on beta blocker associated with dizziness . Event monitor was arranged. Apparently, according to the notes in the chart, this demonstrated NSR, PACs and no significant arrhythmia.  Myovue 09/30/16 old inferior MI no ischemia EF 56% low risk Echo 09/30/16 EF 40-98% grade 2 diastolic mild AS mean gradient 10 mmHg peak 21 mmHg Carotid: 09/22/16 Plaque no stenosis f/u May 2020  Daughter works at [employer] currently  Reviewed echo from today 09/15/17 and EF remains normal with mean gradient 15 mmHg peak 32 mmHg  His heart has been stable ok to have right TKR with Dr Maureen Ralphs in June Suspect we will be called  About his rhythm   Past Medical History:  Diagnosis Date  . Abscess of groin, left   . Arthritis    R hip & knees  . Cancer ([place])    basal cell, history of   . Closed fracture of right foot    a. 08/2014 Fx of R fifth metatarsal (Jones Fx).  . Coronary artery disease    a. 11/2007 Cath/PCI: LM nl, LAD 40-37m, d1 40-50ost, LCX nl, RCA 90d (Platinum Study DES - promus), EF 60%.  . Gastroesophageal reflux disease with hiatal hernia   . H/O hiatal hernia   . Hx MRSA infection   . Hypertension   . Pre-syncope    a. 08/2014 w/ sinus bradycardia in 40's in ED.    Past Surgical History:  Procedure Laterality Date  . APPENDECTOMY    . CARDIAC CATHETERIZATION  12/06/07   60%  . COLONOSCOPY    . CORONARY ANGIOPLASTY WITH STENT PLACEMENT    . HERNIA REPAIR    . KNEE ARTHROSCOPY Right 10/03/2012   Procedure: RIGHT KNEEE ARTHROSCOPY WITH DEBRIDEMENT ;  Surgeon: Gearlean Alf, MD;  Location: [facility];  Service: Orthopedics;  Laterality: Right;  . TOTAL HIP ARTHROPLASTY Right 04/05/2013   Procedure: RIGHT TOTAL HIP ARTHROPLASTY ANTERIOR APPROACH;  Surgeon: Gearlean Alf, MD;  Location: [facility];  Service: Orthopedics;  Laterality: Right;  . YAG LASER APPLICATION     for glaucoma - both eyes, no drops as of yet      Medications: Current Outpatient Medications  Medication Sig Dispense Refill  . acetaminophen (TYLENOL) 500 MG tablet Take 1,000 mg by mouth 2 (two) times daily.    Marland Kitchen aspirin EC 81 MG tablet Take 1 tablet (81 mg total) by mouth daily.    . benazepril-hydrochlorthiazide (LOTENSIN HCT) 20-25 MG tablet TAKE 1 TABLET BY MOUTH EVERY MORNING. 90 tablet 1  . cholecalciferol (VITAMIN D) 1000 UNITS tablet Take 2,000 Units by mouth daily.    . Multiple Vitamin (MULTIVITAMIN) tablet Take 1 tablet by mouth daily.    . nitroGLYCERIN (NITROSTAT) 0.4 MG SL tablet Place 0.4 mg under the tongue every 5 (five) minutes as needed for chest pain (3 doses MAX).    Marland Kitchen rosuvastatin (CRESTOR) 20 MG tablet TAKE 1 TABLET (20 MG TOTAL) BY MOUTH EVERY OTHER DAY. 90 tablet 1   No current facility-administered  medications for this visit.     Allergies: No Known Allergies  Social History: The patient  reports that he quit smoking about 56 years ago. He has never used smokeless tobacco. He reports that he drinks alcohol. He reports that he does not use drugs.   Family History: The patient's family history includes Heart disease in his unknown relative; Hypertension in his brother, sister, and unknown relative; Stomach cancer in his father.   Review of Systems: Please see the history of present illness.   Otherwise, the review of systems is positive for none.   All other systems are reviewed and negative.   Physical Exam: VS:  BP (!) 144/74   Pulse 68   Ht 5\' 10"  (1.778 m)   Wt 148 lb (67.1 kg)   SpO2 99%   BMI 21.24 kg/m  .  BMI Body mass index is 21.24 kg/m.  Wt Readings from Last 3  Encounters:  09/15/17 148 lb (67.1 kg)  09/11/17 151 lb 4.8 oz (68.6 kg)  07/18/17 151 lb 9.6 oz (68.8 kg)    Affect appropriate Healthy:  appears stated age HEENT: normal Neck supple with no adenopathy JVP normal AS murmur referred to right carotid   no thyromegaly Lungs clear with no wheezing and good diaphragmatic motion Heart:  S1/S2 preserved AS  murmur, no rub, gallop or click PMI normal Abdomen: benighn, BS positve, no tenderness, no AAA no bruit.  No HSM or HJR Distal pulses intact with no bruits No edema Neuro non-focal Skin warm and dry Right knee osteoarthritis with crepitus    LABORATORY DATA:  EKG:   09/16/16  SR wendebach 3:2   Lab Results  Component Value Date   WBC 5.7 09/11/2017   HGB 16.2 09/11/2017   HCT 46.0 09/11/2017   PLT 398.0 09/11/2017   GLUCOSE 86 09/11/2017   CHOL 142 09/11/2017   TRIG 101.0 09/11/2017   HDL 61.80 09/11/2017   LDLCALC 60 09/11/2017   ALT 25 09/11/2017   AST 25 09/11/2017   NA 133 (L) 09/11/2017   K 3.8 09/11/2017   CL 95 (L) 09/11/2017   CREATININE 0.78 09/11/2017   BUN 14 09/11/2017   CO2 32 09/11/2017   TSH 1.73 09/11/2017   PSA 0.66 11/30/2009   INR 1.02 02/19/2014   HGBA1C  12/07/2007    5.5 (NOTE)   The ADA recommends the following therapeutic goal for glycemic   control related to Hgb A1C measurement:   Goal of Therapy:   < 7.0% Hgb A1C   Reference: American Diabetes Association: Clinical Practice   Recommendations 2008, Diabetes Care,  2008, 31:(Suppl 1).    BNP (last 3 results) No results for input(s): BNP in the last 8760 hours.  ProBNP (last 3 results) No results for input(s): PROBNP in the last 8760 hours.   Other Studies Reviewed Today:  Echo 09/08/14 - EF 55%to 60%. Wall motion was normal; Grade 1 diastolicdysfunction. - Aortic valve: Moderately calcified annulus. Trileaflet. Moderatethickening and calcification, consistent with sclerosis. - Mitral valve: There was mild regurgitation. -  Pulmonic valve: There was trivial regurgitation.  LHC 11/2007 LM: Normal LAD: Proximal to mid 40-50%, ostial D1 40-50% LCx: Normal RCA: Distal 90% EF 60% PCI: DES to the RCA    Assessment/Plan:  CAD:  No angina.  Continue ASA, ACE inhibitor, statin. Non ischemic myovue May 2018 clear to have knee surgery  Benign essential HTN:  Well controlled.  Continue current medications and low sodium Dash type diet.  Hyperlipidemia:  Continue statin labs with primary   Bradycardia:  History of AV block and wenckebach beta blocker stopped  No indication for pacer evaluation   Murmur:  Mild to moderate AS slight progression mean gradient 15 mmHg echo 09/15/17 f/u in a year   Bruit:  Right side ASA  [place] palque no stenosis likely transmitted AS murmur   Ortho:  Needs R TKR with Dr Maureen Ralphs clear to have this in June  Disposition:   f/u in a year    Jenkins Rouge

## 2017-09-15 ENCOUNTER — Other Ambulatory Visit: Payer: Self-pay

## 2017-09-15 ENCOUNTER — Ambulatory Visit (INDEPENDENT_AMBULATORY_CARE_PROVIDER_SITE_OTHER): Payer: Medicare Other | Admitting: Cardiovascular Disease

## 2017-09-15 ENCOUNTER — Ambulatory Visit (HOSPITAL_COMMUNITY): Payer: Medicare Other | Attending: Cardiology

## 2017-09-15 ENCOUNTER — Encounter: Payer: Self-pay | Admitting: Cardiovascular Disease

## 2017-09-15 VITALS — BP 144/74 | HR 68 | Ht 70.0 in | Wt 148.0 lb

## 2017-09-15 DIAGNOSIS — I2510 Atherosclerotic heart disease of native coronary artery without angina pectoris: Secondary | ICD-10-CM | POA: Diagnosis not present

## 2017-09-15 DIAGNOSIS — I10 Essential (primary) hypertension: Secondary | ICD-10-CM | POA: Diagnosis not present

## 2017-09-15 DIAGNOSIS — R55 Syncope and collapse: Secondary | ICD-10-CM | POA: Diagnosis not present

## 2017-09-15 DIAGNOSIS — Z87891 Personal history of nicotine dependence: Secondary | ICD-10-CM | POA: Diagnosis not present

## 2017-09-15 DIAGNOSIS — I350 Nonrheumatic aortic (valve) stenosis: Secondary | ICD-10-CM | POA: Insufficient documentation

## 2017-09-15 DIAGNOSIS — R011 Cardiac murmur, unspecified: Secondary | ICD-10-CM | POA: Diagnosis not present

## 2017-09-15 NOTE — Patient Instructions (Signed)

## 2017-09-22 DIAGNOSIS — M1711 Unilateral primary osteoarthritis, right knee: Secondary | ICD-10-CM | POA: Diagnosis not present

## 2017-09-27 NOTE — Progress Notes (Signed)
Need orders in epic for 6-17 surgery

## 2017-10-11 ENCOUNTER — Other Ambulatory Visit: Payer: Self-pay | Admitting: Cardiovascular Disease

## 2017-10-18 ENCOUNTER — Other Ambulatory Visit (HOSPITAL_COMMUNITY): Payer: Self-pay | Admitting: Emergency Medicine

## 2017-10-18 NOTE — Patient Instructions (Signed)
Boris Engelmann Shippy  10/18/2017   Your procedure is scheduled on: 10-30-17   Report to [facility] Main  Entrance    Report to admitting at 5:30AM    Call this number if you have problems the morning of surgery [phone]     Remember: Do not eat food or drink liquids :After Midnight.     Take these medicines the morning of surgery with A SIP OF WATER: NONE                                 You may not have any metal on your body including hair pins and              piercings  Do not wear jewelry, make-up, lotions, powders or perfumes, deodorant              Men may shave face and neck.   Do not bring valuables to the hospital. [place].  Contacts, dentures or bridgework may not be worn into surgery.  Leave suitcase in the car. After surgery it may be brought to your room.                 Please read over the following fact sheets you were given: _____________________________________________________________________             [facility] - Preparing for Surgery Before surgery, you can play an important role.  Because skin is not sterile, your skin needs to be as free of germs as possible.  You can reduce the number of germs on your skin by washing with CHG (chlorahexidine gluconate) soap before surgery.  CHG is an antiseptic cleaner which kills germs and bonds with the skin to continue killing germs even after washing. Please DO NOT use if you have an allergy to CHG or antibacterial soaps.  If your skin becomes reddened/irritated stop using the CHG and inform your nurse when you arrive at Short Stay. Do not shave (including legs and underarms) for at least 48 hours prior to the first CHG shower.  You may shave your face/neck. Please follow these instructions carefully:  1.  Shower with CHG Soap the night before surgery and the  morning of Surgery.  2.  If you choose to wash your hair, wash your hair first  as usual with your  normal  shampoo.  3.  After you shampoo, rinse your hair and body thoroughly to remove the  shampoo.                           4.  Use CHG as you would any other liquid soap.  You can apply chg directly  to the skin and wash                       Gently with a scrungie or clean washcloth.  5.  Apply the CHG Soap to your body ONLY FROM THE NECK DOWN.   Do not use on face/ open                           Wound or open sores. Avoid contact  with eyes, ears mouth and genitals (private parts).                       Wash face,  Genitals (private parts) with your normal soap.             6.  Wash thoroughly, paying special attention to the area where your surgery  will be performed.  7.  Thoroughly rinse your body with warm water from the neck down.  8.  DO NOT shower/wash with your normal soap after using and rinsing off  the CHG Soap.                9.  Pat yourself dry with a clean towel.            10.  Wear clean pajamas.            11.  Place clean sheets on your bed the night of your first shower and do not  sleep with pets. Day of Surgery : Do not apply any lotions/deodorants the morning of surgery.  Please wear clean clothes to the hospital/surgery center.  FAILURE TO FOLLOW THESE INSTRUCTIONS MAY RESULT IN THE CANCELLATION OF YOUR SURGERY PATIENT SIGNATURE_________________________________  NURSE SIGNATURE__________________________________  ________________________________________________________________________   Adam Phenix  An incentive spirometer is a tool that can help keep your lungs clear and active. This tool measures how well you are filling your lungs with each breath. Taking long deep breaths may help reverse or decrease the chance of developing breathing (pulmonary) problems (especially infection) following:  A long period of time when you are unable to move or be active. BEFORE THE PROCEDURE   If the spirometer includes an indicator to show your  best effort, your nurse or respiratory therapist will set it to a desired goal.  If possible, sit up straight or lean slightly forward. Try not to slouch.  Hold the incentive spirometer in an upright position. INSTRUCTIONS FOR USE  1. Sit on the edge of your bed if possible, or sit up as far as you can in bed or on a chair. 2. Hold the incentive spirometer in an upright position. 3. Breathe out normally. 4. Place the mouthpiece in your mouth and seal your lips tightly around it. 5. Breathe in slowly and as deeply as possible, raising the piston or the ball toward the top of the column. 6. Hold your breath for 3-5 seconds or for as long as possible. Allow the piston or ball to fall to the bottom of the column. 7. Remove the mouthpiece from your mouth and breathe out normally. 8. Rest for a few seconds and repeat Steps 1 through 7 at least 10 times every 1-2 hours when you are awake. Take your time and take a few normal breaths between deep breaths. 9. The spirometer may include an indicator to show your best effort. Use the indicator as a goal to work toward during each repetition. 10. After each set of 10 deep breaths, practice coughing to be sure your lungs are clear. If you have an incision (the cut made at the time of surgery), support your incision when coughing by placing a pillow or rolled up towels firmly against it. Once you are able to get out of bed, walk around indoors and cough well. You may stop using the incentive spirometer when instructed by your caregiver.  RISKS AND COMPLICATIONS  Take your time so you do not get dizzy or light-headed.  If you  are in pain, you may need to take or ask for pain medication before doing incentive spirometry. It is harder to take a deep breath if you are having pain. AFTER USE  Rest and breathe slowly and easily.  It can be helpful to keep track of a log of your progress. Your caregiver can provide you with a simple table to help with this. If  you are using the spirometer at home, follow these instructions: [place] IF:   You are having difficultly using the spirometer.  You have trouble using the spirometer as often as instructed.  Your pain medication is not giving enough relief while using the spirometer.  You develop fever of 100.5 F (38.1 C) or higher. SEEK IMMEDIATE MEDICAL CARE IF:   You cough up bloody sputum that had not been present before.  You develop fever of 102 F (38.9 C) or greater.  You develop worsening pain at or near the incision site. MAKE SURE YOU:   Understand these instructions.  Will watch your condition.  Will get help right away if you are not doing well or get worse. Document Released: 09/12/2006 Document Revised: 07/25/2011 Document Reviewed: 11/13/2006 ExitCare Patient Information 2014 ExitCare, [place].   ________________________________________________________________________  WHAT IS A BLOOD TRANSFUSION? Blood Transfusion Information  A transfusion is the replacement of blood or some of its parts. Blood is made up of multiple cells which provide different functions.  Red blood cells carry oxygen and are used for blood loss replacement.  White blood cells fight against infection.  Platelets control bleeding.  Plasma helps clot blood.  Other blood products are available for specialized needs, such as hemophilia or other clotting disorders. BEFORE THE TRANSFUSION  Who gives blood for transfusions?   Healthy volunteers who are fully evaluated to make sure their blood is safe. This is blood bank blood. Transfusion therapy is the safest it has ever been in the practice of medicine. Before blood is taken from a donor, a complete history is taken to make sure that person has no history of diseases nor engages in risky social behavior (examples are intravenous drug use or sexual activity with multiple partners). The donor's travel history is screened to minimize risk of  transmitting infections, such as malaria. The donated blood is tested for signs of infectious diseases, such as HIV and hepatitis. The blood is then tested to be sure it is compatible with you in order to minimize the chance of a transfusion reaction. If you or a relative donates blood, this is often done in anticipation of surgery and is not appropriate for emergency situations. It takes many days to process the donated blood. RISKS AND COMPLICATIONS Although transfusion therapy is very safe and saves many lives, the main dangers of transfusion include:   Getting an infectious disease.  Developing a transfusion reaction. This is an allergic reaction to something in the blood you were given. Every precaution is taken to prevent this. The decision to have a blood transfusion has been considered carefully by your caregiver before blood is given. Blood is not given unless the benefits outweigh the risks. AFTER THE TRANSFUSION  Right after receiving a blood transfusion, you will usually feel much better and more energetic. This is especially true if your red blood cells have gotten low (anemic). The transfusion raises the level of the red blood cells which carry oxygen, and this usually causes an energy increase.  The nurse administering the transfusion will monitor you carefully for complications. HOME  CARE INSTRUCTIONS  No special instructions are needed after a transfusion. You may find your energy is better. Speak with your caregiver about any limitations on activity for underlying diseases you may have. SEEK MEDICAL CARE IF:   Your condition is not improving after your transfusion.  You develop redness or irritation at the intravenous (IV) site. SEEK IMMEDIATE MEDICAL CARE IF:  Any of the following symptoms occur over the next 12 hours:  Shaking chills.  You have a temperature by mouth above 102 F (38.9 C), not controlled by medicine.  Chest, back, or muscle pain.  People around you  feel you are not acting correctly or are confused.  Shortness of breath or difficulty breathing.  Dizziness and fainting.  You get a rash or develop hives.  You have a decrease in urine output.  Your urine turns a dark color or changes to pink, red, or brown. Any of the following symptoms occur over the next 10 days:  You have a temperature by mouth above 102 F (38.9 C), not controlled by medicine.  Shortness of breath.  Weakness after normal activity.  The white part of the eye turns yellow (jaundice).  You have a decrease in the amount of urine or are urinating less often.  Your urine turns a dark color or changes to pink, red, or brown. Document Released: 04/29/2000 Document Revised: 07/25/2011 Document Reviewed: 12/17/2007 [facility] Patient Information 2014 [place], [place].  _______________________________________________________________________

## 2017-10-18 NOTE — Progress Notes (Addendum)
LOV/CARDIAC CLEARANCE DR. PETER NISHAN 09-15-17 Epic   ECHO 09-15-17 Epic   EKG 09-11-17 Epic   ECHO 09-30-16 Epic   STRESS TEST 09-30-16 Epic   Medical clearance Dr Sharlene Motts on chart

## 2017-10-19 ENCOUNTER — Encounter (HOSPITAL_COMMUNITY): Payer: Self-pay

## 2017-10-19 ENCOUNTER — Other Ambulatory Visit: Payer: Self-pay

## 2017-10-19 ENCOUNTER — Encounter (HOSPITAL_COMMUNITY)
Admission: RE | Admit: 2017-10-19 | Discharge: 2017-10-19 | Disposition: A | Discharge status: Home / Self Care | Payer: Medicare Other | Source: Ambulatory Visit | Attending: Orthopedic Surgery | Admitting: Orthopedic Surgery

## 2017-10-19 DIAGNOSIS — M1711 Unilateral primary osteoarthritis, right knee: Secondary | ICD-10-CM | POA: Insufficient documentation

## 2017-10-19 DIAGNOSIS — Z01818 Encounter for other preprocedural examination: Secondary | ICD-10-CM | POA: Insufficient documentation

## 2017-10-19 LAB — CBC
HCT: 47.3 % (ref 39.0–52.0)
Hemoglobin: 16.5 g/dL (ref 13.0–17.0)
MCH: 33.5 pg (ref 26.0–34.0)
MCHC: 34.9 g/dL (ref 30.0–36.0)
MCV: 96.1 fL (ref 78.0–100.0)
Platelets: 348 10*3/uL (ref 150–400)
RBC: 4.92 MIL/uL (ref 4.22–5.81)
RDW: 13.5 % (ref 11.5–15.5)
WBC: 11.3 10*3/uL — ABNORMAL HIGH (ref 4.0–10.5)

## 2017-10-19 LAB — COMPREHENSIVE METABOLIC PANEL
ALT: 26 U/L (ref 17–63)
AST: 25 U/L (ref 15–41)
Albumin: 4.6 g/dL (ref 3.5–5.0)
Alkaline Phosphatase: 55 U/L (ref 38–126)
Anion gap: 10 (ref 5–15)
BUN: 21 mg/dL — ABNORMAL HIGH (ref 6–20)
CO2: 28 mmol/L (ref 22–32)
Calcium: 9.4 mg/dL (ref 8.9–10.3)
Chloride: 99 mmol/L — ABNORMAL LOW (ref 101–111)
Creatinine, Ser: 0.87 mg/dL (ref 0.61–1.24)
GFR calc Af Amer: >60 mL/min (ref >60)
GFR calc non Af Amer: >60 mL/min (ref >60)
Glucose, Bld: 91 mg/dL (ref 65–99)
Potassium: 4.1 mmol/L (ref 3.5–5.1)
Sodium: 137 mmol/L (ref 135–145)
Total Bilirubin: 1.5 mg/dL — ABNORMAL HIGH (ref 0.3–1.2)
Total Protein: 7.4 g/dL (ref 6.5–8.1)

## 2017-10-19 LAB — SURGICAL PCR SCREEN
MRSA, PCR: NEGATIVE
STAPHYLOCOCCUS AUREUS: NEGATIVE

## 2017-10-19 LAB — APTT: aPTT: 30 seconds (ref 24–36)

## 2017-10-19 LAB — TYPE AND SCREEN
ABO/RH(D): A NEG
Antibody Screen: NEGATIVE

## 2017-10-19 LAB — URINALYSIS, ROUTINE W REFLEX MICROSCOPIC
Bilirubin Urine: NEGATIVE
Glucose, UA: NEGATIVE mg/dL
Hgb urine dipstick: NEGATIVE
Ketones, ur: NEGATIVE mg/dL
Leukocytes, UA: NEGATIVE
Nitrite: NEGATIVE
Protein, ur: NEGATIVE mg/dL
Specific Gravity, Urine: 1.016 (ref 1.005–1.030)
pH: 6 (ref 5.0–8.0)

## 2017-10-19 LAB — PROTIME-INR
INR: 1
Prothrombin Time: 13.1 seconds (ref 11.4–15.2)

## 2017-10-19 LAB — ABO/RH: ABO/RH(D): A NEG

## 2017-10-19 NOTE — Progress Notes (Signed)
RN called and spoke to anesthesia Dr. Renold Don to make aware of patient history of aortic valve stenosis ECHO done on 09-15-17 with clearance from his cardiologist . Per Fransisco Beau patient ok to proceed as scheduled

## 2017-10-22 ENCOUNTER — Other Ambulatory Visit: Payer: Self-pay | Admitting: Cardiovascular Disease

## 2017-10-23 NOTE — H&P (Signed)
TOTAL KNEE ADMISSION H&P  Patient is being admitted for right total knee arthroplasty.  Subjective:  Chief Complaint:right knee pain.  HPI: [name] [name], 82 y.o. male, has a history of pain and functional disability in the right knee due to arthritis and has failed non-surgical conservative treatments for greater than 12 weeks to includeNSAID's and/or analgesics, corticosteriod injections, flexibility and strengthening excercises and activity modification.  Onset of symptoms was gradual, starting 8 years ago with gradually worsening course since that time. The patient noted prior procedures on the knee to include  arthroscopy and menisectomy on the right knee(s).  Patient currently rates pain in the right knee(s) at 7 out of 10 with activity. Patient has night pain, worsening of pain with activity and weight bearing, pain that interferes with activities of daily living, pain with passive range of motion, crepitus and joint swelling.  Patient has evidence of periarticular osteophytes and joint space narrowing by imaging studies. There is no active infection.  Patient Active Problem List   Diagnosis Date Noted  . Aortic valve stenosis 07/18/2017  . Chronic pain of right knee 07/18/2017  . Osteoarthritis of knee 05/31/2017  . Abscess of left groin 05/07/2016  . Pre-syncope   . Coronary artery disease   . OA (osteoarthritis) of hip 04/05/2013  . Lateral meniscal tear 10/03/2012  . CONTACT DERMATITIS 12/12/2008  . APHTHOUS ULCERS 09/22/2008  . BOILS, RECURRENT 02/18/2008  . Coronary atherosclerosis 12/10/2007  . Elevated lipids 02/28/2007  . Essential hypertension 02/28/2007   Past Medical History:  Diagnosis Date  . Abscess of groin, left   . Arthritis    R hip & knees  . Cancer ([place])    basal cell, history of   . Closed fracture of right foot    a. 08/2014 Fx of R fifth metatarsal ([facility] Fx).  . Coronary artery disease    a. 11/2007 Cath/PCI: LM nl, LAD 40-64m, d1 40-50ost, LCX  nl, RCA 90d (Platinum Study DES - promus), EF 60%.  . Gastroesophageal reflux disease with hiatal hernia   . H/O hiatal hernia   . Hx MRSA infection   . Hypertension   . Pre-syncope    a. 08/2014 w/ sinus bradycardia in 40's in ED.    Past Surgical History:  Procedure Laterality Date  . APPENDECTOMY    . CARDIAC CATHETERIZATION  12/06/07   60%  . COLONOSCOPY    . CORONARY ANGIOPLASTY WITH STENT PLACEMENT  11/2007  . HERNIA REPAIR    . KNEE ARTHROSCOPY Right 10/03/2012   Procedure: RIGHT KNEEE ARTHROSCOPY WITH DEBRIDEMENT ;  Surgeon: Gearlean Alf, MD;  Location: [facility];  Service: Orthopedics;  Laterality: Right;  . SHOULDER SURGERY  05/2016   rotator cuff and ligament repair   . TOTAL HIP ARTHROPLASTY Right 04/05/2013   Procedure: RIGHT TOTAL HIP ARTHROPLASTY ANTERIOR APPROACH;  Surgeon: Gearlean Alf, MD;  Location: [facility];  Service: Orthopedics;  Laterality: Right;  . YAG LASER APPLICATION     for glaucoma - both eyes, no drops as of yet     No current facility-administered medications for this encounter.    Current Outpatient Medications  Medication Sig Dispense Refill Last Dose  . acetaminophen (TYLENOL) 500 MG tablet Take 1,000 mg by mouth 2 (two) times daily.   Taking  . aspirin EC 81 MG tablet Take 1 tablet (81 mg total) by mouth daily.   Taking  . cholecalciferol (VITAMIN D) 1000 UNITS tablet Take 1,000 Units by mouth daily.  Taking  . Multiple Vitamin (MULTIVITAMIN WITH MINERALS) TABS tablet Take 1 tablet by mouth daily with breakfast.     . nitroGLYCERIN (NITROSTAT) 0.4 MG SL tablet Place 0.4 mg under the tongue every 5 (five) minutes as needed for chest pain (3 doses MAX).   Taking  . Probiotic Product (PROBIOTIC PO) Take 1 capsule by mouth daily with breakfast.     . benazepril-hydrochlorthiazide (LOTENSIN HCT) 20-25 MG tablet TAKE 1 TABLET BY MOUTH EVERY DAY IN THE MORNING 90 tablet 0   . rosuvastatin (CRESTOR) 20 MG tablet TAKE 1 TABLET EVERY OTHER DAY 45 tablet 3     No Known Allergies  Social History   Tobacco Use  . Smoking status: Former Smoker    Last attempt to quit: 09/28/1961    Years since quitting: 56.1  . Smokeless tobacco: Never Used  Substance Use Topics  . Alcohol use: Yes    Alcohol/week: 0.0 oz    Comment: occ    Family History  Problem Relation Age of Onset  . Hypertension Sister   . Hypertension Brother   . Stomach cancer Father        family hx  . Hypertension Unknown        family hx  . Heart disease Unknown        family hx  . Heart attack Neg Hx   . Stroke Neg Hx      Review of Systems  Constitutional: Negative.   Eyes: Negative.   Respiratory: Negative.   Cardiovascular: Negative.   Gastrointestinal: Negative.   Genitourinary: Negative.   Musculoskeletal: Positive for joint pain and myalgias. Negative for back pain, falls and neck pain.  Skin: Negative.   Neurological: Negative.   Endo/Heme/Allergies: Negative.   Psychiatric/Behavioral: Negative.     Objective:  Physical Exam  Constitutional: He is oriented to person, place, and time. He appears well-developed and well-nourished. No distress.  HENT:  Head: Normocephalic and atraumatic.  Right Ear: External ear normal.  Left Ear: External ear normal.  Nose: Nose normal.  Mouth/Throat: Oropharynx is clear and moist.  Eyes: Conjunctivae and EOM are normal.  Neck: Normal range of motion. Neck supple.  Cardiovascular: Normal rate, regular rhythm, normal heart sounds and intact distal pulses.  No murmur heard. Respiratory: Effort normal and breath sounds normal. No respiratory distress. He has no wheezes.  GI: Soft. Bowel sounds are normal. He exhibits no distension. There is no tenderness.  Musculoskeletal:  Antalgic gait without using assisted devices.   Right Knee Exam:  No effusion.  Valgus deformity. Range of motion is 5-125 degrees.  Marked crepitus on range of motion of the knee.  Medial and lateral joint line tenderness, lateral greater  than medial.  Stable knee.   Neurological: He is alert and oriented to person, place, and time. He has normal strength. No sensory deficit.  Skin: No rash noted. He is not diaphoretic. No erythema.  Psychiatric: He has a normal mood and affect. His behavior is normal.   Ht: 5 ft 9 in  Wt: 150 lbs  BMI: 22.2  BP: 124/84  Pulse: 68 bpm    Imaging Review Plain radiographs demonstrate severe degenerative joint disease of the right knee(s). The overall alignment ismild varus. The bone quality appears to be fair for age and reported activity level.   Preoperative templating of the joint replacement has been completed, documented, and submitted to the Operating Room personnel in order to optimize intra-operative equipment management.   Anticipated LOS equal  to or greater than 2 midnights due to - Age 62 and older with one or more of the following:  - Obesity  - Expected need for hospital services (PT, OT, Nursing) required for safe  discharge  - Anticipated need for postoperative skilled nursing care or inpatient rehab  - Active co-morbidities: Coronary Artery Disease OR   - Unanticipated findings during/Post Surgery: None  - Patient is a high risk of re-admission due to: None     Assessment/Plan:  End stage primary osteoarthritis, right knee   The patient history, physical examination, clinical judgment of the provider and imaging studies are consistent with end stage degenerative joint disease of the right knee(s) and total knee arthroplasty is deemed medically necessary. The treatment options including medical management, injection therapy arthroscopy and arthroplasty were discussed at length. The risks and benefits of total knee arthroplasty were presented and reviewed. The risks due to aseptic loosening, infection, stiffness, patella tracking problems, thromboembolic complications and other imponderables were discussed. The patient acknowledged the explanation, agreed to proceed  with the plan and consent was signed. Patient is being admitted for inpatient treatment for surgery, pain control, PT, OT, prophylactic antibiotics, VTE prophylaxis, progressive ambulation and ADL's and discharge planning. The patient is planning to be discharged home.   Therapy Plans: outpatient therapy at [facility] 6/20 Disposition: Home with wife Planned DVT prophylaxis: aspirin 325mg  BID DME needed: none PCP: Dr. Sharlene Motts Cardio: Johnsie Cancel Topical TXA   Ardeen Jourdain, PA-C

## 2017-10-27 ENCOUNTER — Other Ambulatory Visit: Payer: Self-pay | Admitting: Orthopedic Surgery

## 2017-10-27 NOTE — Care Plan (Signed)
Ortho Bundle R TKA scheduled on 10-30-17 DCP:  Home with spouse and sister.  2 story/1 ste. DME:  No needs.  Has a RW and 3-in-1. PT:  [facility].  PT eval scheduled on 11-02-17.

## 2017-10-29 MED ORDER — BUPIVACAINE LIPOSOME 1.3 % IJ SUSP
20.0000 mL | INTRAMUSCULAR | Status: DC
  Filled 2017-10-29: qty 20

## 2017-10-29 MED ORDER — TRANEXAMIC ACID 1000 MG/10ML IV SOLN
2000.0000 mg | INTRAVENOUS | Status: DC
  Filled 2017-10-29: qty 20

## 2017-10-29 NOTE — Anesthesia Preprocedure Evaluation (Addendum)
Anesthesia Evaluation  Patient identified by MRN, date of birth, ID band Patient awake    Reviewed: Allergy & Precautions, H&P , NPO status , Patient's Chart, lab work & pertinent test results  History of Anesthesia Complications Negative for: history of anesthetic complications  Airway Mallampati: I  TM Distance: >3 FB Neck ROM: Full    Dental  (+) Teeth Intact   Pulmonary neg shortness of breath, neg sleep apnea, neg COPD, neg recent URI, former smoker,    breath sounds clear to auscultation       Cardiovascular hypertension, Pt. on medications and Pt. on home beta blockers (-) angina+ CAD and + Cardiac Stents  (-) DOE  Rhythm:Regular  Echo 5/19 Study Conclusions  - Left ventricle: The cavity size was normal. Systolic function was   normal. The estimated ejection fraction was in the range of 55%   to 60%. Wall motion was normal; there were no regional wall   motion abnormalities. There was an increased relative   contribution of atrial contraction to ventricular filling.   Doppler parameters are consistent with abnormal left ventricular   relaxation (grade 1 diastolic dysfunction). - Aortic valve: Valve mobility was restricted. There was mild to   moderate stenosis. Mean gradient (S): 13 mm Hg. Peak gradient   (S): 32 mm Hg. Valve area (VTI): 1.74 cm^2. - Mitral valve: Calcified annulus. There was trivial regurgitation. - Right ventricle: The cavity size was mildly dilated. Wall   thickness was normal. - Right atrium: The atrium was mildly dilated. - Pulmonic valve: There was trivial regurgitation.  Nuclear stress 5/19 EF: 56%.  Blood pressure demonstrated a normal response to exercise.  There was no ST segment deviation noted during stress.  Defect 1: There is a large defect of severe severity present in the basal inferior, mid inferior and apical inferior location.  This is a low risk study.  The left  ventricular ejection fraction is normal (55-65%).   Low risk stress nuclear study with prior inferior MI; no ischemia; EF 56 with hypokinesis of the basal inferior wall.   Neuro/Psych negative neurological ROS  negative psych ROS   GI/Hepatic Neg liver ROS, hiatal hernia, GERD  Controlled,  Endo/Other  negative endocrine ROS  Renal/GU negative Renal ROS     Musculoskeletal  (+) Arthritis ,   Abdominal   Peds  Hematology negative hematology ROS (+)   Anesthesia Other Findings . History includes former smoker, CAD s/p RCA stent '09, HTN, GERD, hiatal hernia, arthritis, skin cancer, chronic DOE, right THA 04/05/13. PCP is Dr. Sarajane Jews. Cardiologist is Dr. Johnsie Cancel  EKG on 01/08/14 showed SR, first degree AVB, PAC's.   He had an abnormal stress test in 2009 which lead to a cardiac cath on 12/06/07 that showed: normal LM, 40-50% bifurcation at the mid LAD and takeoff of D1, 40-50% ostial D1, normal CX, 90% distal RCA, normal PDA and PLA. He subsequently underwent DES to RCA on 12/07/07.    Reproductive/Obstetrics                           Anesthesia Physical  Anesthesia Plan  ASA: III  Anesthesia Plan: Spinal   Post-op Pain Management:  Regional for Post-op pain   Induction: Intravenous  PONV Risk Score and Plan: 1 and Ondansetron and Treatment may vary due to age or medical condition  Airway Management Planned: Nasal Cannula, Natural Airway and Mask  Additional Equipment: None  Intra-op Plan:  Post-operative Plan: Extubation in OR  Informed Consent: I have reviewed the patients History and Physical, chart, labs and discussed the procedure including the risks, benefits and alternatives for the proposed anesthesia with the patient or authorized representative who has indicated his/her understanding and acceptance.   Dental advisory given  Plan Discussed with: CRNA, Anesthesiologist and Surgeon  Anesthesia Plan Comments: (  )        Anesthesia Quick Evaluation

## 2017-10-30 ENCOUNTER — Encounter (HOSPITAL_COMMUNITY)
Admission: RE | Disposition: A | Discharge status: Home / Self Care | Payer: Self-pay | Source: Ambulatory Visit | Attending: Orthopedic Surgery

## 2017-10-30 ENCOUNTER — Encounter (HOSPITAL_COMMUNITY): Payer: Self-pay | Admitting: *Deleted

## 2017-10-30 ENCOUNTER — Inpatient Hospital Stay (HOSPITAL_COMMUNITY): Payer: Medicare Other | Admitting: Anesthesiology

## 2017-10-30 ENCOUNTER — Other Ambulatory Visit: Payer: Self-pay

## 2017-10-30 ENCOUNTER — Inpatient Hospital Stay (HOSPITAL_COMMUNITY)
Admission: RE | Admit: 2017-10-30 | Discharge: 2017-11-01 | DRG: 470 | Disposition: A | Discharge status: Home / Self Care | Payer: Medicare Other | Source: Ambulatory Visit | Attending: Orthopedic Surgery | Admitting: Orthopedic Surgery

## 2017-10-30 DIAGNOSIS — Z87891 Personal history of nicotine dependence: Secondary | ICD-10-CM | POA: Diagnosis not present

## 2017-10-30 DIAGNOSIS — Z7982 Long term (current) use of aspirin: Secondary | ICD-10-CM

## 2017-10-30 DIAGNOSIS — Z955 Presence of coronary angioplasty implant and graft: Secondary | ICD-10-CM | POA: Diagnosis not present

## 2017-10-30 DIAGNOSIS — K219 Gastro-esophageal reflux disease without esophagitis: Secondary | ICD-10-CM | POA: Diagnosis present

## 2017-10-30 DIAGNOSIS — G8918 Other acute postprocedural pain: Secondary | ICD-10-CM | POA: Diagnosis not present

## 2017-10-30 DIAGNOSIS — I10 Essential (primary) hypertension: Secondary | ICD-10-CM | POA: Diagnosis present

## 2017-10-30 DIAGNOSIS — Z96641 Presence of right artificial hip joint: Secondary | ICD-10-CM | POA: Diagnosis present

## 2017-10-30 DIAGNOSIS — I350 Nonrheumatic aortic (valve) stenosis: Secondary | ICD-10-CM | POA: Diagnosis present

## 2017-10-30 DIAGNOSIS — M25561 Pain in right knee: Secondary | ICD-10-CM | POA: Diagnosis not present

## 2017-10-30 DIAGNOSIS — M1711 Unilateral primary osteoarthritis, right knee: Secondary | ICD-10-CM | POA: Diagnosis present

## 2017-10-30 DIAGNOSIS — I2510 Atherosclerotic heart disease of native coronary artery without angina pectoris: Secondary | ICD-10-CM | POA: Diagnosis present

## 2017-10-30 DIAGNOSIS — M179 Osteoarthritis of knee, unspecified: Secondary | ICD-10-CM

## 2017-10-30 DIAGNOSIS — M1710 Unilateral primary osteoarthritis, unspecified knee: Secondary | ICD-10-CM

## 2017-10-30 HISTORY — PX: TOTAL KNEE ARTHROPLASTY: SHX125

## 2017-10-30 SURGERY — ARTHROPLASTY, KNEE, TOTAL
Anesthesia: Spinal | Site: Knee | Laterality: Right

## 2017-10-30 MED ORDER — ROPIVACAINE HCL 7.5 MG/ML IJ SOLN
INTRAMUSCULAR | Status: DC | PRN
  Administered 2017-10-30: 25 mL via PERINEURAL

## 2017-10-30 MED ORDER — PROPOFOL 10 MG/ML IV BOLUS
INTRAVENOUS | Status: AC
  Filled 2017-10-30: qty 20

## 2017-10-30 MED ORDER — PHENYLEPHRINE HCL 10 MG/ML IJ SOLN
INTRAVENOUS | Status: DC | PRN
  Administered 2017-10-30: 40 ug/min via INTRAVENOUS

## 2017-10-30 MED ORDER — DEXAMETHASONE SODIUM PHOSPHATE 10 MG/ML IJ SOLN
8.0000 mg | Freq: Once | INTRAMUSCULAR | Status: AC
  Administered 2017-10-30: 10 mg via INTRAVENOUS

## 2017-10-30 MED ORDER — ASPIRIN EC 325 MG PO TBEC
325.0000 mg | DELAYED_RELEASE_TABLET | Freq: Two times a day (BID) | ORAL | Status: DC
  Administered 2017-10-31 – 2017-11-01 (×3): 325 mg via ORAL
  Filled 2017-10-30 (×3): qty 1

## 2017-10-30 MED ORDER — PHENOL 1.4 % MT LIQD: 1.0000 | OROMUCOSAL | Status: DC | PRN

## 2017-10-30 MED ORDER — ACETAMINOPHEN 10 MG/ML IV SOLN
1000.0000 mg | Freq: Four times a day (QID) | INTRAVENOUS | Status: AC
  Administered 2017-10-30: 1000 mg via INTRAVENOUS
  Filled 2017-10-30 (×4): qty 100

## 2017-10-30 MED ORDER — LACTATED RINGERS IV SOLN
INTRAVENOUS | Status: DC
  Administered 2017-10-30 (×2): via INTRAVENOUS

## 2017-10-30 MED ORDER — BUPIVACAINE IN DEXTROSE 0.75-8.25 % IT SOLN
INTRATHECAL | Status: DC | PRN
  Administered 2017-10-30: 1.6 mL via INTRATHECAL

## 2017-10-30 MED ORDER — GABAPENTIN 300 MG PO CAPS
300.0000 mg | ORAL_CAPSULE | Freq: Three times a day (TID) | ORAL | Status: DC
  Administered 2017-10-30 – 2017-11-01 (×6): 300 mg via ORAL
  Filled 2017-10-30 (×6): qty 1

## 2017-10-30 MED ORDER — BENAZEPRIL HCL 10 MG PO TABS
20.0000 mg | ORAL_TABLET | Freq: Every day | ORAL | Status: DC
  Administered 2017-11-01: 20 mg via ORAL
  Filled 2017-10-30 (×2): qty 2

## 2017-10-30 MED ORDER — METOCLOPRAMIDE HCL 5 MG/ML IJ SOLN: 5.0000 mg | Freq: Three times a day (TID) | INTRAMUSCULAR | Status: DC | PRN

## 2017-10-30 MED ORDER — GLYCOPYRROLATE 0.2 MG/ML IV SOSY
PREFILLED_SYRINGE | INTRAVENOUS | Status: AC
  Filled 2017-10-30: qty 5

## 2017-10-30 MED ORDER — DEXAMETHASONE SODIUM PHOSPHATE 10 MG/ML IJ SOLN
INTRAMUSCULAR | Status: AC
  Filled 2017-10-30: qty 1

## 2017-10-30 MED ORDER — HYDROCHLOROTHIAZIDE 25 MG PO TABS
25.0000 mg | ORAL_TABLET | Freq: Every day | ORAL | Status: DC
  Administered 2017-11-01: 25 mg via ORAL
  Filled 2017-10-30 (×2): qty 1

## 2017-10-30 MED ORDER — PROPOFOL 500 MG/50ML IV EMUL
INTRAVENOUS | Status: DC | PRN
  Administered 2017-10-30: 50 ug/kg/min via INTRAVENOUS

## 2017-10-30 MED ORDER — BISACODYL 10 MG RE SUPP: 10.0000 mg | Freq: Every day | RECTAL | Status: DC | PRN

## 2017-10-30 MED ORDER — PHENYLEPHRINE HCL 10 MG/ML IJ SOLN
INTRAMUSCULAR | Status: AC
  Filled 2017-10-30: qty 1

## 2017-10-30 MED ORDER — MEPERIDINE HCL 50 MG/ML IJ SOLN: 6.2500 mg | INTRAMUSCULAR | Status: DC | PRN

## 2017-10-30 MED ORDER — POLYETHYLENE GLYCOL 3350 17 G PO PACK
17.0000 g | PACK | Freq: Every day | ORAL | Status: DC | PRN
  Filled 2017-10-30: qty 1

## 2017-10-30 MED ORDER — DIPHENHYDRAMINE HCL 12.5 MG/5ML PO ELIX: 12.5000 mg | ORAL_SOLUTION | ORAL | Status: DC | PRN

## 2017-10-30 MED ORDER — DOCUSATE SODIUM 100 MG PO CAPS
100.0000 mg | ORAL_CAPSULE | Freq: Two times a day (BID) | ORAL | Status: DC
  Administered 2017-10-30 – 2017-11-01 (×4): 100 mg via ORAL
  Filled 2017-10-30 (×4): qty 1

## 2017-10-30 MED ORDER — MENTHOL 3 MG MT LOZG: 1.0000 | LOZENGE | OROMUCOSAL | Status: DC | PRN

## 2017-10-30 MED ORDER — ONDANSETRON HCL 4 MG/2ML IJ SOLN
INTRAMUSCULAR | Status: AC
  Filled 2017-10-30: qty 2

## 2017-10-30 MED ORDER — TRAMADOL HCL 50 MG PO TABS: 50.0000 mg | ORAL_TABLET | Freq: Four times a day (QID) | ORAL | Status: DC | PRN

## 2017-10-30 MED ORDER — ONDANSETRON HCL 4 MG/2ML IJ SOLN
INTRAMUSCULAR | Status: DC | PRN
  Administered 2017-10-30: 4 mg via INTRAVENOUS

## 2017-10-30 MED ORDER — PROPOFOL 10 MG/ML IV BOLUS
INTRAVENOUS | Status: DC | PRN
  Administered 2017-10-30: 30 mg via INTRAVENOUS

## 2017-10-30 MED ORDER — SODIUM CHLORIDE 0.9 % IJ SOLN
INTRAMUSCULAR | Status: AC
  Filled 2017-10-30: qty 50

## 2017-10-30 MED ORDER — OXYCODONE HCL 5 MG PO TABS
5.0000 mg | ORAL_TABLET | ORAL | Status: DC | PRN
  Administered 2017-10-30 – 2017-11-01 (×9): 5 mg via ORAL
  Filled 2017-10-30 (×10): qty 1

## 2017-10-30 MED ORDER — CHLORHEXIDINE GLUCONATE 4 % EX LIQD: 60.0000 mL | Freq: Once | CUTANEOUS | Status: DC

## 2017-10-30 MED ORDER — BUPIVACAINE LIPOSOME 1.3 % IJ SUSP
INTRAMUSCULAR | Status: DC | PRN
  Administered 2017-10-30: 20 mL

## 2017-10-30 MED ORDER — METOCLOPRAMIDE HCL 5 MG PO TABS: 5.0000 mg | ORAL_TABLET | Freq: Three times a day (TID) | ORAL | Status: DC | PRN

## 2017-10-30 MED ORDER — ACETAMINOPHEN 500 MG PO TABS
1000.0000 mg | ORAL_TABLET | Freq: Four times a day (QID) | ORAL | Status: AC
  Administered 2017-10-30 – 2017-10-31 (×4): 1000 mg via ORAL
  Filled 2017-10-30 (×4): qty 2

## 2017-10-30 MED ORDER — SODIUM CHLORIDE 0.9 % IJ SOLN
INTRAMUSCULAR | Status: AC
  Filled 2017-10-30: qty 10

## 2017-10-30 MED ORDER — OXYCODONE HCL 5 MG PO TABS: 10.0000 mg | ORAL_TABLET | ORAL | Status: DC | PRN

## 2017-10-30 MED ORDER — CEFAZOLIN SODIUM-DEXTROSE 1-4 GM/50ML-% IV SOLN
1.0000 g | Freq: Four times a day (QID) | INTRAVENOUS | Status: AC
  Administered 2017-10-30 (×2): 1 g via INTRAVENOUS
  Filled 2017-10-30 (×2): qty 50

## 2017-10-30 MED ORDER — SODIUM CHLORIDE 0.9 % IR SOLN
Status: DC | PRN
  Administered 2017-10-30: 1000 mL

## 2017-10-30 MED ORDER — PROPOFOL 10 MG/ML IV BOLUS
INTRAVENOUS | Status: AC
  Filled 2017-10-30: qty 40

## 2017-10-30 MED ORDER — FENTANYL CITRATE (PF) 250 MCG/5ML IJ SOLN
INTRAMUSCULAR | Status: AC
  Filled 2017-10-30: qty 5

## 2017-10-30 MED ORDER — DEXTROSE 5 % IV SOLN
500.0000 mg | Freq: Four times a day (QID) | INTRAVENOUS | Status: DC | PRN
  Administered 2017-10-30: 500 mg via INTRAVENOUS
  Filled 2017-10-30: qty 550

## 2017-10-30 MED ORDER — CEFAZOLIN SODIUM-DEXTROSE 2-4 GM/100ML-% IV SOLN
2.0000 g | INTRAVENOUS | Status: AC
  Administered 2017-10-30: 2 g via INTRAVENOUS
  Filled 2017-10-30: qty 100

## 2017-10-30 MED ORDER — STERILE WATER FOR IRRIGATION IR SOLN
Status: DC | PRN
  Administered 2017-10-30: 2000 mL

## 2017-10-30 MED ORDER — HYDROMORPHONE HCL 1 MG/ML IJ SOLN: 0.5000 mg | INTRAMUSCULAR | Status: DC | PRN

## 2017-10-30 MED ORDER — DEXAMETHASONE SODIUM PHOSPHATE 10 MG/ML IJ SOLN
10.0000 mg | Freq: Once | INTRAMUSCULAR | Status: AC
  Administered 2017-10-31: 10 mg via INTRAVENOUS
  Filled 2017-10-30: qty 1

## 2017-10-30 MED ORDER — FENTANYL CITRATE (PF) 100 MCG/2ML IJ SOLN: 25.0000 ug | INTRAMUSCULAR | Status: DC | PRN

## 2017-10-30 MED ORDER — BENAZEPRIL-HYDROCHLOROTHIAZIDE 20-25 MG PO TABS: 1.0000 | ORAL_TABLET | Freq: Every day | ORAL | Status: DC

## 2017-10-30 MED ORDER — FENTANYL CITRATE (PF) 100 MCG/2ML IJ SOLN
INTRAMUSCULAR | Status: DC | PRN
  Administered 2017-10-30 (×2): 50 ug via INTRAVENOUS

## 2017-10-30 MED ORDER — ONDANSETRON HCL 4 MG PO TABS: 4.0000 mg | ORAL_TABLET | Freq: Four times a day (QID) | ORAL | Status: DC | PRN

## 2017-10-30 MED ORDER — METHOCARBAMOL 500 MG PO TABS
500.0000 mg | ORAL_TABLET | Freq: Four times a day (QID) | ORAL | Status: DC | PRN
  Administered 2017-10-30 – 2017-11-01 (×4): 500 mg via ORAL
  Filled 2017-10-30 (×6): qty 1

## 2017-10-30 MED ORDER — GLYCOPYRROLATE 0.2 MG/ML IJ SOLN
INTRAMUSCULAR | Status: DC | PRN
  Administered 2017-10-30: 0.2 mg via INTRAVENOUS

## 2017-10-30 MED ORDER — FLEET ENEMA 7-19 GM/118ML RE ENEM: 1.0000 | ENEMA | Freq: Once | RECTAL | Status: DC | PRN

## 2017-10-30 MED ORDER — SODIUM CHLORIDE 0.9 % IJ SOLN
INTRAMUSCULAR | Status: DC | PRN
  Administered 2017-10-30: 60 mL

## 2017-10-30 MED ORDER — SODIUM CHLORIDE 0.9 % IV SOLN
INTRAVENOUS | Status: DC
  Administered 2017-10-30 – 2017-10-31 (×2): via INTRAVENOUS

## 2017-10-30 MED ORDER — ONDANSETRON HCL 4 MG/2ML IJ SOLN: 4.0000 mg | Freq: Four times a day (QID) | INTRAMUSCULAR | Status: DC | PRN

## 2017-10-30 MED ORDER — NITROGLYCERIN 0.4 MG SL SUBL: 0.4000 mg | SUBLINGUAL_TABLET | SUBLINGUAL | Status: DC | PRN

## 2017-10-30 SURGICAL SUPPLY — 53 items
BAG DECANTER FOR FLEXI CONT (MISCELLANEOUS) ×2 IMPLANT
BAG ZIPLOCK 12X15 (MISCELLANEOUS) ×2 IMPLANT
BANDAGE ACE 6X5 VEL STRL LF (GAUZE/BANDAGES/DRESSINGS) ×2 IMPLANT
BLADE SAG 18X100X1.27 (BLADE) ×2 IMPLANT
BLADE SAW SGTL 11.0X1.19X90.0M (BLADE) ×2 IMPLANT
BOWL SMART MIX CTS (DISPOSABLE) ×2 IMPLANT
CAP KNEE TOTAL 3 SIGMA ×2 IMPLANT
CEMENT HV SMART SET (Cement) ×4 IMPLANT
COVER SURGICAL LIGHT HANDLE (MISCELLANEOUS) ×2 IMPLANT
CUFF TOURN SGL QUICK 34 (TOURNIQUET CUFF) ×1
CUFF TRNQT CYL 34X4X40X1 (TOURNIQUET CUFF) ×1 IMPLANT
DECANTER SPIKE VIAL GLASS SM (MISCELLANEOUS) ×4 IMPLANT
DRAPE U-SHAPE 47X51 STRL (DRAPES) ×2 IMPLANT
DRSG ADAPTIC 3X8 NADH LF (GAUZE/BANDAGES/DRESSINGS) ×2 IMPLANT
DRSG PAD ABDOMINAL 8X10 ST (GAUZE/BANDAGES/DRESSINGS) ×2 IMPLANT
DURAPREP 26ML APPLICATOR (WOUND CARE) ×2 IMPLANT
ELECT REM PT RETURN 15FT ADLT (MISCELLANEOUS) ×2 IMPLANT
EVACUATOR 1/8 PVC DRAIN (DRAIN) ×2 IMPLANT
GAUZE SPONGE 4X4 12PLY STRL (GAUZE/BANDAGES/DRESSINGS) ×2 IMPLANT
GLOVE BIO SURGEON STRL SZ 6.5 (GLOVE) IMPLANT
GLOVE BIO SURGEON STRL SZ8 (GLOVE) ×4 IMPLANT
GLOVE BIOGEL PI IND STRL 6.5 (GLOVE) ×1 IMPLANT
GLOVE BIOGEL PI IND STRL 7.0 (GLOVE) ×4 IMPLANT
GLOVE BIOGEL PI IND STRL 8 (GLOVE) ×2 IMPLANT
GLOVE BIOGEL PI INDICATOR 6.5 (GLOVE) ×1
GLOVE BIOGEL PI INDICATOR 7.0 (GLOVE) ×4
GLOVE BIOGEL PI INDICATOR 8 (GLOVE) ×2
GLOVE ECLIPSE 7.0 STRL STRAW (GLOVE) ×2 IMPLANT
GLOVE SURG ORTHO 8.0 STRL STRW (GLOVE) ×2 IMPLANT
GOWN STRL REUS W/TWL LRG LVL3 (GOWN DISPOSABLE) ×4 IMPLANT
GOWN STRL REUS W/TWL XL LVL3 (GOWN DISPOSABLE) ×4 IMPLANT
HANDPIECE INTERPULSE COAX TIP (DISPOSABLE) ×1
HOLDER FOLEY CATH W/STRAP (MISCELLANEOUS) ×2 IMPLANT
IMMOBILIZER KNEE 20 (SOFTGOODS) ×2
IMMOBILIZER KNEE 20 THIGH 36 (SOFTGOODS) ×1 IMPLANT
MANIFOLD NEPTUNE II (INSTRUMENTS) ×2 IMPLANT
NS IRRIG 1000ML POUR BTL (IV SOLUTION) ×2 IMPLANT
PACK TOTAL KNEE CUSTOM (KITS) ×2 IMPLANT
PAD ABD 8X10 STRL (GAUZE/BANDAGES/DRESSINGS) ×2 IMPLANT
PADDING CAST COTTON 6X4 STRL (CAST SUPPLIES) ×2 IMPLANT
POSITIONER SURGICAL ARM (MISCELLANEOUS) ×2 IMPLANT
SET HNDPC FAN SPRY TIP SCT (DISPOSABLE) ×1 IMPLANT
STRIP CLOSURE SKIN 1/2X4 (GAUZE/BANDAGES/DRESSINGS) ×2 IMPLANT
SUT MNCRL AB 4-0 PS2 18 (SUTURE) ×2 IMPLANT
SUT STRATAFIX 0 PDS 27 VIOLET (SUTURE) ×4
SUT VIC AB 2-0 CT1 27 (SUTURE) ×3
SUT VIC AB 2-0 CT1 TAPERPNT 27 (SUTURE) ×3 IMPLANT
SUTURE STRATFX 0 PDS 27 VIOLET (SUTURE) ×2 IMPLANT
SYR 50ML LL SCALE MARK (SYRINGE) ×2 IMPLANT
TRAY FOLEY MTR SLVR 16FR STAT (SET/KITS/TRAYS/PACK) ×2 IMPLANT
WATER STERILE IRR 1000ML POUR (IV SOLUTION) ×4 IMPLANT
WRAP KNEE MAXI GEL POST OP (GAUZE/BANDAGES/DRESSINGS) ×2 IMPLANT
YANKAUER SUCT BULB TIP 10FT TU (MISCELLANEOUS) ×2 IMPLANT

## 2017-10-30 NOTE — Transfer of Care (Signed)
Immediate Anesthesia Transfer of Care Note  Patient: [name] [name]  Procedure(s) Performed: Procedure(s): RIGHT TOTAL KNEE ARTHROPLASTY (Right)  Patient Location: PACU  Anesthesia Type:Regional and Spinal  Level of Consciousness:  sedated, patient cooperative and responds to stimulation  Airway & Oxygen Therapy:Patient Spontanous Breathing and Patient connected to face mask oxgen  Post-op Assessment:  Report given to PACU RN and Post -op Vital signs reviewed and stable  Post vital signs:  Reviewed and stable  Last Vitals:  Vitals:   10/30/17 0553  BP: (!) 135/93  Pulse: 70  Resp: 18  Temp: 36.6 C  SpO2: 643%    Complications: No apparent anesthesia complications

## 2017-10-30 NOTE — Op Note (Signed)
OPERATIVE REPORT-TOTAL KNEE ARTHROPLASTY   Pre-operative diagnosis- Osteoarthritis  Right knee(s)  Post-operative diagnosis- Osteoarthritis Right knee(s)  Procedure-  Right  Total Knee Arthroplasty  Surgeon- Dione Plover. Lexia Vandevender, MD  Assistant- Molli Barrows, PA-C   Anesthesia-  Adductor canal block and spinal  EBL-50 mL   Drains Hemovac  Tourniquet time-  Total Tourniquet Time Documented: Thigh (Right) - 35 minutes Total: Thigh (Right) - 35 minutes     Complications- None  Condition-PACU - hemodynamically stable.   Brief Clinical Note  [name] [name] is a 82 y.o. year old male with end stage OA of his right knee with progressively worsening pain and dysfunction. He has constant pain, with activity and at rest and significant functional deficits with difficulties even with ADLs. He has had extensive non-op management including analgesics, injections of cortisone and viscosupplements, and home exercise program, but remains in significant pain with significant dysfunction. Radiographs show bone on bone arthritis lateral and patellofemoral with valgus deformity. He presents now for right Total Knee Arthroplasty.    Procedure in detail---   The patient is brought into the operating room and positioned supine on the operating table. After successful administration of  Adductor canal block and spinal,   a tourniquet is placed high on the  Right thigh(s) and the lower extremity is prepped and draped in the usual sterile fashion. Time out is performed by the operating team and then the  Right lower extremity is wrapped in Esmarch, knee flexed and the tourniquet inflated to 300 mmHg.       A midline incision is made with a ten blade through the subcutaneous tissue to the level of the extensor mechanism. A fresh blade is used to make a medial parapatellar arthrotomy. Soft tissue over the proximal medial tibia is subperiosteally elevated to the joint line with a knife and into the  semimembranosus bursa with a Cobb elevator. Soft tissue over the proximal lateral tibia is elevated with attention being paid to avoiding the patellar tendon on the tibial tubercle. The patella is everted, knee flexed 90 degrees and the ACL and PCL are removed. Findings are bone on bone lateral and patellofemoral with lage global osteophytes.        The drill is used to create a starting hole in the distal femur and the canal is thoroughly irrigated with sterile saline to remove the fatty contents. The 5 degree Right  valgus alignment guide is placed into the femoral canal and the distal femoral cutting block is pinned to remove 10 mm off the distal femur. Resection is made with an oscillating saw.      The tibia is subluxed forward and the menisci are removed. The extramedullary alignment guide is placed referencing proximally at the medial aspect of the tibial tubercle and distally along the second metatarsal axis and tibial crest. The block is pinned to remove 65mm off the more deficient lateral  side. Resection is made with an oscillating saw. Size 3is the most appropriate size for the tibia and the proximal tibia is prepared with the modular drill and keel punch for that size.      The femoral sizing guide is placed and size 4 is most appropriate. Rotation is marked off the epicondylar axis and confirmed by creating a rectangular flexion gap at 90 degrees. The size 4 cutting block is pinned in this rotation and the anterior, posterior and chamfer cuts are made with the oscillating saw. The intercondylar block is then placed and that cut  is made.      Trial size 3 tibial component, trial size 4 posterior stabilized femur and a 12.5  mm posterior stabilized rotating platform insert trial is placed. Full extension is achieved with excellent varus/valgus and anterior/posterior balance throughout full range of motion. The patella is everted and thickness measured to be 22  mm. Free hand resection is taken to 12  mm, a 35 template is placed, lug holes are drilled, trial patella is placed, and it tracks normally. Osteophytes are removed off the posterior femur with the trial in place. All trials are removed and the cut bone surfaces prepared with pulsatile lavage. Cement is mixed and once ready for implantation, the size 3 tibial implant, size  4 posterior stabilized femoral component, and the size 35 patella are cemented in place and the patella is held with the clamp. The trial insert is placed and the knee held in full extension. The Exparel (20 ml mixed with 60 ml saline) is injected into the extensor mechanism, posterior capsule, medial and lateral gutters and subcutaneous tissues.  All extruded cement is removed and once the cement is hard the permanent 12.5 mm posterior stabilized rotating platform insert is placed into the tibial tray.      The wound is copiously irrigated with saline solution and the extensor mechanism closed over a hemovac drain with #1 V-loc suture. The tourniquet is released for a total tourniquet time of 35  minutes. Flexion against gravity is 140 degrees and the patella tracks normally. Subcutaneous tissue is closed with 2.0 vicryl and subcuticular with running 4.0 Monocryl. The incision is cleaned and dried and steri-strips and a bulky sterile dressing are applied. The limb is placed into a knee immobilizer and the patient is awakened and transported to recovery in stable condition.      Please note that a surgical assistant was a medical necessity for this procedure in order to perform it in a safe and expeditious manner. Surgical assistant was necessary to retract the ligaments and vital neurovascular structures to prevent injury to them and also necessary for proper positioning of the limb to allow for anatomic placement of the prosthesis.   Dione Plover Icholas Irby, MD    10/30/2017, 8:14 AM

## 2017-10-30 NOTE — Anesthesia Procedure Notes (Signed)
Spinal  Patient location during procedure: OR Start time: 10/30/2017 7:12 AM End time: 10/30/2017 7:17 AM Reason for block: at surgeon's request Staffing Resident/CRNA: Anne Fu, CRNA Performed: resident/CRNA  Preanesthetic Checklist Completed: patient identified, site marked, surgical consent, pre-op evaluation, timeout performed, IV checked, risks and benefits discussed and monitors and equipment checked Spinal Block Patient position: sitting Prep: DuraPrep Patient monitoring: heart rate, continuous pulse ox and blood pressure Approach: right paramedian Location: L2-3 Injection technique: single-shot Needle Needle type: Pencan  Needle gauge: 24 G Needle length: 9 cm Assessment Sensory level: T6 Additional Notes Expiration date of kit checked and confirmed. Patient tolerated procedure well, without complications. X 1 attempt with noted clear CSF return. Loss of motor and sensory on exam post injection.

## 2017-10-30 NOTE — Anesthesia Procedure Notes (Addendum)
Anesthesia Regional Block: Adductor canal block   Pre-Anesthetic Checklist: ,, timeout performed, Correct Patient, Correct Site, Correct Laterality, Correct Procedure, Correct Position, site marked, Risks and benefits discussed,  Surgical consent,  Pre-op evaluation,  At surgeon's request and post-op pain management  Laterality: Right  Prep: chloraprep       Needles:  Injection technique: Single-shot  Needle Type: Echogenic Stimulator Needle     Needle Length: 5cm  Needle Gauge: 22     Additional Needles:   Procedures:, nerve stimulator,,, ultrasound used (permanent image in chart),,,,  Narrative:  Start time: 10/30/2017 6:50 AM End time: 10/30/2017 6:55 AM Injection made incrementally with aspirations every 5 mL.  Performed by: Personally  Anesthesiologist: Janeece Riggers, MD  Additional Notes: Functioning IV was confirmed and monitors were applied.  A 14mm 22ga Arrow echogenic stimulator needle was used. Sterile prep and drape,hand hygiene and sterile gloves were used. Ultrasound guidance: relevant anatomy identified, needle position confirmed, local anesthetic spread visualized around nerve(s)., vascular puncture avoided.  Image printed for medical record. Negative aspiration and negative test dose prior to incremental administration of local anesthetic. The patient tolerated the procedure well.

## 2017-10-30 NOTE — Evaluation (Signed)
Physical Therapy Evaluation Patient Details Name: [name] [name] MRN: [id number] DOB: 19-Nov-1935 Today's Date: 10/30/2017   History of Present Illness  R TKA, h/o R DA-THA 2014  Clinical Impression  Pt is s/p TKA resulting in the deficits listed below (see PT Problem List). Pt ambulated 15' with RW with min/guard assist, no loss of balance. Initiated HEP. Good progress expected, pt puts forth good effort.  Pt will benefit from skilled PT to increase their independence and safety with mobility to allow discharge to the venue listed below.      Follow Up Recommendations Outpatient PT;Follow surgeon's recommendation for DC plan and follow-up therapies(outpt appointment THURSDAY)    Equipment Recommendations  3in1 (PT)    Recommendations for Other Services       Precautions / Restrictions Precautions Precautions: Knee Precaution Booklet Issued: Yes (comment) Precaution Comments: reviewed no pillow under knee      Mobility  Bed Mobility Overal bed mobility: Needs Assistance Bed Mobility: Supine to Sit     Supine to sit: Min assist     General bed mobility comments: min A to support RLE and pivot hips with pad  Transfers Overall transfer level: Needs assistance Equipment used: Rolling walker (2 wheeled) Transfers: Sit to/from Stand Sit to Stand: Min assist         General transfer comment: VCs hand placement, min A to rise  Ambulation/Gait Ambulation/Gait assistance: Herbalist (Feet): 15 Feet Assistive device: Rolling walker (2 wheeled) Gait Pattern/deviations: Step-to pattern;Decreased step length - right;Decreased step length - left     General Gait Details: VCs sequencing  Stairs            Wheelchair Mobility    Modified Rankin (Stroke Patients Only)       Balance Overall balance assessment: Modified Independent                                           Pertinent Vitals/Pain Pain Assessment: 0-10 Pain  Score: 4  Pain Location: R knee Pain Descriptors / Indicators: Sore Pain Intervention(s): Limited activity within patient's tolerance;Monitored during session;Premedicated before session;Ice applied    Home Living Family/patient expects to be discharged to:: Private residence Living Arrangements: Spouse/significant other Available Help at Discharge: Family;Available 24 hours/day   Home Access: Stairs to enter   Entrance Stairs-Number of Steps: 1 Home Layout: Two level;Full bath on main level Home Equipment: Walker - 2 wheels;Cane - single point;Shower seat      Prior Function Level of Independence: Independent with assistive device(s)         Comments: used cane PTA     Hand Dominance        Extremity/Trunk Assessment   Upper Extremity Assessment Upper Extremity Assessment: Overall WFL for tasks assessed    Lower Extremity Assessment Lower Extremity Assessment: RLE deficits/detail RLE Deficits / Details: 3/5 SLR, 10-45* R knee AAROM RLE Sensation: WNL    Cervical / Trunk Assessment Cervical / Trunk Assessment: Normal  Communication   Communication: No difficulties  Cognition Arousal/Alertness: Awake/alert Behavior During Therapy: WFL for tasks assessed/performed Overall Cognitive Status: Within Functional Limits for tasks assessed                                        General Comments  Exercises Total Joint Exercises Ankle Circles/Pumps: AROM;Both;10 reps;Supine Quad Sets: AROM;Right;5 reps;Supine Heel Slides: AAROM;Right;10 reps;Supine Long Arc Quad: AROM;5 reps;Right Goniometric ROM: 10-45* AAROM R knee   Assessment/Plan    PT Assessment Patient needs continued PT services  PT Problem List Decreased strength;Decreased range of motion;Decreased activity tolerance;Decreased mobility;Pain       PT Treatment Interventions DME instruction;Gait training;Functional mobility training;Therapeutic activities;Therapeutic exercise;Stair  training;Balance training;Patient/family education    PT Goals (Current goals can be found in the Care Plan section)  Acute Rehab PT Goals Patient Stated Goal: play golf PT Goal Formulation: With patient Time For Goal Achievement: 11/06/17 Potential to Achieve Goals: Good    Frequency 7X/week   Barriers to discharge        Co-evaluation               AM-PAC PT "6 Clicks" Daily Activity  Outcome Measure Difficulty turning over in bed (including adjusting bedclothes, sheets and blankets)?: A Little Difficulty moving from lying on back to sitting on the side of the bed? : A Little Difficulty sitting down on and standing up from a chair with arms (e.g., wheelchair, bedside commode, etc,.)?: Unable Help needed moving to and from a bed to chair (including a wheelchair)?: A Little Help needed walking in hospital room?: A Little Help needed climbing 3-5 steps with a railing? : A Lot 6 Click Score: 15    End of Session Equipment Utilized During Treatment: Gait belt Activity Tolerance: Patient tolerated treatment well Patient left: in chair;with call bell/phone within reach;with chair alarm set Nurse Communication: Mobility status PT Visit Diagnosis: Pain;Muscle weakness (generalized) (M62.81);Difficulty in walking, not elsewhere classified (R26.2) Pain - Right/Left: Right Pain - part of body: Knee    Time: 1324-4010 PT Time Calculation (min) (ACUTE ONLY): 25 min   Charges:   PT Evaluation $PT Eval Low Complexity: 1 Low PT Treatments $Gait Training: 8-22 mins   PT G Codes:          Philomena Doheny 10/30/2017, 2:10 PM [phone]

## 2017-10-30 NOTE — Discharge Instructions (Signed)
° °Dr. Frank Aluisio °Total Joint Specialist °[facility] °[address] °[place], [place] [address] °[phone] ° °TOTAL KNEE REPLACEMENT POSTOPERATIVE DIRECTIONS ° °Knee Rehabilitation, Guidelines Following Surgery  °Results after knee surgery are often greatly improved when you follow the exercise, range of motion and muscle strengthening exercises prescribed by your doctor. Safety measures are also important to protect the knee from further injury. Any time any of these exercises cause you to have increased pain or swelling in your knee joint, decrease the amount until you are comfortable again and slowly increase them. If you have problems or questions, call your caregiver or physical therapist for advice.  ° °HOME CARE INSTRUCTIONS  °• Remove items at home which could result in a fall. This includes throw rugs or furniture in walking pathways.  °· ICE to the affected knee every three hours for 30 minutes at a time and then as needed for pain and swelling.  Continue to use ice on the knee for pain and swelling from surgery. You may notice swelling that will progress down to the foot and ankle.  This is normal after surgery.  Elevate the leg when you are not up walking on it.   °· Continue to use the breathing machine which will help keep your temperature down.  It is common for your temperature to cycle up and down following surgery, especially at night when you are not up moving around and exerting yourself.  The breathing machine keeps your lungs expanded and your temperature down. °· Do not place pillow under knee, focus on keeping the knee straight while resting ° °DIET °You may resume your previous home diet once your are discharged from the hospital. ° °DRESSING / WOUND CARE / SHOWERING °You may shower 3 days after surgery, but keep the wounds dry during showering.  You may use an occlusive plastic wrap (Press'n Seal for example), NO SOAKING/SUBMERGING IN THE BATHTUB.  If the bandage  gets wet, change with a clean dry gauze.  If the incision gets wet, pat the wound dry with a clean towel. °You may start showering once you are discharged home but do not submerge the incision under water. Just pat the incision dry and apply a dry gauze dressing on daily. °Change the surgical dressing daily and reapply a dry dressing each time. ° °ACTIVITY °Walk with your walker as instructed. °Use walker as long as suggested by your caregivers. °Avoid periods of inactivity such as sitting longer than an hour when not asleep. This helps prevent blood clots.  °You may resume a sexual relationship in one month or when given the OK by your doctor.  °You may return to work once you are cleared by your doctor.  °Do not drive a car for 6 weeks or until released by you surgeon.  °Do not drive while taking narcotics. ° °WEIGHT BEARING °Weight bearing as tolerated with assist device (walker, cane, etc) as directed, use it as long as suggested by your surgeon or therapist, typically at least 4-6 weeks. ° °POSTOPERATIVE CONSTIPATION PROTOCOL °Constipation - defined medically as fewer than three stools per week and severe constipation as less than one stool per week. ° °One of the most common issues patients have following surgery is constipation.  Even if you have a regular bowel pattern at home, your normal regimen is likely to be disrupted due to multiple reasons following surgery.  Combination of anesthesia, postoperative narcotics, change in appetite and fluid intake all can affect your bowels.    In order to avoid complications following surgery, here are some recommendations in order to help you during your recovery period. ° °Colace (docusate) - Pick up an over-the-counter form of Colace or another stool softener and take twice a day as long as you are requiring postoperative pain medications.  Take with a full glass of water daily.  If you experience loose stools or diarrhea, hold the colace until you stool forms back  up.  If your symptoms do not get better within 1 week or if they get worse, check with your doctor. ° °Dulcolax (bisacodyl) - Pick up over-the-counter and take as directed by the product packaging as needed to assist with the movement of your bowels.  Take with a full glass of water.  Use this product as needed if not relieved by Colace only.  ° °MiraLax (polyethylene glycol) - Pick up over-the-counter to have on hand.  MiraLax is a solution that will increase the amount of water in your bowels to assist with bowel movements.  Take as directed and can mix with a glass of water, juice, soda, coffee, or tea.  Take if you go more than two days without a movement. °Do not use MiraLax more than once per day. Call your doctor if you are still constipated or irregular after using this medication for 7 days in a row. ° °If you continue to have problems with postoperative constipation, please contact the office for further assistance and recommendations.  If you experience "the worst abdominal pain ever" or develop nausea or vomiting, please contact the office immediatly for further recommendations for treatment. ° °ITCHING ° If you experience itching with your medications, try taking only a single pain pill, or even half a pain pill at a time.  You can also use Benadryl over the counter for itching or also to help with sleep.  ° °TED HOSE STOCKINGS °Wear the elastic stockings on both legs for three weeks following surgery during the day but you may remove then at night for sleeping. ° °MEDICATIONS °See your medication summary on the “After Visit Summary” that the nursing staff will review with you prior to discharge.  You may have some home medications which will be placed on hold until you complete the course of blood thinner medication.  It is important for you to complete the blood thinner medication as prescribed by your surgeon.  Continue your approved medications as instructed at time of discharge. ° °PRECAUTIONS °If  you experience chest pain or shortness of breath - call 911 immediately for transfer to the hospital emergency department.  °If you develop a fever greater that 101 F, purulent drainage from wound, increased redness or drainage from wound, foul odor from the wound/dressing, or calf pain - CONTACT YOUR SURGEON.   °                                                °FOLLOW-UP APPOINTMENTS °Make sure you keep all of your appointments after your operation with your surgeon and caregivers. You should call the office at the above phone number and make an appointment for approximately two weeks after the date of your surgery or on the date instructed by your surgeon outlined in the "After Visit Summary". ° ° °RANGE OF MOTION AND STRENGTHENING EXERCISES  °Rehabilitation of the knee is important following a knee injury or   an operation. After just a few days of immobilization, the muscles of the thigh which control the knee become weakened and shrink (atrophy). Knee exercises are designed to build up the tone and strength of the thigh muscles and to improve knee motion. Often times heat used for twenty to thirty minutes before working out will loosen up your tissues and help with improving the range of motion but do not use heat for the first two weeks following surgery. These exercises can be done on a training (exercise) mat, on the floor, on a table or on a bed. Use what ever works the best and is most comfortable for you Knee exercises include:  °• Leg Lifts - While your knee is still immobilized in a splint or cast, you can do straight leg raises. Lift the leg to 60 degrees, hold for 3 sec, and slowly lower the leg. Repeat 10-20 times 2-3 times daily. Perform this exercise against resistance later as your knee gets better.  °• Quad and Hamstring Sets - Tighten up the muscle on the front of the thigh (Quad) and hold for 5-10 sec. Repeat this 10-20 times hourly. Hamstring sets are done by pushing the foot backward against an  object and holding for 5-10 sec. Repeat as with quad sets.  °· Leg Slides: Lying on your back, slowly slide your foot toward your buttocks, bending your knee up off the floor (only go as far as is comfortable). Then slowly slide your foot back down until your leg is flat on the floor again. °· Angel Wings: Lying on your back spread your legs to the side as far apart as you can without causing discomfort.  °A rehabilitation program following serious knee injuries can speed recovery and prevent re-injury in the future due to weakened muscles. Contact your doctor or a physical therapist for more information on knee rehabilitation.  ° °IF YOU ARE TRANSFERRED TO A SKILLED REHAB FACILITY °If the patient is transferred to a skilled rehab facility following release from the hospital, a list of the current medications will be sent to the facility for the patient to continue.  When discharged from the skilled rehab facility, please have the facility set up the patient's Home Health Physical Therapy prior to being released. Also, the skilled facility will be responsible for providing the patient with their medications at time of release from the facility to include their pain medication, the muscle relaxants, and their blood thinner medication. If the patient is still at the rehab facility at time of the two week follow up appointment, the skilled rehab facility will also need to assist the patient in arranging follow up appointment in our office and any transportation needs. ° °MAKE SURE YOU:  °• Understand these instructions.  °• Get help right away if you are not doing well or get worse.  ° ° °Pick up stool softner and laxative for home use following surgery while on pain medications. °Do not submerge incision under water. °Please use good hand washing techniques while changing dressing each day. °May shower starting three days after surgery. °Please use a clean towel to pat the incision dry following showers. °Continue to  use ice for pain and swelling after surgery. °Do not use any lotions or creams on the incision until instructed by your surgeon. ° °

## 2017-10-30 NOTE — Plan of Care (Signed)
  Problem: Health Behavior/Discharge Planning: Goal: Ability to manage health-related needs will improve Outcome: Progressing   Problem: Clinical Measurements: Goal: Respiratory complications will improve Outcome: Progressing   Problem: Activity: Goal: Risk for activity intolerance will decrease Outcome: Progressing   Problem: Nutrition: Goal: Adequate nutrition will be maintained Outcome: Progressing

## 2017-10-30 NOTE — Anesthesia Postprocedure Evaluation (Signed)
Anesthesia Post Note  Patient: Ammiel Guiney Charley  Procedure(s) Performed: RIGHT TOTAL KNEE ARTHROPLASTY (Right Knee)     Patient location during evaluation: PACU Anesthesia Type: Spinal Level of consciousness: oriented and awake and alert Pain management: pain level controlled Vital Signs Assessment: post-procedure vital signs reviewed and stable Respiratory status: spontaneous breathing, respiratory function stable and patient connected to nasal cannula oxygen Cardiovascular status: blood pressure returned to baseline and stable Postop Assessment: no headache, no backache and no apparent nausea or vomiting Anesthetic complications: no    Last Vitals:  Vitals:   10/30/17 0553 10/30/17 0842  BP: (!) 135/93   Pulse: 70   Resp: 18 (P) 16  Temp: 36.6 C (!) (P) 36.3 C  SpO2: 100%     Last Pain:  Vitals:   10/30/17 0553  TempSrc: Oral  PainSc:                  Wm Sahagun

## 2017-10-30 NOTE — Addendum Note (Signed)
Addendum  created 10/30/17 0956 by Anne Fu, CRNA   Child order released for a procedure order, Intraprocedure Blocks edited, Sign clinical note

## 2017-10-30 NOTE — Interval H&P Note (Signed)
History and Physical Interval Note:  10/30/2017 6:26 AM  [name] [name]  has presented today for surgery, with the diagnosis of Right knee osteoarthritis  The various methods of treatment have been discussed with the patient and family. After consideration of risks, benefits and other options for treatment, the patient has consented to  Procedure(s): RIGHT TOTAL KNEE ARTHROPLASTY (Right) as a surgical intervention .  The patient's history has been reviewed, patient examined, no change in status, stable for surgery.  I have reviewed the patient's chart and labs.  Questions were answered to the patient's satisfaction.     Pilar Plate Takara Sermons

## 2017-10-31 LAB — CBC
HCT: 38.9 % — ABNORMAL LOW (ref 39.0–52.0)
Hemoglobin: 13.4 g/dL (ref 13.0–17.0)
MCH: 33.3 pg (ref 26.0–34.0)
MCHC: 34.4 g/dL (ref 30.0–36.0)
MCV: 96.8 fL (ref 78.0–100.0)
PLATELETS: 325 10*3/uL (ref 150–400)
RBC: 4.02 MIL/uL — AB (ref 4.22–5.81)
RDW: 13.3 % (ref 11.5–15.5)
WBC: 14.1 10*3/uL — ABNORMAL HIGH (ref 4.0–10.5)

## 2017-10-31 LAB — BASIC METABOLIC PANEL
Anion gap: 7 (ref 5–15)
BUN: 17 mg/dL (ref 6–20)
CALCIUM: 8.6 mg/dL — AB (ref 8.9–10.3)
CO2: 26 mmol/L (ref 22–32)
CREATININE: 0.88 mg/dL (ref 0.61–1.24)
Chloride: 105 mmol/L (ref 101–111)
GFR calc Af Amer: >60 mL/min (ref >60)
Glucose, Bld: 120 mg/dL — ABNORMAL HIGH (ref 65–99)
POTASSIUM: 4.1 mmol/L (ref 3.5–5.1)
SODIUM: 138 mmol/L (ref 135–145)

## 2017-10-31 NOTE — Plan of Care (Signed)

## 2017-10-31 NOTE — Progress Notes (Signed)
Spoke with patient and spouse at bedside. Confirmed plan for OP PT, already arranged. Has RW and 3n1. [phone] 

## 2017-10-31 NOTE — Progress Notes (Signed)
Physical Therapy Treatment Patient Details Name: [name] [name] MRN: [id number] DOB: 02/12/1936 Today's Date: 10/31/2017    History of Present Illness R TKA, h/o R DA-THA 2014    PT Comments    POD # 1 pm session Assisted with amb a greater distance then returned to bed to perform some TKR TE's following HEP handout.  Followed by ICE.  Pt is progressing well and plans to D/C to home tomorrow.    Follow Up Recommendations  Outpatient PT;Follow surgeon's recommendation for DC plan and follow-up therapies     Equipment Recommendations  3in1 (PT)    Recommendations for Other Services       Precautions / Restrictions Precautions Precautions: Knee Precaution Comments: reviewed no pillow under knee Restrictions Weight Bearing Restrictions: No Other Position/Activity Restrictions: WBAT    Mobility  Bed Mobility Overal bed mobility: Needs Assistance Bed Mobility: Sit to Supine     Supine to sit: Min assist Sit to supine: Min guard;Min assist   General bed mobility comments: min A to support RLE back onto bed  Transfers Overall transfer level: Needs assistance Equipment used: Rolling walker (2 wheeled) Transfers: Sit to/from Stand Sit to Stand: Min assist         General transfer comment: VCs hand placement, min A to rise  Ambulation/Gait Ambulation/Gait assistance: Herbalist (Feet): 75 Feet Assistive device: Rolling walker (2 wheeled) Gait Pattern/deviations: Step-to pattern;Decreased step length - right;Decreased step length - left Gait velocity: decreased   General Gait Details: 25% VC's on proper walker to self distance and safety with turns    Marine scientist Rankin (Stroke Patients Only)       Balance                                            Cognition Arousal/Alertness: Awake/alert   Overall Cognitive Status: Within Functional Limits for tasks assessed                                         Exercises   Total Knee Replacement TE's 10 reps B LE ankle pumps 10 reps towel squeezes 10 reps knee presses 10 reps heel slides  Followed by ICE     General Comments        Pertinent Vitals/Pain Pain Assessment: 0-10 Pain Score: 3  Pain Location: R knee Pain Descriptors / Indicators: Sore Pain Intervention(s): Monitored during session;Repositioned;Premedicated before session;Ice applied    Home Living                      Prior Function            PT Goals (current goals can now be found in the care plan section) Progress towards PT goals: Progressing toward goals    Frequency    7X/week      PT Plan Current plan remains appropriate    Co-evaluation              AM-PAC PT "6 Clicks" Daily Activity  Outcome Measure  Difficulty turning over in bed (including adjusting bedclothes, sheets and blankets)?: A Little Difficulty moving from lying on back to sitting on the side of  the bed? : A Little Difficulty sitting down on and standing up from a chair with arms (e.g., wheelchair, bedside commode, etc,.)?: Unable Help needed moving to and from a bed to chair (including a wheelchair)?: A Little Help needed walking in hospital room?: A Little Help needed climbing 3-5 steps with a railing? : A Lot 6 Click Score: 15    End of Session Equipment Utilized During Treatment: Gait belt Activity Tolerance: Patient tolerated treatment well Patient left: in chair;with call bell/phone within reach;with chair alarm set Nurse Communication: Mobility status PT Visit Diagnosis: Pain;Muscle weakness (generalized) (M62.81);Difficulty in walking, not elsewhere classified (R26.2) Pain - Right/Left: Right Pain - part of body: Knee     Time: 1345-1410 PT Time Calculation (min) (ACUTE ONLY): 25 min  Charges:  $Gait Training: 8-22 mins $Therapeutic Exercise: 8-22 mins                    G Codes:       Rica Koyanagi   PTA [facility]  Acute  Rehab Pager      [phone]

## 2017-10-31 NOTE — Progress Notes (Signed)
   Subjective: 1 Day Post-Op Procedure(s) (LRB): RIGHT TOTAL KNEE ARTHROPLASTY (Right) Patient reports pain as mild.   We will start therapy today.  Plan is to go Home after hospital stay.  Objective: Vital signs in last 24 hours: Temp:  [97.4 F (36.3 C)-98.4 F (36.9 C)] 97.9 F (36.6 C) (06/18 0933) Pulse Rate:  [66-78] 66 (06/18 0933) Resp:  [14-18] 16 (06/18 0933) BP: (105-136)/(61-88) 105/63 (06/18 0933) SpO2:  [95 %-100 %] 97 % (06/18 0933)  Intake/Output from previous day:  Intake/Output Summary (Last 24 hours) at 10/31/2017 1144 Last data filed at 10/31/2017 1100 Gross per 24 hour  Intake 3203.33 ml  Output 2270 ml  Net 933.33 ml    Intake/Output this shift: Total I/O In: 740 [P.O.:240; I.V.:500] Out: 100 [Urine:100]  Labs: Recent Labs    10/31/17 0535  HGB 13.4   Recent Labs    10/31/17 0535  WBC 14.1*  RBC 4.02*  HCT 38.9*  PLT 325   Recent Labs    10/31/17 0535  NA 138  K 4.1  CL 105  CO2 26  BUN 17  CREATININE 0.88  GLUCOSE 120*  CALCIUM 8.6*   No results for input(s): LABPT, INR in the last 72 hours.  EXAM General - Patient is Alert, Appropriate and Oriented Extremity - Neurologically intact Neurovascular intact No cellulitis present Compartment soft Dressing - dressing C/D/I Motor Function - intact, moving foot and toes well on exam.  Hemovac pulled without difficulty.  Past Medical History:  Diagnosis Date  . Abscess of groin, left   . Arthritis    R hip & knees  . Cancer ([place])    basal cell, history of   . Closed fracture of right foot    a. 08/2014 Fx of R fifth metatarsal ([facility] Fx).  . Coronary artery disease    a. 11/2007 Cath/PCI: LM nl, LAD 40-27m, d1 40-50ost, LCX nl, RCA 90d (Platinum Study DES - promus), EF 60%.  . Gastroesophageal reflux disease with hiatal hernia   . H/O hiatal hernia   . Hx MRSA infection   . Hypertension   . Pre-syncope    a. 08/2014 w/ sinus bradycardia in 40's in ED.     Assessment/Plan: 1 Day Post-Op Procedure(s) (LRB): RIGHT TOTAL KNEE ARTHROPLASTY (Right) Principal Problem:   OA (osteoarthritis) of knee   Advance diet Up with therapy D/C IV fluids Plan for discharge tomorrow  DVT Prophylaxis - Aspirin Weight-Bearing as tolerated to right leg   Gaynelle Arabian 10/31/2017, 11:44 AM

## 2017-10-31 NOTE — Progress Notes (Signed)
Physical Therapy Treatment Patient Details Name: [name] [name] MRN: [id number] DOB: 1935/06/02 Today's Date: 10/31/2017    History of Present Illness R TKA, h/o R DA-THA 2014    PT Comments    POD # 1 am session Assisted OOB to amb in hallway then returned to room to perform TKR TE's following HEP handout.  Instructed on proper tech, freq as well as use of ICE.   Follow Up Recommendations  Outpatient PT;Follow surgeon's recommendation for DC plan and follow-up therapies     Equipment Recommendations  3in1 (PT)    Recommendations for Other Services       Precautions / Restrictions Precautions Precautions: Knee Precaution Comments: reviewed no pillow under knee Restrictions Weight Bearing Restrictions: No Other Position/Activity Restrictions: WBAT    Mobility  Bed Mobility Overal bed mobility: Needs Assistance Bed Mobility: Supine to Sit     Supine to sit: Min assist     General bed mobility comments: min A to support RLE   Transfers Overall transfer level: Needs assistance Equipment used: Rolling walker (2 wheeled) Transfers: Sit to/from Stand Sit to Stand: Min assist         General transfer comment: VCs hand placement, min A to rise  Ambulation/Gait Ambulation/Gait assistance: Herbalist (Feet): 45 Feet Assistive device: Rolling walker (2 wheeled) Gait Pattern/deviations: Step-to pattern;Decreased step length - right;Decreased step length - left Gait velocity: decreased   General Gait Details: 25% VC's on proper walker to self distance and safety with turns    Marine scientist Rankin (Stroke Patients Only)       Balance                                            Cognition Arousal/Alertness: Awake/alert   Overall Cognitive Status: Within Functional Limits for tasks assessed                                        Exercises   Total Knee  Replacement TE's 10 reps B LE ankle pumps 10 reps towel squeezes 10 reps knee presses 10 reps heel slides  10 reps SAQ's 10 reps SLR's 10 reps ABD Followed by ICE     General Comments        Pertinent Vitals/Pain Pain Assessment: 0-10 Pain Score: 3  Pain Location: R knee Pain Descriptors / Indicators: Sore Pain Intervention(s): Monitored during session;Repositioned;Premedicated before session;Ice applied    Home Living                      Prior Function            PT Goals (current goals can now be found in the care plan section) Progress towards PT goals: Progressing toward goals    Frequency    7X/week      PT Plan Current plan remains appropriate    Co-evaluation              AM-PAC PT "6 Clicks" Daily Activity  Outcome Measure  Difficulty turning over in bed (including adjusting bedclothes, sheets and blankets)?: A Little Difficulty moving from lying on back to sitting on the side of the bed? :  A Little Difficulty sitting down on and standing up from a chair with arms (e.g., wheelchair, bedside commode, etc,.)?: Unable Help needed moving to and from a bed to chair (including a wheelchair)?: A Little Help needed walking in hospital room?: A Little Help needed climbing 3-5 steps with a railing? : A Lot 6 Click Score: 15    End of Session Equipment Utilized During Treatment: Gait belt Activity Tolerance: Patient tolerated treatment well Patient left: in chair;with call bell/phone within reach;with chair alarm set Nurse Communication: Mobility status PT Visit Diagnosis: Pain;Muscle weakness (generalized) (M62.81);Difficulty in walking, not elsewhere classified (R26.2) Pain - Right/Left: Right Pain - part of body: Knee     Time: 1046-1110 PT Time Calculation (min) (ACUTE ONLY): 24 min  Charges:  $Gait Training: 8-22 mins $Therapeutic Exercise: 8-22 mins                    G Codes:       {Gayna Braddy  PTA [facility]  Acute   Rehab Pager      [phone]

## 2017-11-01 LAB — BASIC METABOLIC PANEL
ANION GAP: 9 (ref 5–15)
BUN: 16 mg/dL (ref 6–20)
CALCIUM: 9.1 mg/dL (ref 8.9–10.3)
CO2: 25 mmol/L (ref 22–32)
Chloride: 107 mmol/L (ref 101–111)
Creatinine, Ser: 0.78 mg/dL (ref 0.61–1.24)
GFR calc Af Amer: >60 mL/min (ref >60)
Glucose, Bld: 102 mg/dL — ABNORMAL HIGH (ref 65–99)
POTASSIUM: 3.8 mmol/L (ref 3.5–5.1)
SODIUM: 141 mmol/L (ref 135–145)

## 2017-11-01 LAB — CBC
HCT: 38.3 % — ABNORMAL LOW (ref 39.0–52.0)
Hemoglobin: 13.5 g/dL (ref 13.0–17.0)
MCH: 33.7 pg (ref 26.0–34.0)
MCHC: 35.2 g/dL (ref 30.0–36.0)
MCV: 95.5 fL (ref 78.0–100.0)
PLATELETS: 317 10*3/uL (ref 150–400)
RBC: 4.01 MIL/uL — AB (ref 4.22–5.81)
RDW: 13.3 % (ref 11.5–15.5)
WBC: 13 10*3/uL — AB (ref 4.0–10.5)

## 2017-11-01 MED ORDER — GABAPENTIN 300 MG PO CAPS: 300.0000 mg | ORAL_CAPSULE | Freq: Three times a day (TID) | ORAL | 0 refills | Status: DC

## 2017-11-01 MED ORDER — ASPIRIN 325 MG PO TBEC: 325.0000 mg | DELAYED_RELEASE_TABLET | Freq: Two times a day (BID) | ORAL | 0 refills | Status: AC

## 2017-11-01 MED ORDER — METHOCARBAMOL 500 MG PO TABS: 500.0000 mg | ORAL_TABLET | Freq: Four times a day (QID) | ORAL | 0 refills | Status: DC | PRN

## 2017-11-01 MED ORDER — OXYCODONE HCL 5 MG PO TABS: 5.0000 mg | ORAL_TABLET | Freq: Four times a day (QID) | ORAL | 0 refills | Status: DC | PRN

## 2017-11-01 MED ORDER — TRAMADOL HCL 50 MG PO TABS: 50.0000 mg | ORAL_TABLET | Freq: Four times a day (QID) | ORAL | 0 refills | Status: DC | PRN

## 2017-11-01 NOTE — Progress Notes (Signed)
   Subjective: 2 Days Post-Op Procedure(s) (LRB): RIGHT TOTAL KNEE ARTHROPLASTY (Right) Patient reports pain as mild.   Patient seen in rounds for Dr. Wynelle Link. Patient is well, and has had no acute complaints or problems. Voiding without difficulty and positive flatus. Denies chest pain, SOB, calf pain. Plan is to go Home after hospital stay.  Objective: Vital signs in last 24 hours: Temp:  [97.7 F (36.5 C)-98.2 F (36.8 C)] 98.1 F (36.7 C) (06/19 0537) Pulse Rate:  [38-76] 76 (06/19 0537) Resp:  [16-20] 18 (06/19 0537) BP: (105-147)/(62-90) 146/90 (06/19 0537) SpO2:  [94 %-97 %] 97 % (06/19 0537)  Intake/Output from previous day:  Intake/Output Summary (Last 24 hours) at 11/01/2017 0831 Last data filed at 11/01/2017 0820 Gross per 24 hour  Intake 1220 ml  Output 2300 ml  Net -1080 ml    Intake/Output this shift: Total I/O In: 240 [P.O.:240] Out: 200 [Urine:200]  Labs: Recent Labs    10/31/17 0535 11/01/17 0540  HGB 13.4 13.5   Recent Labs    10/31/17 0535 11/01/17 0540  WBC 14.1* 13.0*  RBC 4.02* 4.01*  HCT 38.9* 38.3*  PLT 325 317   Recent Labs    10/31/17 0535 11/01/17 0540  NA 138 141  K 4.1 3.8  CL 105 107  CO2 26 25  BUN 17 16  CREATININE 0.88 0.78  GLUCOSE 120* 102*  CALCIUM 8.6* 9.1   Exam: General - Patient is Alert and Oriented Extremity - Neurologically intact Neurovascular intact Sensation intact distally Dorsiflexion/Plantar flexion intact Dressing/Incision - clean, dry, no drainage Motor Function - intact, moving foot and toes well on exam.   Past Medical History:  Diagnosis Date  . Abscess of groin, left   . Arthritis    R hip & knees  . Cancer ([place])    basal cell, history of   . Closed fracture of right foot    a. 08/2014 Fx of R fifth metatarsal ([facility] Fx).  . Coronary artery disease    a. 11/2007 Cath/PCI: LM nl, LAD 40-71m, d1 40-50ost, LCX nl, RCA 90d (Platinum Study DES - promus), EF 60%.  . Gastroesophageal reflux  disease with hiatal hernia   . H/O hiatal hernia   . Hx MRSA infection   . Hypertension   . Pre-syncope    a. 08/2014 w/ sinus bradycardia in 40's in ED.    Assessment/Plan: 2 Days Post-Op Procedure(s) (LRB): RIGHT TOTAL KNEE ARTHROPLASTY (Right) Principal Problem:   OA (osteoarthritis) of knee  Estimated body mass index is 21.09 kg/m as calculated from the following:   Height as of this encounter: 5\' 10"  (1.778 m).   Weight as of this encounter: 66.7 kg (147 lb). Up with therapy D/C IV fluids  DVT Prophylaxis - Aspirin Weight-bearing as tolerated  Plan for discharge to home today after therapy if meeting goals and stable. Scheduled for outpatient PT at [facility]. Will follow-up in the office in 2 weeks.   Theresa Duty, PA-C Orthopedic Surgery 11/01/2017, 8:31 AM

## 2017-11-01 NOTE — Progress Notes (Signed)
Physical Therapy Treatment Patient Details Name: [name] [name] MRN: [id number] DOB: 08/29/1935 Today's Date: 11/01/2017    History of Present Illness R TKA, h/o R DA-THA 2014    PT Comments    POD # 2 Assisted OOB to amb a greater distance in hallway.  Practiced one step twice then returned to room to perform tkr te'S FOLLOWING hep HANDOUT.  iNSTRUCTED ON PROPER TECH, FREQ AS WELL AS USE OF ice.   Follow Up Recommendations  Outpatient PT;Follow surgeon's recommendation for DC plan and follow-up therapies     Equipment Recommendations  3in1 (PT)    Recommendations for Other Services       Precautions / Restrictions Precautions Precautions: Knee Precaution Comments: reviewed no pillow under knee Restrictions Weight Bearing Restrictions: No Other Position/Activity Restrictions: WBAT    Mobility  Bed Mobility Overal bed mobility: Needs Assistance Bed Mobility: Supine to Sit     Supine to sit: Min guard     General bed mobility comments: increased time  Transfers Overall transfer level: Needs assistance Equipment used: Rolling walker (2 wheeled) Transfers: Sit to/from Stand Sit to Stand: Supervision;Min guard         General transfer comment: one VC safety with turns  Ambulation/Gait Ambulation/Gait assistance: Supervision;Min guard Gait Distance (Feet): 120 Feet Assistive device: Rolling walker (2 wheeled) Gait Pattern/deviations: Step-to pattern;Decreased step length - right;Decreased step length - left Gait velocity: decreased   General Gait Details: <25% VC's on proper walker to self distance and safety with turns    Stairs Stairs: Yes Stairs assistance: Supervision;Min guard Stair Management: No rails;Forwards;With walker Number of Stairs: 1 General stair comments: 25% VC's on proper tech and walker placement   Wheelchair Mobility    Modified Rankin (Stroke Patients Only)       Balance                                             Cognition Arousal/Alertness: Awake/alert Behavior During Therapy: WFL for tasks assessed/performed Overall Cognitive Status: Within Functional Limits for tasks assessed                                        Exercises   Total Knee Replacement TE's 10 reps B LE ankle pumps 10 reps towel squeezes 10 reps knee presses 10 reps heel slides  10 reps SAQ's 10 reps SLR's 10 reps ABD Followed by ICE     General Comments        Pertinent Vitals/Pain Pain Assessment: 0-10 Pain Score: 3  Pain Location: R knee Pain Descriptors / Indicators: Sore Pain Intervention(s): Monitored during session;Repositioned;Ice applied;Premedicated before session    Home Living                      Prior Function            PT Goals (current goals can now be found in the care plan section) Progress towards PT goals: Progressing toward goals    Frequency    7X/week      PT Plan Current plan remains appropriate    Co-evaluation              AM-PAC PT "6 Clicks" Daily Activity  Outcome Measure  Difficulty turning over in bed (including adjusting  bedclothes, sheets and blankets)?: A Little Difficulty moving from lying on back to sitting on the side of the bed? : A Little Difficulty sitting down on and standing up from a chair with arms (e.g., wheelchair, bedside commode, etc,.)?: Unable Help needed moving to and from a bed to chair (including a wheelchair)?: A Little Help needed walking in hospital room?: A Little Help needed climbing 3-5 steps with a railing? : A Lot 6 Click Score: 15    End of Session Equipment Utilized During Treatment: Gait belt Activity Tolerance: Patient tolerated treatment well Patient left: in chair;with call bell/phone within reach;with chair alarm set Nurse Communication: (pt ready for D/C to home) PT Visit Diagnosis: Pain;Muscle weakness (generalized) (M62.81);Difficulty in walking, not elsewhere classified  (R26.2) Pain - Right/Left: Right Pain - part of body: Knee     Time: 1000-1025 PT Time Calculation (min) (ACUTE ONLY): 25 min  Charges:  $Gait Training: 8-22 mins $Therapeutic Exercise: 8-22 mins                    G Codes:       Rica Koyanagi  PTA [facility]  Acute  Rehab Pager      [phone]

## 2017-11-01 NOTE — Discharge Summary (Signed)
Physician Discharge Summary   Patient ID: [name] [name] MRN: [id number] DOB/AGE: December 16, 1935 82 y.o.  Admit date: 10/30/2017 Discharge date: 11/01/2017  Primary Diagnosis: Osteoarthritis right knee   Admission Diagnoses:  Past Medical History:  Diagnosis Date  . Abscess of groin, left   . Arthritis    R hip & knees  . Cancer (Christine)    basal cell, history of   . Closed fracture of right foot    a. 08/2014 Fx of R fifth metatarsal (Jones Fx).  . Coronary artery disease    a. 11/2007 Cath/PCI: LM nl, LAD 40-52m d1 40-50ost, LCX nl, RCA 90d (Platinum Study DES - promus), EF 60%.  . Gastroesophageal reflux disease with hiatal hernia   . H/O hiatal hernia   . Hx MRSA infection   . Hypertension   . Pre-syncope    a. 08/2014 w/ sinus bradycardia in 40's in ED.   Discharge Diagnoses:   Principal Problem:   OA (osteoarthritis) of knee  Estimated body mass index is 21.09 kg/m as calculated from the following:   Height as of this encounter: _0  (1.778 m).   Weight as of this encounter: 66.7 kg (147 lb).  Procedure:  Procedure(s) (LRB): RIGHT TOTAL KNEE ARTHROPLASTY (Right)   Consults: None  HPI: [name] ARRONAis a 82y.o. year old male with end stage OA of his right knee with progressively worsening pain and dysfunction. He has constant pain, with activity and at rest and significant functional deficits with difficulties even with ADLs. He has had extensive non-op management including analgesics, injections of cortisone and viscosupplements, and home exercise program, but remains in significant pain with significant dysfunction. Radiographs show bone on bone arthritis lateral and patellofemoral with valgus deformity. He presents now for right Total Knee Arthroplasty.  Laboratory Data: Admission on 10/30/2017, Discharged on 11/01/2017  Component Date Value Ref Range Status  . WBC 10/31/2017 14.1* 4.0 - 10.5 K/uL Final  . RBC 10/31/2017 4.02* 4.22 - 5.81 MIL/uL Final  .  Hemoglobin 10/31/2017 13.4  13.0 - 17.0 g/dL Final  . HCT 10/31/2017 38.9* 39.0 - 52.0 % Final  . MCV 10/31/2017 96.8  78.0 - 100.0 fL Final  . MCH 10/31/2017 33.3  26.0 - 34.0 pg Final  . MCHC 10/31/2017 34.4  30.0 - 36.0 g/dL Final  . RDW 10/31/2017 13.3  11.5 - 15.5 % Final  . Platelets 10/31/2017 325  150 - 400 K/uL Final   [facility] [address] [place] [address] . Sodium 10/31/2017 138  135 - 145 mmol/L Final  . Potassium 10/31/2017 4.1  3.5 - 5.1 mmol/L Final  . Chloride 10/31/2017 105  101 - 111 mmol/L Final  . CO2 10/31/2017 26  22 - 32 mmol/L Final  . Glucose, Bld 10/31/2017 120* 65 - 99 mg/dL Final  . BUN 10/31/2017 17  6 - 20 mg/dL Final  . Creatinine, Ser 10/31/2017 0.88  0.61 - 1.24 mg/dL Final  . Calcium 10/31/2017 8.6* 8.9 - 10.3 mg/dL Final  . GFR calc non Af Amer 10/31/2017 >60  >60 mL/min Final  . GFR calc Af Amer 10/31/2017 >60  >60 mL/min Final   Comment: (NOTE) The eGFR has been calculated using the CKD EPI equation. This calculation has not been validated in all clinical situations. eGFR's persistently <60 mL/min signify possible Chronic Kidney Disease.   .Georgiann Hahngap 10/31/2017 7  5 - 15 Final   [facility] at [facility], [place] [address], [place], [place] [address]  . WBC 11/01/2017 13.0* 4.0 - 10.5 K/uL Final  . RBC 11/01/2017 4.01* 4.22 - 5.81 MIL/uL Final  . Hemoglobin 11/01/2017 13.5  13.0 - 17.0 g/dL Final  . HCT 11/01/2017 38.3* 39.0 - 52.0 % Final  . MCV 11/01/2017 95.5  78.0 - 100.0 fL Final  . MCH 11/01/2017 33.7  26.0 - 34.0 pg Final  . MCHC 11/01/2017 35.2  30.0 - 36.0 g/dL Final  . RDW 11/01/2017 13.3  11.5 - 15.5 % Final  . Platelets 11/01/2017 317  150 - 400 K/uL Final   Performed at [facility], [place] [address] [place], [place] [address]  . Sodium 11/01/2017 141  135 - 145 mmol/L Final  . Potassium 11/01/2017 3.8  3.5 - 5.1 mmol/L Final  . Chloride  11/01/2017 107  101 - 111 mmol/L Final  . CO2 11/01/2017 25  22 - 32 mmol/L Final  . Glucose, Bld 11/01/2017 102* 65 - 99 mg/dL Final  . BUN 11/01/2017 16  6 - 20 mg/dL Final  . Creatinine, Ser 11/01/2017 0.78  0.61 - 1.24 mg/dL Final  . Calcium 11/01/2017 9.1  8.9 - 10.3 mg/dL Final  . GFR calc non Af Amer 11/01/2017 >60  >60 mL/min Final  . GFR calc Af Amer 11/01/2017 >60  >60 mL/min Final   Comment: (NOTE) The eGFR has been calculated using the CKD EPI equation. This calculation has not been validated in all clinical situations. eGFR's persistently <60 mL/min signify possible Chronic Kidney Disease.   Georgiann Hahn gap 11/01/2017 9  5 - 15 Final   Performed at [facility], [place] [address], [place], [place] [address]  Hospital Outpatient Visit on 10/19/2017  Component Date Value Ref Range Status  . aPTT 10/19/2017 30  24 - 36 seconds Final   Performed at [facility], [place] [address], [place], [place] [address]  . WBC 10/19/2017 11.3* 4.0 - 10.5 K/uL Final  . RBC 10/19/2017 4.92  4.22 - 5.81 MIL/uL Final  . Hemoglobin 10/19/2017 16.5  13.0 - 17.0 g/dL Final  . HCT 10/19/2017 47.3  39.0 - 52.0 % Final  . MCV 10/19/2017 96.1  78.0 - 100.0 fL Final  . MCH 10/19/2017 33.5  26.0 - 34.0 pg Final  . MCHC 10/19/2017 34.9  30.0 - 36.0 g/dL Final  . RDW 10/19/2017 13.5  11.5 - 15.5 % Final  . Platelets 10/19/2017 348  150 - 400 K/uL Final   Performed at [facility], [place] [address] [place],  [address]  . Sodium 10/19/2017 137  135 - 145 mmol/L Final  . Potassium 10/19/2017 4.1  3.5 - 5.1 mmol/L Final  . Chloride 10/19/2017 99* 101 - 111 mmol/L Final  . CO2 10/19/2017 28  22 - 32 mmol/L Final  . Glucose, Bld 10/19/2017 91  65 - 99 mg/dL Final  . BUN 10/19/2017 21* 6 - 20 mg/dL Final  . Creatinine, Ser 10/19/2017 0.87  0.61 - 1.24 mg/dL Final  . Calcium 10/19/2017 9.4  8.9 - 10.3 mg/dL Final  . Total Protein 10/19/2017 7.4  6.5 -  8.1 g/dL Final  . Albumin 10/19/2017 4.6  3.5 - 5.0 g/dL Final  . AST 10/19/2017 25  15 - 41 U/L Final  . ALT 10/19/2017 26  17 - 63 U/L Final  . Alkaline Phosphatase 10/19/2017 55  38 - 126 U/L Final  . Total Bilirubin 10/19/2017 1.5* 0.3 -  1.2 mg/dL Final  . GFR calc non Af Amer 10/19/2017 >60  >60 mL/min Final  . GFR calc Af Amer 10/19/2017 >60  >60 mL/min Final   Comment: (NOTE) The eGFR has been calculated using the CKD EPI equation. This calculation has not been validated in all clinical situations. eGFR's persistently <60 mL/min signify possible Chronic Kidney Disease.   Georgiann Hahn gap 10/19/2017 10  5 - 15 Final   Performed at [facility], [place] [address], [place], [place] [address]  . Prothrombin Time 10/19/2017 13.1  11.4 - 15.2 seconds Final  . INR 10/19/2017 1.00   Final   Performed at [facility], [place] [address] [place], [place] [address]  . ABO/RH(D) 10/19/2017 A NEG   Final  . Antibody Screen 10/19/2017 NEG   Final  . Sample Expiration 10/19/2017 11/02/2017   Final  . Extend sample reason 10/19/2017    Final                   Value:NO TRANSFUSIONS OR PREGNANCY IN THE PAST 3 MONTHS Performed at [facility], [place] [address] [place], [place] [address]   . Color, Urine 10/19/2017 YELLOW  YELLOW Final  . APPearance 10/19/2017 CLEAR  CLEAR Final  . Specific Gravity, Urine 10/19/2017 1.016  1.005 - 1.030 Final  . pH 10/19/2017 6.0  5.0 - 8.0 Final  . Glucose, UA 10/19/2017 NEGATIVE  NEGATIVE mg/dL Final  . Hgb urine dipstick 10/19/2017 NEGATIVE  NEGATIVE Final  . Bilirubin Urine 10/19/2017 NEGATIVE  NEGATIVE Final  . Ketones, ur 10/19/2017 NEGATIVE  NEGATIVE mg/dL Final  . Protein, ur 10/19/2017 NEGATIVE  NEGATIVE mg/dL Final  . Nitrite 10/19/2017 NEGATIVE  NEGATIVE Final  . Leukocytes, UA 10/19/2017 NEGATIVE  NEGATIVE Final   Performed at [facility], [place] [address] [place], [place] [address]    . MRSA, PCR 10/19/2017 NEGATIVE  NEGATIVE Final  . Staphylococcus aureus 10/19/2017 NEGATIVE  NEGATIVE Final   Comment: (NOTE) The Xpert SA Assay (FDA approved for NASAL specimens in patients 53 years of age and older), is one component of a comprehensive surveillance program. It is not intended to diagnose infection nor to guide or monitor treatment. Performed at [facility], [place] [address] [place], [place] [address]   . ABO/RH(D) 10/19/2017    Final                   Value:A NEG Performed at [facility], [place] [address] [place], [place] [address]   Office Visit on 09/11/2017  Component Date Value Ref Range Status  . WBC 09/11/2017 5.7  4.0 - 10.5 K/uL Final  . RBC 09/11/2017 4.75  4.22 - 5.81 Mil/uL Final  . Hemoglobin 09/11/2017 16.2  13.0 - 17.0 g/dL Final  . HCT 09/11/2017 46.0  39.0 - 52.0 % Final  . MCV 09/11/2017 96.7  78.0 - 100.0 fl Final  . MCHC 09/11/2017 35.3  30.0 - 36.0 g/dL Final  . RDW 09/11/2017 13.0  11.5 - 15.5 % Final  . Platelets 09/11/2017 398.0  150.0 - 400.0 K/uL Final  . Neutrophils Relative % 09/11/2017 65.8  43.0 - 77.0 % Final  . Lymphocytes Relative 09/11/2017 16.4  12.0 - 46.0 % Final  . Monocytes Relative 09/11/2017 15.1* 3.0 - 12.0 % Final  . Eosinophils Relative 09/11/2017 2.2  0.0 - 5.0 % Final  . Basophils Relative 09/11/2017 0.5  0.0 - 3.0 % Final  .  Neutro Abs 09/11/2017 3.8  1.4 - 7.7 K/uL Final  . Lymphs Abs 09/11/2017 0.9  0.7 - 4.0 K/uL Final  . Monocytes Absolute 09/11/2017 0.9  0.1 - 1.0 K/uL Final  . Eosinophils Absolute 09/11/2017 0.1  0.0 - 0.7 K/uL Final  . Basophils Absolute 09/11/2017 0.0  0.0 - 0.1 K/uL Final  . Sodium 09/11/2017 133* 135 - 145 mEq/L Final  . Potassium 09/11/2017 3.8  3.5 - 5.1 mEq/L Final  . Chloride 09/11/2017 95* 96 - 112 mEq/L Final  . CO2 09/11/2017 32  19 - 32 mEq/L Final  . Glucose, Bld 09/11/2017 86  70 - 99 mg/dL Final  . BUN 09/11/2017 14  6 - 23 mg/dL Final   . Creatinine, Ser 09/11/2017 0.78  0.40 - 1.50 mg/dL Final  . Total Bilirubin 09/11/2017 0.8  0.2 - 1.2 mg/dL Final  . Alkaline Phosphatase 09/11/2017 62  39 - 117 U/L Final  . AST 09/11/2017 25  0 - 37 U/L Final  . ALT 09/11/2017 25  0 - 53 U/L Final  . Total Protein 09/11/2017 6.8  6.0 - 8.3 g/dL Final  . Albumin 09/11/2017 4.3  3.5 - 5.2 g/dL Final  . Calcium 09/11/2017 9.7  8.4 - 10.5 mg/dL Final  . GFR 09/11/2017 101.77  >60.00 mL/min Final  . Cholesterol 09/11/2017 142  0 - 200 mg/dL Final   ATP III Classification       Desirable:  < 200 mg/dL               Borderline High:  200 - 239 mg/dL          High:  > = 240 mg/dL  . Triglycerides 09/11/2017 101.0  0.0 - 149.0 mg/dL Final   Normal:  <150 mg/dLBorderline High:  150 - 199 mg/dL  . HDL 09/11/2017 61.80  >39.00 mg/dL Final  . VLDL 09/11/2017 20.2  0.0 - 40.0 mg/dL Final  . LDL Cholesterol 09/11/2017 60  0 - 99 mg/dL Final  . Total CHOL/HDL Ratio 09/11/2017 2   Final                  Men          Women1/2 Average Risk     3.4          3.3Average Risk          5.0          4.42X Average Risk          9.6          7.13X Average Risk          15.0          11.0                      . NonHDL 09/11/2017 80.14   Final   NOTE:  Non-HDL goal should be 30 mg/dL higher than patient's LDL goal (i.e. LDL goal of < 70 mg/dL, would have non-HDL goal of < 100 mg/dL)  . TSH 09/11/2017 1.73  0.35 - 4.50 uIU/mL Final     X-Rays:No results found.  EKG: Orders placed or performed in visit on 09/11/17  . EKG 12-Lead     Hospital Course: [name] [name] is a 82 y.o. who was admitted to [facility]. They were brought to the operating room on 10/30/2017 and underwent Procedure(s): RIGHT TOTAL KNEE ARTHROPLASTY.  Patient tolerated the procedure well and was later transferred to  the recovery room and then to the orthopaedic floor for postoperative care.  They were given PO and IV analgesics for pain control following their surgery.  They were  given 24 hours of postoperative antibiotics of  Anti-infectives (From admission, onward)   Start     Dose/Rate Route Frequency Ordered Stop   10/30/17 1300  ceFAZolin (ANCEF) IVPB 1 g/50 mL premix     1 g 100 mL/hr over 30 Minutes Intravenous Every 6 hours 10/30/17 1027 10/30/17 1852   10/30/17 0600  ceFAZolin (ANCEF) IVPB 2g/100 mL premix     2 g 200 mL/hr over 30 Minutes Intravenous On call to O.R. 10/30/17 0160 10/30/17 0720     and started on DVT prophylaxis in the form of Aspirin.   PT and OT were ordered for total joint protocol.  Discharge planning consulted to help with postop disposition and equipment needs.  Patient had a decent night on the evening of surgery. They started to get OOB with therapy on POD #0. Continued to work with therapy into POD #1, hemovac drain was pulled without difficulty. Patient was seen during rounds on POD #2 and was ready to go home. Dressing was changed and the incision was clean, dry, and intact with no drainage. Patient progressed with therapy and was meeting his goals after one final therapy session and was discharged to home in stable condition.  Diet: Cardiac diet Activity: WBAT Follow-up: in 2 weeks at the office Disposition - Home with outpatient physical therapy at [facility] Discharged Condition: stable   Discharge Instructions    Call MD / Call 911   Complete by:  As directed    If you experience chest pain or shortness of breath, CALL 911 and be transported to the hospital emergency room.  If you develope a fever above 101 F, pus (white drainage) or increased drainage or redness at the wound, or calf pain, call your surgeon's office.   Change dressing   Complete by:  As directed    Change the dressing daily with sterile 4 x 4 inch gauze dressing and apply TED hose.  You may clean the incision with alcohol prior to redressing.   Constipation Prevention   Complete by:  As directed    Drink plenty of fluids.  Prune juice may be helpful.  You  may use a stool softener, such as Colace (over the counter) 100 mg twice a day.  Use MiraLax (over the counter) for constipation as needed.   Diet - low sodium heart healthy   Complete by:  As directed    Discharge instructions   Complete by:  As directed    Dr. Gaynelle Arabian Total Joint Specialist [facility] [address] [place], [place] [address] [phone]  TOTAL KNEE REPLACEMENT POSTOPERATIVE DIRECTIONS  Knee Rehabilitation, Guidelines Following Surgery  Results after knee surgery are often greatly improved when you follow the exercise, range of motion and muscle strengthening exercises prescribed by your doctor. Safety measures are also important to protect the knee from further injury. Any time any of these exercises cause you to have increased pain or swelling in your knee joint, decrease the amount until you are comfortable again and slowly increase them. If you have problems or questions, call your caregiver or physical therapist for advice.   HOME CARE INSTRUCTIONS  Remove items at home which could result in a fall. This includes throw rugs or furniture in walking pathways.  ICE to the affected knee every three hours  for 30 minutes at a time and then as needed for pain and swelling.  Continue to use ice on the knee for pain and swelling from surgery. You may notice swelling that will progress down to the foot and ankle.  This is normal after surgery.  Elevate the leg when you are not up walking on it.   Continue to use the breathing machine which will help keep your temperature down.  It is common for your temperature to cycle up and down following surgery, especially at night when you are not up moving around and exerting yourself.  The breathing machine keeps your lungs expanded and your temperature down. Do not place pillow under knee, focus on keeping the knee straight while resting   DIET You may resume your previous home diet once your are discharged from the  hospital.  DRESSING / WOUND CARE / SHOWERING You may shower 3 days after surgery, but keep the wounds dry during showering.  You may use an occlusive plastic wrap (Press'n Seal for example), NO SOAKING/SUBMERGING IN THE BATHTUB.  If the bandage gets wet, change with a clean dry gauze.  If the incision gets wet, pat the wound dry with a clean towel. You may start showering once you are discharged home but do not submerge the incision under water. Just pat the incision dry and apply a dry gauze dressing on daily. Change the surgical dressing daily and reapply a dry dressing each time.  ACTIVITY Walk with your walker as instructed. Use walker as long as suggested by your caregivers. Avoid periods of inactivity such as sitting longer than an hour when not asleep. This helps prevent blood clots.  You may resume a sexual relationship in one month or when given the OK by your doctor.  You may return to work once you are cleared by your doctor.  Do not drive a car for 6 weeks or until released by you surgeon.  Do not drive while taking narcotics.  WEIGHT BEARING Weight bearing as tolerated with assist device (walker, cane, etc) as directed, use it as long as suggested by your surgeon or therapist, typically at least 4-6 weeks.  POSTOPERATIVE CONSTIPATION PROTOCOL Constipation - defined medically as fewer than three stools per week and severe constipation as less than one stool per week.  One of the most common issues patients have following surgery is constipation.  Even if you have a regular bowel pattern at home, your normal regimen is likely to be disrupted due to multiple reasons following surgery.  Combination of anesthesia, postoperative narcotics, change in appetite and fluid intake all can affect your bowels.  In order to avoid complications following surgery, here are some recommendations in order to help you during your recovery period.  Colace (docusate) - Pick up an over-the-counter form  of Colace or another stool softener and take twice a day as long as you are requiring postoperative pain medications.  Take with a full glass of water daily.  If you experience loose stools or diarrhea, hold the colace until you stool forms back up.  If your symptoms do not get better within 1 week or if they get worse, check with your doctor.  Dulcolax (bisacodyl) - Pick up over-the-counter and take as directed by the product packaging as needed to assist with the movement of your bowels.  Take with a full glass of water.  Use this product as needed if not relieved by Colace only.   MiraLax (polyethylene glycol) - Pick  up over-the-counter to have on hand.  MiraLax is a solution that will increase the amount of water in your bowels to assist with bowel movements.  Take as directed and can mix with a glass of water, juice, soda, coffee, or tea.  Take if you go more than two days without a movement. Do not use MiraLax more than once per day. Call your doctor if you are still constipated or irregular after using this medication for 7 days in a row.  If you continue to have problems with postoperative constipation, please contact the office for further assistance and recommendations.  If you experience "the worst abdominal pain ever" or develop nausea or vomiting, please contact the office immediatly for further recommendations for treatment.  ITCHING  If you experience itching with your medications, try taking only a single pain pill, or even half a pain pill at a time.  You can also use Benadryl over the counter for itching or also to help with sleep.   TED HOSE STOCKINGS Wear the elastic stockings on both legs for three weeks following surgery during the day but you may remove then at night for sleeping.  MEDICATIONS See your medication summary on the "After Visit Summary" that the nursing staff will review with you prior to discharge.  You may have some home medications which will be placed on hold  until you complete the course of blood thinner medication.  It is important for you to complete the blood thinner medication as prescribed by your surgeon.  Continue your approved medications as instructed at time of discharge.  PRECAUTIONS If you experience chest pain or shortness of breath - call 911 immediately for transfer to the hospital emergency department.  If you develop a fever greater that 101 F, purulent drainage from wound, increased redness or drainage from wound, foul odor from the wound/dressing, or calf pain - CONTACT YOUR SURGEON.                                                   FOLLOW-UP APPOINTMENTS Make sure you keep all of your appointments after your operation with your surgeon and caregivers. You should call the office at the above phone number and make an appointment for approximately two weeks after the date of your surgery or on the date instructed by your surgeon outlined in the "After Visit Summary".   RANGE OF MOTION AND STRENGTHENING EXERCISES  Rehabilitation of the knee is important following a knee injury or an operation. After just a few days of immobilization, the muscles of the thigh which control the knee become weakened and shrink (atrophy). Knee exercises are designed to build up the tone and strength of the thigh muscles and to improve knee motion. Often times heat used for twenty to thirty minutes before working out will loosen up your tissues and help with improving the range of motion but do not use heat for the first two weeks following surgery. These exercises can be done on a training (exercise) mat, on the floor, on a table or on a bed. Use what ever works the best and is most comfortable for you Knee exercises include:  Leg Lifts - While your knee is still immobilized in a splint or cast, you can do straight leg raises. Lift the leg to 60 degrees, hold for 3 sec, and slowly  lower the leg. Repeat 10-20 times 2-3 times daily. Perform this exercise against  resistance later as your knee gets better.  Quad and Hamstring Sets - Tighten up the muscle on the front of the thigh (Quad) and hold for 5-10 sec. Repeat this 10-20 times hourly. Hamstring sets are done by pushing the foot backward against an object and holding for 5-10 sec. Repeat as with quad sets.  Leg Slides: Lying on your back, slowly slide your foot toward your buttocks, bending your knee up off the floor (only go as far as is comfortable). Then slowly slide your foot back down until your leg is flat on the floor again. Angel Wings: Lying on your back spread your legs to the side as far apart as you can without causing discomfort.  A rehabilitation program following serious knee injuries can speed recovery and prevent re-injury in the future due to weakened muscles. Contact your doctor or a physical therapist for more information on knee rehabilitation.   IF YOU ARE TRANSFERRED TO A SKILLED REHAB FACILITY If the patient is transferred to a skilled rehab facility following release from the hospital, a list of the current medications will be sent to the facility for the patient to continue.  When discharged from the skilled rehab facility, please have the facility set up the patient's [place] prior to being released. Also, the skilled facility will be responsible for providing the patient with their medications at time of release from the facility to include their pain medication, the muscle relaxants, and their blood thinner medication. If the patient is still at the rehab facility at time of the two week follow up appointment, the skilled rehab facility will also need to assist the patient in arranging follow up appointment in our office and any transportation needs.  MAKE SURE YOU:  Understand these instructions.  Get help right away if you are not doing well or get worse.    Pick up stool softner and laxative for home use following surgery while on pain medications. Do  not submerge incision under water. Please use good hand washing techniques while changing dressing each day. May shower starting three days after surgery. Please use a clean towel to pat the incision dry following showers. Continue to use ice for pain and swelling after surgery. Do not use any lotions or creams on the incision until instructed by your surgeon.   Do not put a pillow under the knee. Place it under the heel.   Complete by:  As directed    Driving restrictions   Complete by:  As directed    No driving for two weeks   TED hose   Complete by:  As directed    Use stockings (TED hose) for three weeks on both leg(s).  You may remove them at night for sleeping.   Weight bearing as tolerated   Complete by:  As directed      Allergies as of 11/01/2017   No Known Allergies     Medication List    TAKE these medications   acetaminophen 500 MG tablet Commonly known as:  TYLENOL Take 1,000 mg by mouth 2 (two) times daily.   aspirin 325 MG EC tablet Take 1 tablet (325 mg total) by mouth 2 (two) times daily for 19 days. Take one tablet (325 mg) Aspirin two times a day for three weeks following surgery. Then resume one baby Aspirin (81 mg) once a day. What changed:    medication  strength  how much to take  when to take this  additional instructions   benazepril-hydrochlorthiazide 20-25 MG tablet Commonly known as:  LOTENSIN HCT TAKE 1 TABLET BY MOUTH EVERY DAY IN THE MORNING   cholecalciferol 1000 units tablet Commonly known as:  VITAMIN D Take 1,000 Units by mouth daily.   gabapentin 300 MG capsule Commonly known as:  NEURONTIN Take 1 capsule (300 mg total) by mouth 3 (three) times daily. Gabapentin 300 mg Protocol Take a 300 mg capsule three times a day for two weeks following surgery. Then take a 300 mg capsule two times a day for two weeks.  Then take a 300 mg capsule once a day for two weeks.  Then discontinue the Gabapentin.   methocarbamol 500 MG  tablet Commonly known as:  ROBAXIN Take 1 tablet (500 mg total) by mouth every 6 (six) hours as needed for muscle spasms.   multivitamin with minerals Tabs tablet Take 1 tablet by mouth daily with breakfast.   nitroGLYCERIN 0.4 MG SL tablet Commonly known as:  NITROSTAT Place 0.4 mg under the tongue every 5 (five) minutes as needed for chest pain (3 doses MAX).   oxyCODONE 5 MG immediate release tablet Commonly known as:  Oxy IR/ROXICODONE Take 1-2 tablets (5-10 mg total) by mouth every 6 (six) hours as needed for moderate pain or severe pain.   PROBIOTIC PO Take 1 capsule by mouth daily with breakfast.   rosuvastatin 20 MG tablet Commonly known as:  CRESTOR TAKE 1 TABLET EVERY OTHER DAY   traMADol 50 MG tablet Commonly known as:  ULTRAM Take 1-2 tablets (50-100 mg total) by mouth every 6 (six) hours as needed for moderate pain (if unrelieved by oxycodone).            Discharge Care Instructions  (From admission, onward)        Start     Ordered   11/01/17 0000  Weight bearing as tolerated     11/01/17 0835   11/01/17 0000  Change dressing    Comments:  Change the dressing daily with sterile 4 x 4 inch gauze dressing and apply TED hose.  You may clean the incision with alcohol prior to redressing.   11/01/17 0835     Follow-up Information    Gaynelle Arabian, MD Follow up in 2 week(s).   Specialty:  Orthopedic Surgery Contact information: [address] [place] [address] [phone]           Signed: Theresa Duty, PA-C Orthopaedic Surgery 11/01/2017, 4:23 PM

## 2017-11-02 DIAGNOSIS — M25561 Pain in right knee: Secondary | ICD-10-CM | POA: Diagnosis not present

## 2017-11-06 DIAGNOSIS — M25561 Pain in right knee: Secondary | ICD-10-CM | POA: Diagnosis not present

## 2017-11-08 DIAGNOSIS — M25561 Pain in right knee: Secondary | ICD-10-CM | POA: Diagnosis not present

## 2017-11-10 DIAGNOSIS — M25561 Pain in right knee: Secondary | ICD-10-CM | POA: Diagnosis not present

## 2017-11-13 DIAGNOSIS — M25561 Pain in right knee: Secondary | ICD-10-CM | POA: Diagnosis not present

## 2017-11-15 DIAGNOSIS — M25561 Pain in right knee: Secondary | ICD-10-CM | POA: Diagnosis not present

## 2017-11-20 DIAGNOSIS — M25561 Pain in right knee: Secondary | ICD-10-CM | POA: Diagnosis not present

## 2017-11-22 DIAGNOSIS — M25561 Pain in right knee: Secondary | ICD-10-CM | POA: Diagnosis not present

## 2017-11-24 DIAGNOSIS — M25561 Pain in right knee: Secondary | ICD-10-CM | POA: Diagnosis not present

## 2017-11-27 DIAGNOSIS — M25561 Pain in right knee: Secondary | ICD-10-CM | POA: Diagnosis not present

## 2017-11-29 DIAGNOSIS — M25561 Pain in right knee: Secondary | ICD-10-CM | POA: Diagnosis not present

## 2017-12-05 DIAGNOSIS — Z96651 Presence of right artificial knee joint: Secondary | ICD-10-CM | POA: Diagnosis not present

## 2017-12-05 DIAGNOSIS — Z471 Aftercare following joint replacement surgery: Secondary | ICD-10-CM | POA: Diagnosis not present

## 2017-12-06 DIAGNOSIS — M25561 Pain in right knee: Secondary | ICD-10-CM | POA: Diagnosis not present

## 2017-12-13 DIAGNOSIS — M25561 Pain in right knee: Secondary | ICD-10-CM | POA: Diagnosis not present

## 2017-12-15 DIAGNOSIS — M25561 Pain in right knee: Secondary | ICD-10-CM | POA: Diagnosis not present

## 2017-12-29 DIAGNOSIS — L821 Other seborrheic keratosis: Secondary | ICD-10-CM | POA: Diagnosis not present

## 2017-12-29 DIAGNOSIS — L570 Actinic keratosis: Secondary | ICD-10-CM | POA: Diagnosis not present

## 2017-12-29 DIAGNOSIS — Z85828 Personal history of other malignant neoplasm of skin: Secondary | ICD-10-CM | POA: Diagnosis not present

## 2018-01-10 ENCOUNTER — Other Ambulatory Visit: Payer: Self-pay | Admitting: Cardiovascular Disease

## 2018-01-12 ENCOUNTER — Encounter: Payer: Self-pay | Admitting: Family Medicine

## 2018-01-12 ENCOUNTER — Ambulatory Visit (INDEPENDENT_AMBULATORY_CARE_PROVIDER_SITE_OTHER): Payer: Medicare Other | Admitting: Family Medicine

## 2018-01-12 VITALS — BP 116/60 | HR 60 | Temp 98.4°F | Ht 70.0 in | Wt 146.6 lb

## 2018-01-12 DIAGNOSIS — I2510 Atherosclerotic heart disease of native coronary artery without angina pectoris: Secondary | ICD-10-CM | POA: Diagnosis not present

## 2018-01-12 DIAGNOSIS — J069 Acute upper respiratory infection, unspecified: Secondary | ICD-10-CM | POA: Diagnosis not present

## 2018-01-12 NOTE — Progress Notes (Signed)
   Subjective:    Patient ID: KAYCEN WHITWORTH, male    DOB: 08/14/1935, 82 y.o.   MRN: [id number]  HPI Here for 3 days of chest congestion and coughing up yellow sputum. No fever. Using Dayquil.    Review of Systems  Constitutional: Negative.   HENT: Negative.   Eyes: Negative.   Respiratory: Positive for cough and chest tightness.        Objective:   Physical Exam  Constitutional: He appears well-developed and well-nourished.  HENT:  Right Ear: External ear normal.  Left Ear: External ear normal.  Nose: Nose normal.  Mouth/Throat: Oropharynx is clear and moist.  Eyes: Conjunctivae are normal.  Neck: No thyromegaly present.  Pulmonary/Chest: Effort normal and breath sounds normal. No stridor. No respiratory distress. He has no wheezes. He has no rales.  Scattered rhonchi   Lymphadenopathy:    He has no cervical adenopathy.          Assessment & Plan:  Viral URI. Drink fluids and take Mucinex prn. Alysia Penna, MD

## 2018-02-12 ENCOUNTER — Ambulatory Visit (INDEPENDENT_AMBULATORY_CARE_PROVIDER_SITE_OTHER): Payer: Medicare Other | Admitting: *Deleted

## 2018-02-12 DIAGNOSIS — Z23 Encounter for immunization: Secondary | ICD-10-CM | POA: Diagnosis not present

## 2018-02-12 NOTE — Progress Notes (Signed)
Per orders of Dr. Sarajane Jews, injection of influenza given by Westley Hummer. Patient tolerated injection well.

## 2018-02-14 DIAGNOSIS — K644 Residual hemorrhoidal skin tags: Secondary | ICD-10-CM | POA: Diagnosis not present

## 2018-03-22 NOTE — Progress Notes (Signed)
CARDIOLOGY OFFICE NOTE  Date:  03/29/2018    [name] [name] Date of Birth: [birth year] Medical Record #[id number]  PCP:  [name] Morale, MD  Cardiologist:  Andrez Grime chief complaint on file.   History of Present Illness: [name] [name] is a 82 y.o. male  f/u CAD.     He has a hx of CAD status post DES to the RCA in 2009, HTN, & HLD History of bradycardia on beta blocker associated with dizziness . Event monitor was arranged. Apparently, according to the notes in the chart, this demonstrated NSR, PACs and no significant arrhythmia.  Myovue 09/30/16 old inferior MI no ischemia EF 56% low risk Echo 09/30/16 EF 60-10% grade 2 diastolic mild AS mean gradient 10 mmHg peak 21 mmHg Carotid: 09/22/16 Plaque no stenosis f/u May 2020  Daughter works at [employer] and [employer] currently  Reviewed echo from today 09/15/17 and EF remains normal with mean gradient 15 mmHg peak 32 mmHg  Had uneventful right TKR with Dr Maureen Ralphs on June 17,19 and has progressed well with PT  No cardiac symptoms   Past Medical History:  Diagnosis Date  . Abscess of groin, left   . Arthritis    R hip & knees  . Cancer ([place])    basal cell, history of   . Closed fracture of right foot    a. 08/2014 Fx of R fifth metatarsal (Jones Fx).  . Coronary artery disease    a. 11/2007 Cath/PCI: LM nl, LAD 40-77m, d1 40-50ost, LCX nl, RCA 90d (Platinum Study DES - promus), EF 60%.  . Gastroesophageal reflux disease with hiatal hernia   . H/O hiatal hernia   . Hx MRSA infection   . Hypertension   . Pre-syncope    a. 08/2014 w/ sinus bradycardia in 40's in ED.    Past Surgical History:  Procedure Laterality Date  . APPENDECTOMY    . CARDIAC CATHETERIZATION  12/06/07   60%  . COLONOSCOPY    . CORONARY ANGIOPLASTY WITH STENT PLACEMENT  11/2007  . HERNIA REPAIR    . KNEE ARTHROSCOPY Right 10/03/2012   Procedure: RIGHT KNEEE ARTHROSCOPY WITH DEBRIDEMENT ;  Surgeon: Gearlean Alf, MD;  Location:  [facility];  Service: Orthopedics;  Laterality: Right;  . SHOULDER SURGERY  05/2016   rotator cuff and ligament repair   . TOTAL HIP ARTHROPLASTY Right 04/05/2013   Procedure: RIGHT TOTAL HIP ARTHROPLASTY ANTERIOR APPROACH;  Surgeon: Gearlean Alf, MD;  Location: [facility];  Service: Orthopedics;  Laterality: Right;  . TOTAL KNEE ARTHROPLASTY Right 10/30/2017   Procedure: RIGHT TOTAL KNEE ARTHROPLASTY;  Surgeon: Gaynelle Arabian, MD;  Location: [facility];  Service: Orthopedics;  Laterality: Right;  . YAG LASER APPLICATION     for glaucoma - both eyes, no drops as of yet      Medications: Current Outpatient Medications  Medication Sig Dispense Refill  . aspirin (ASPIR-LOW) 81 MG EC tablet Take 81 mg by mouth daily. Swallow whole.    . benazepril-hydrochlorthiazide (LOTENSIN HCT) 20-25 MG tablet TAKE 1 TABLET BY MOUTH EVERY DAY IN THE MORNING 90 tablet 2  . cholecalciferol (VITAMIN D) 1000 UNITS tablet Take 1,000 Units by mouth daily.     . Multiple Vitamin (MULTIVITAMIN WITH MINERALS) TABS tablet Take 1 tablet by mouth daily with breakfast.    . nitroGLYCERIN (NITROSTAT) 0.4 MG SL tablet Place 0.4 mg under the tongue every 5 (five) minutes as needed for chest pain (3  doses MAX).    . Probiotic Product (PROBIOTIC PO) Take 1 capsule by mouth daily with breakfast.    . rosuvastatin (CRESTOR) 20 MG tablet TAKE 1 TABLET EVERY OTHER DAY 45 tablet 3   No current facility-administered medications for this visit.     Allergies: No Known Allergies  Social History: The patient  reports that he quit smoking about 56 years ago. He has never used smokeless tobacco. He reports that he drinks alcohol. He reports that he does not use drugs.   Family History: The patient's family history includes Heart disease in his unknown relative; Hypertension in his brother, sister, and unknown relative; Stomach cancer in his father.   Review of Systems: Please see the history of present illness.   Otherwise, the review of  systems is positive for none.   All other systems are reviewed and negative.   Physical Exam: VS:  BP 138/80   Pulse 68   Ht 5\' 10"  (1.778 m)   Wt 151 lb 8 oz (68.7 kg) Comment: 150 lbs pt reported  SpO2 97%   BMI 21.74 kg/m  .  BMI Body mass index is 21.74 kg/m.  Wt Readings from Last 3 Encounters:  03/29/18 151 lb 8 oz (68.7 kg)  01/12/18 146 lb 9.6 oz (66.5 kg)  10/30/17 147 lb (66.7 kg)    Affect appropriate Healthy:  appears stated age HEENT: normal Neck supple with no adenopathy JVP normal no bruits no thyromegaly Lungs clear with no wheezing and good diaphragmatic motion Heart:  S1/S2 mild  AS  murmur, no rub, gallop or click PMI normal Abdomen: benighn, BS positve, no tenderness, no AAA no bruit.  No HSM or HJR Distal pulses intact with no bruits No edema Neuro non-focal Skin warm and dry Post right TKR     LABORATORY DATA:  EKG:   09/16/16  SR wendebach 3:2   Lab Results  Component Value Date   WBC 13.0 (H) 11/01/2017   HGB 13.5 11/01/2017   HCT 38.3 (L) 11/01/2017   PLT 317 11/01/2017   GLUCOSE 102 (H) 11/01/2017   CHOL 142 09/11/2017   TRIG 101.0 09/11/2017   HDL 61.80 09/11/2017   LDLCALC 60 09/11/2017   ALT 26 10/19/2017   AST 25 10/19/2017   NA 141 11/01/2017   K 3.8 11/01/2017   CL 107 11/01/2017   CREATININE 0.78 11/01/2017   BUN 16 11/01/2017   CO2 25 11/01/2017   TSH 1.73 09/11/2017   PSA 0.66 11/30/2009   INR 1.00 10/19/2017   HGBA1C  12/07/2007    5.5 (NOTE)   The ADA recommends the following therapeutic goal for glycemic   control related to Hgb A1C measurement:   Goal of Therapy:   < 7.0% Hgb A1C   Reference: American Diabetes Association: Clinical Practice   Recommendations 2008, Diabetes Care,  2008, 31:(Suppl 1).    BNP (last 3 results) No results for input(s): BNP in the last 8760 hours.  ProBNP (last 3 results) No results for input(s): PROBNP in the last 8760 hours.   Other Studies Reviewed Today:  Echo 09/08/14 -  EF 55%to 60%. Wall motion was normal; Grade 1 diastolicdysfunction. - Aortic valve: Moderately calcified annulus. Trileaflet. Moderatethickening and calcification, consistent with sclerosis. - Mitral valve: There was mild regurgitation. - Pulmonic valve: There was trivial regurgitation.  LHC 11/2007 LM: Normal LAD: Proximal to mid 40-50%, ostial D1 40-50% LCx: Normal RCA: Distal 90% EF 60% PCI: DES to the RCA  Assessment/Plan:  CAD:  No angina.  Continue ASA, ACE inhibitor, statin. Non ischemic myovue May 2018 clear to have knee surgery  Benign essential HTN:  Well controlled.  Continue current medications and low sodium Dash type diet.    Hyperlipidemia:  Continue statin labs with primary   Bradycardia:  History of AV block and wenckebach beta blocker stopped  No indication for pacer evaluation   Murmur:  Mild to moderate AS slight progression mean gradient 15 mmHg echo 09/15/17 f/u in a year   Bruit:  Right side ASA  [place] palque no stenosis likely transmitted AS murmur   Ortho:  Post right TKR 10/30/17 Dr Maureen Ralphs improved continue PT  Disposition:   f/u in a year    Jenkins Rouge

## 2018-03-27 DIAGNOSIS — H18413 Arcus senilis, bilateral: Secondary | ICD-10-CM | POA: Diagnosis not present

## 2018-03-27 DIAGNOSIS — H1851 Endothelial corneal dystrophy: Secondary | ICD-10-CM | POA: Diagnosis not present

## 2018-03-27 DIAGNOSIS — Z961 Presence of intraocular lens: Secondary | ICD-10-CM | POA: Diagnosis not present

## 2018-03-27 DIAGNOSIS — H02831 Dermatochalasis of right upper eyelid: Secondary | ICD-10-CM | POA: Diagnosis not present

## 2018-03-29 ENCOUNTER — Ambulatory Visit (INDEPENDENT_AMBULATORY_CARE_PROVIDER_SITE_OTHER): Payer: Medicare Other | Admitting: Cardiovascular Disease

## 2018-03-29 ENCOUNTER — Encounter: Payer: Self-pay | Admitting: Cardiovascular Disease

## 2018-03-29 VITALS — BP 138/80 | HR 68 | Ht 70.0 in | Wt 151.5 lb

## 2018-03-29 DIAGNOSIS — I10 Essential (primary) hypertension: Secondary | ICD-10-CM

## 2018-03-29 DIAGNOSIS — I350 Nonrheumatic aortic (valve) stenosis: Secondary | ICD-10-CM

## 2018-03-29 DIAGNOSIS — R001 Bradycardia, unspecified: Secondary | ICD-10-CM | POA: Diagnosis not present

## 2018-03-29 DIAGNOSIS — I2510 Atherosclerotic heart disease of native coronary artery without angina pectoris: Secondary | ICD-10-CM

## 2018-03-29 DIAGNOSIS — R011 Cardiac murmur, unspecified: Secondary | ICD-10-CM

## 2018-03-29 DIAGNOSIS — E785 Hyperlipidemia, unspecified: Secondary | ICD-10-CM | POA: Diagnosis not present

## 2018-03-29 DIAGNOSIS — R0989 Other specified symptoms and signs involving the circulatory and respiratory systems: Secondary | ICD-10-CM

## 2018-03-29 NOTE — Patient Instructions (Addendum)
Medication Instructions:   If you need a refill on your cardiac medications before your next appointment, please call your pharmacy.   Lab work:  If you have labs (blood work) drawn today and your tests are completely normal, you will receive your results only by: Marland Kitchen MyChart Message (if you have MyChart) OR . A paper copy in the mail If you have any lab test that is abnormal or we need to change your treatment, we will call you to review the results.  Testing/Procedures: Your physician has requested that you have an echocardiogram in May. Echocardiography is a painless test that uses sound waves to create images of your heart. It provides your doctor with information about the size and shape of your heart and how well your heart's chambers and valves are working. This procedure takes approximately one hour. There are no restrictions for this procedure.  Follow-Up: At [facility], you and your health needs are our priority.  As part of our continuing mission to provide you with exceptional heart care, we have created designated Provider Care Teams.  These Care Teams include your primary Cardiologist (physician) and Advanced Practice Providers (APPs -  Physician Assistants and Nurse Practitioners) who all work together to provide you with the care you need, when you need it. You will need a follow up appointment in 6 months.  Please call our office 2 months in advance to schedule this appointment.  You may see Jenkins Rouge, MD or one of the following Advanced Practice Providers on your designated Care Team:   Truitt Merle, NP Cecilie Kicks, NP . Kathyrn Drown, NP

## 2018-04-02 ENCOUNTER — Ambulatory Visit (INDEPENDENT_AMBULATORY_CARE_PROVIDER_SITE_OTHER): Payer: Medicare Other | Admitting: Adult Health

## 2018-04-02 ENCOUNTER — Encounter: Payer: Self-pay | Admitting: Adult Health

## 2018-04-02 VITALS — BP 110/52 | Temp 97.9°F | Wt 148.0 lb

## 2018-04-02 DIAGNOSIS — B9789 Other viral agents as the cause of diseases classified elsewhere: Secondary | ICD-10-CM

## 2018-04-02 DIAGNOSIS — I2510 Atherosclerotic heart disease of native coronary artery without angina pectoris: Secondary | ICD-10-CM | POA: Diagnosis not present

## 2018-04-02 DIAGNOSIS — J329 Chronic sinusitis, unspecified: Secondary | ICD-10-CM

## 2018-04-02 NOTE — Progress Notes (Signed)
Subjective:    Patient ID: [name] [name], male    DOB: 1935-07-31, 82 y.o.   MRN: [id number]  Sinusitis  This is a new problem. The current episode started in the past 7 days. The problem has been gradually worsening since onset. Associated symptoms include chills, congestion, coughing (productive ), headaches, a hoarse voice and sinus pressure. Past treatments include oral decongestants. The treatment provided no relief.    Review of Systems  Constitutional: Positive for chills.  HENT: Positive for congestion, hoarse voice, postnasal drip, rhinorrhea, sinus pressure and sinus pain.   Respiratory: Positive for cough (productive ).   Cardiovascular: Negative.   Neurological: Positive for headaches.   Past Medical History:  Diagnosis Date  . Abscess of groin, left   . Arthritis    R hip & knees  . Cancer ([place])    basal cell, history of   . Closed fracture of right foot    a. 08/2014 Fx of R fifth metatarsal (Jones Fx).  . Coronary artery disease    a. 11/2007 Cath/PCI: LM nl, LAD 40-52m, d1 40-50ost, LCX nl, RCA 90d (Platinum Study DES - promus), EF 60%.  . Gastroesophageal reflux disease with hiatal hernia   . H/O hiatal hernia   . Hx MRSA infection   . Hypertension   . Pre-syncope    a. 08/2014 w/ sinus bradycardia in 40's in ED.    Social History   Socioeconomic History  . Marital status: Married    Spouse name: Not on file  . Number of children: Not on file  . Years of education: Not on file  . Highest education level: Not on file  Occupational History  . Not on file  Social Needs  . Financial resource strain: Not on file  . Food insecurity:    Worry: Not on file    Inability: Not on file  . Transportation needs:    Medical: Not on file    Non-medical: Not on file  Tobacco Use  . Smoking status: Former Smoker    Last attempt to quit: 09/28/1961    Years since quitting: 56.5  . Smokeless tobacco: Never Used  Substance and Sexual Activity  . Alcohol use: Yes     Comment: occ  . Drug use: No  . Sexual activity: Not on file  Lifestyle  . Physical activity:    Days per week: Not on file    Minutes per session: Not on file  . Stress: Not on file  Relationships  . Social connections:    Talks on phone: Not on file    Gets together: Not on file    Attends religious service: Not on file    Active member of club or organization: Not on file    Attends meetings of clubs or organizations: Not on file    Relationship status: Not on file  . Intimate partner violence:    Fear of current or ex partner: Not on file    Emotionally abused: Not on file    Physically abused: Not on file    Forced sexual activity: Not on file  Other Topics Concern  . Not on file  Social History Narrative  . Not on file    Past Surgical History:  Procedure Laterality Date  . APPENDECTOMY    . CARDIAC CATHETERIZATION  12/06/07   60%  . COLONOSCOPY    . CORONARY ANGIOPLASTY WITH STENT PLACEMENT  11/2007  . HERNIA REPAIR    . KNEE  ARTHROSCOPY Right 10/03/2012   Procedure: RIGHT KNEEE ARTHROSCOPY WITH DEBRIDEMENT ;  Surgeon: Gearlean Alf, MD;  Location: [facility];  Service: Orthopedics;  Laterality: Right;  . SHOULDER SURGERY  05/2016   rotator cuff and ligament repair   . TOTAL HIP ARTHROPLASTY Right 04/05/2013   Procedure: RIGHT TOTAL HIP ARTHROPLASTY ANTERIOR APPROACH;  Surgeon: Gearlean Alf, MD;  Location: [facility];  Service: Orthopedics;  Laterality: Right;  . TOTAL KNEE ARTHROPLASTY Right 10/30/2017   Procedure: RIGHT TOTAL KNEE ARTHROPLASTY;  Surgeon: Gaynelle Arabian, MD;  Location: [facility];  Service: Orthopedics;  Laterality: Right;  . YAG LASER APPLICATION     for glaucoma - both eyes, no drops as of yet     Family History  Problem Relation Age of Onset  . Hypertension Sister   . Hypertension Brother   . Stomach cancer Father        family hx  . Hypertension Unknown        family hx  . Heart disease Unknown        family hx  . Heart attack Neg Hx   .  Stroke Neg Hx     No Known Allergies  Current Outpatient Medications on File Prior to Visit  Medication Sig Dispense Refill  . aspirin (ASPIR-LOW) 81 MG EC tablet Take 81 mg by mouth daily. Swallow whole.    . benazepril-hydrochlorthiazide (LOTENSIN HCT) 20-25 MG tablet TAKE 1 TABLET BY MOUTH EVERY DAY IN THE MORNING 90 tablet 2  . cholecalciferol (VITAMIN D) 1000 UNITS tablet Take 1,000 Units by mouth daily.     . Multiple Vitamin (MULTIVITAMIN WITH MINERALS) TABS tablet Take 1 tablet by mouth daily with breakfast.    . nitroGLYCERIN (NITROSTAT) 0.4 MG SL tablet Place 0.4 mg under the tongue every 5 (five) minutes as needed for chest pain (3 doses MAX).    . Probiotic Product (PROBIOTIC PO) Take 1 capsule by mouth daily with breakfast.    . rosuvastatin (CRESTOR) 20 MG tablet TAKE 1 TABLET EVERY OTHER DAY 45 tablet 3   No current facility-administered medications on file prior to visit.     BP (!) 110/52   Temp 97.9 F (36.6 C)   Wt 148 lb (67.1 kg)   BMI 21.24 kg/m       Objective:   Physical Exam  Constitutional: He is oriented to person, place, and time. He appears well-developed and well-nourished. No distress.  HENT:  Head: Normocephalic and atraumatic.  Right Ear: Hearing, tympanic membrane, external ear and ear canal normal.  Left Ear: Hearing, tympanic membrane, external ear and ear canal normal.  Nose: Nose normal. No mucosal edema or rhinorrhea. Right sinus exhibits no maxillary sinus tenderness and no frontal sinus tenderness. Left sinus exhibits no maxillary sinus tenderness and no frontal sinus tenderness.  Mouth/Throat: Uvula is midline, oropharynx is clear and moist and mucous membranes are normal.  + PND   Eyes: Pupils are equal, round, and reactive to light. Conjunctivae and EOM are normal.  Cardiovascular: Normal rate, regular rhythm and intact distal pulses. Exam reveals no gallop and no friction rub.  Murmur heard. Pulmonary/Chest: Effort normal and breath  sounds normal. No stridor. No respiratory distress. He has no wheezes. He has no rales. He exhibits no tenderness.  Musculoskeletal: Normal range of motion. He exhibits no edema, tenderness or deformity.  Neurological: He is alert and oriented to person, place, and time.  Skin: Skin is warm and dry. Capillary refill takes less than  2 seconds. He is not diaphoretic.  Psychiatric: He has a normal mood and affect. His behavior is normal. Judgment and thought content normal.  Nursing note and vitals reviewed.     Assessment & Plan:  1. Viral sinusitis - ok to stop OTC decongestant  - Start Flonase - Follow up if no improvement in the next 3-4 days   Dorothyann Peng, NP

## 2018-04-04 DIAGNOSIS — Z23 Encounter for immunization: Secondary | ICD-10-CM | POA: Diagnosis not present

## 2018-04-04 DIAGNOSIS — L570 Actinic keratosis: Secondary | ICD-10-CM | POA: Diagnosis not present

## 2018-05-29 ENCOUNTER — Encounter: Payer: Self-pay | Admitting: Family Medicine

## 2018-05-29 ENCOUNTER — Ambulatory Visit (INDEPENDENT_AMBULATORY_CARE_PROVIDER_SITE_OTHER): Payer: Medicare Other | Admitting: Family Medicine

## 2018-05-29 VITALS — BP 124/78 | HR 64 | Temp 97.9°F | Wt 148.1 lb

## 2018-05-29 DIAGNOSIS — R040 Epistaxis: Secondary | ICD-10-CM | POA: Diagnosis not present

## 2018-05-29 NOTE — Progress Notes (Signed)
   Subjective:    Patient ID: JAICION LAURIE, male    DOB: 1936/04/13, 83 y.o.   MRN: [id number]  HPI Here for 10 days of nosebleeds on the left side. There is no soreness. Using saline nasal sprays.    Review of Systems  Constitutional: Negative.   HENT: Positive for nosebleeds. Negative for congestion, postnasal drip, rhinorrhea, sinus pressure and sinus pain.   Eyes: Negative.   Respiratory: Negative.   Hematological: Does not bruise/bleed easily.       Objective:   Physical Exam Constitutional:      Appearance: Normal appearance.  HENT:     Right Ear: Tympanic membrane and ear canal normal.     Left Ear: Tympanic membrane and ear canal normal.     Nose:     Comments: There is a small blood clot in the left anterior nostril     Mouth/Throat:     Pharynx: Oropharynx is clear.  Eyes:     Conjunctiva/sclera: Conjunctivae normal.  Pulmonary:     Effort: Pulmonary effort is normal.     Breath sounds: Normal breath sounds.  Lymphadenopathy:     Cervical: No cervical adenopathy.  Neurological:     Mental Status: He is alert.           Assessment & Plan:  Nosebleeds. He may need cautery. Refer to ENT. Alysia Penna, MD

## 2018-06-01 DIAGNOSIS — Z7289 Other problems related to lifestyle: Secondary | ICD-10-CM | POA: Diagnosis not present

## 2018-06-01 DIAGNOSIS — R040 Epistaxis: Secondary | ICD-10-CM | POA: Diagnosis not present

## 2018-06-01 DIAGNOSIS — J343 Hypertrophy of nasal turbinates: Secondary | ICD-10-CM | POA: Diagnosis not present

## 2018-06-01 DIAGNOSIS — Z87891 Personal history of nicotine dependence: Secondary | ICD-10-CM | POA: Diagnosis not present

## 2018-07-06 DIAGNOSIS — M25562 Pain in left knee: Secondary | ICD-10-CM | POA: Diagnosis not present

## 2018-07-06 DIAGNOSIS — M7042 Prepatellar bursitis, left knee: Secondary | ICD-10-CM | POA: Diagnosis not present

## 2018-09-24 ENCOUNTER — Telehealth: Payer: Self-pay

## 2018-09-24 NOTE — Telephone Encounter (Signed)
Left message for patient to call back. Need to change office visit to virtual visit and get consent.

## 2018-09-25 NOTE — Telephone Encounter (Signed)
    COVID-19 Pre-Screening Questions:  . In the past 7 to 10 days have you had a cough,  shortness of breath, headache, congestion, fever, body aches, chills, sore throat, or sudden loss of taste or sense of smell? NO . Have you been around anyone with known Covid 19. NO . Have you been around anyone who is awaiting Covid 19 test results in the past 7 to 10 days? NO . Have you been around anyone who has been exposed to Covid 19, or has mentioned symptoms of Covid 19 within the past 7 to 10 days? NO  If you have any concerns about symptoms your patients report please contact your leadership team, or the provider the patient is seeing in the office for further guidance.    Patient having an echo done on same day as office visit. Screened patient so he can come in for office visit and echo.

## 2018-09-25 NOTE — Progress Notes (Signed)
CARDIOLOGY OFFICE NOTE  Date:  09/25/2018    Norton Pastel Albin Date of Birth: 08/09/1935 Medical Record #[id number]  PCP:  Laurey Morale, MD  Cardiologist:  Andrez Grime chief complaint on file.   History of Present Illness: [name] [name] is a 83 y.o. male  f/u CAD.     He has a hx of CAD status post DES to the RCA in 2009, HTN, & HLD History of bradycardia on beta blocker associated with dizziness . Event monitor was arranged. Apparently, according to the notes in the chart, this demonstrated NSR, PACs and no significant arrhythmia.  Myovue 09/30/16 old inferior MI no ischemia EF 56% low risk Echo 09/15/17 EF normal mean gradient 15 mmHg mild AS peak 32 mmHg  Carotid: 09/22/16 Plaque no stenosis f/u May 2020  Daughter works at [employer] currently  Reviewed echo from today mean gradient 18 mmHg peak velocity 2.7 m/sec mild AS And normal EF   Had uneventful right TKR with Dr Maureen Ralphs on October 30 2017   No cardiac symptoms   Past Medical History:  Diagnosis Date  . Abscess of groin, left   . Arthritis    R hip & knees  . Cancer ([place])    basal cell, history of   . Closed fracture of right foot    a. 08/2014 Fx of R fifth metatarsal (Jones Fx).  . Coronary artery disease    a. 11/2007 Cath/PCI: LM nl, LAD 40-50m, d1 40-50ost, LCX nl, RCA 90d (Platinum Study DES - promus), EF 60%.  . Gastroesophageal reflux disease with hiatal hernia   . H/O hiatal hernia   . Hx MRSA infection   . Hypertension   . Pre-syncope    a. 08/2014 w/ sinus bradycardia in 40's in ED.    Past Surgical History:  Procedure Laterality Date  . APPENDECTOMY    . CARDIAC CATHETERIZATION  12/06/07   60%  . COLONOSCOPY    . CORONARY ANGIOPLASTY WITH STENT PLACEMENT  11/2007  . HERNIA REPAIR    . KNEE ARTHROSCOPY Right 10/03/2012   Procedure: RIGHT KNEEE ARTHROSCOPY WITH DEBRIDEMENT ;  Surgeon: Gearlean Alf, MD;  Location: [facility];  Service: Orthopedics;  Laterality:  Right;  . SHOULDER SURGERY  05/2016   rotator cuff and ligament repair   . TOTAL HIP ARTHROPLASTY Right 04/05/2013   Procedure: RIGHT TOTAL HIP ARTHROPLASTY ANTERIOR APPROACH;  Surgeon: Gearlean Alf, MD;  Location: [facility];  Service: Orthopedics;  Laterality: Right;  . TOTAL KNEE ARTHROPLASTY Right 10/30/2017   Procedure: RIGHT TOTAL KNEE ARTHROPLASTY;  Surgeon: Gaynelle Arabian, MD;  Location: [facility];  Service: Orthopedics;  Laterality: Right;  . YAG LASER APPLICATION     for glaucoma - both eyes, no drops as of yet      Medications: Current Outpatient Medications  Medication Sig Dispense Refill  . aspirin (ASPIR-LOW) 81 MG EC tablet Take 81 mg by mouth daily. Swallow whole.    . benazepril-hydrochlorthiazide (LOTENSIN HCT) 20-25 MG tablet TAKE 1 TABLET BY MOUTH EVERY DAY IN THE MORNING 90 tablet 2  . cholecalciferol (VITAMIN D) 1000 UNITS tablet Take 1,000 Units by mouth daily.     . Multiple Vitamin (MULTIVITAMIN WITH MINERALS) TABS tablet Take 1 tablet by mouth daily with breakfast.    . nitroGLYCERIN (NITROSTAT) 0.4 MG SL tablet Place 0.4 mg under the tongue every 5 (five) minutes as needed for chest pain (3 doses MAX).    Marland Kitchen  Probiotic Product (PROBIOTIC PO) Take 1 capsule by mouth daily with breakfast.    . rosuvastatin (CRESTOR) 20 MG tablet TAKE 1 TABLET EVERY OTHER DAY 45 tablet 3   No current facility-administered medications for this visit.     Allergies: No Known Allergies  Social History: The patient  reports that he quit smoking about 57 years ago. He has never used smokeless tobacco. He reports current alcohol use. He reports that he does not use drugs.   Family History: The patient's family history includes Heart disease in his unknown relative; Hypertension in his brother, sister, and unknown relative; Stomach cancer in his father.   Review of Systems: Please see the history of present illness.   Otherwise, the review of systems is positive for none.   All other  systems are reviewed and negative.   Physical Exam: VS:  There were no vitals taken for this visit. Marland Kitchen  BMI There is no height or weight on file to calculate BMI.  Wt Readings from Last 3 Encounters:  05/29/18 67.2 kg  04/02/18 67.1 kg  03/29/18 68.7 kg    Affect appropriate Healthy:  appears stated age 82: normal Neck supple with no adenopathy JVP normal no bruits no thyromegaly Lungs clear with no wheezing and good diaphragmatic motion Heart:  S1/S2 mild  AS  murmur, no rub, gallop or click PMI normal Abdomen: benighn, BS positve, no tenderness, no AAA no bruit.  No HSM or HJR Distal pulses intact with no bruits No edema Neuro non-focal Skin warm and dry Post right TKR     LABORATORY DATA:  EKG:   09/16/16  SR wendebach 3:2 10/03/18 SR rate 66 PR 248 msec PVC otherwise noraml   Lab Results  Component Value Date   WBC 13.0 (H) 11/01/2017   HGB 13.5 11/01/2017   HCT 38.3 (L) 11/01/2017   PLT 317 11/01/2017   GLUCOSE 102 (H) 11/01/2017   CHOL 142 09/11/2017   TRIG 101.0 09/11/2017   HDL 61.80 09/11/2017   LDLCALC 60 09/11/2017   ALT 26 10/19/2017   AST 25 10/19/2017   NA 141 11/01/2017   K 3.8 11/01/2017   CL 107 11/01/2017   CREATININE 0.78 11/01/2017   BUN 16 11/01/2017   CO2 25 11/01/2017   TSH 1.73 09/11/2017   PSA 0.66 11/30/2009   INR 1.00 10/19/2017   HGBA1C  12/07/2007    5.5 (NOTE)   The ADA recommends the following therapeutic goal for glycemic   control related to Hgb A1C measurement:   Goal of Therapy:   < 7.0% Hgb A1C   Reference: American Diabetes Association: Clinical Practice   Recommendations 2008, Diabetes Care,  2008, 31:(Suppl 1).    BNP (last 3 results) No results for input(s): BNP in the last 8760 hours.  ProBNP (last 3 results) No results for input(s): PROBNP in the last 8760 hours.   Other Studies Reviewed Today:  Echo 09/15/17 Study Conclusions  - Left ventricle: The cavity size was normal. Systolic function was   normal.  The estimated ejection fraction was in the range of 55%   to 60%. Wall motion was normal; there were no regional wall   motion abnormalities. There was an increased relative   contribution of atrial contraction to ventricular filling.   Doppler parameters are consistent with abnormal left ventricular   relaxation (grade 1 diastolic dysfunction). - Aortic valve: Valve mobility was restricted. There was mild to   moderate stenosis. Mean gradient (S): 13 mm Hg.  Peak gradient   (S): 32 mm Hg. Valve area (VTI): 1.74 cm^2. - Mitral valve: Calcified annulus. There was trivial regurgitation. - Right ventricle: The cavity size was mildly dilated. Wall   thickness was normal. - Right atrium: The atrium was mildly dilated. - Pulmonic valve: There was trivial regurgitation.  LHC 11/2007 LM: Normal LAD: Proximal to mid 40-50%, ostial D1 40-50% LCx: Normal RCA: Distal 90% EF 60% PCI: DES to the RCA    Assessment/Plan:  CAD:  No angina.  Continue ASA, ACE inhibitor, statin. Non ischemic myovue May 5681 no complications with general anesthesia and knee surgery June 2019   Benign essential HTN:  Well controlled.  Continue current medications and low sodium Dash type diet.    Hyperlipidemia:  Continue statin labs with primary   Bradycardia:  History of AV block and wenckebach beta blocker stopped  No indication for pacer evaluation   Murmur:  AS mild stable discussed future TAVR echo in a year   Bruit:  Right side ASA  [place] palque no stenosis likely transmitted AS murmur   Ortho:  Post right TKR 10/30/17 Dr Maureen Ralphs improved    Disposition:   f/u in a year with echo    Jenkins Rouge

## 2018-09-25 NOTE — Telephone Encounter (Signed)
Left second message for patient to call back.

## 2018-09-25 NOTE — Telephone Encounter (Signed)
Patient returned your call.

## 2018-10-03 ENCOUNTER — Ambulatory Visit (HOSPITAL_COMMUNITY): Payer: Medicare Other | Attending: Cardiovascular Disease

## 2018-10-03 ENCOUNTER — Other Ambulatory Visit: Payer: Self-pay

## 2018-10-03 ENCOUNTER — Ambulatory Visit: Payer: Medicare Other | Admitting: Cardiovascular Disease

## 2018-10-03 ENCOUNTER — Encounter: Payer: Self-pay | Admitting: Cardiovascular Disease

## 2018-10-03 ENCOUNTER — Ambulatory Visit (INDEPENDENT_AMBULATORY_CARE_PROVIDER_SITE_OTHER): Payer: Medicare Other | Admitting: Cardiovascular Disease

## 2018-10-03 VITALS — BP 118/66 | HR 66 | Ht 70.0 in | Wt 147.0 lb

## 2018-10-03 DIAGNOSIS — E785 Hyperlipidemia, unspecified: Secondary | ICD-10-CM

## 2018-10-03 DIAGNOSIS — I10 Essential (primary) hypertension: Secondary | ICD-10-CM

## 2018-10-03 DIAGNOSIS — R001 Bradycardia, unspecified: Secondary | ICD-10-CM | POA: Diagnosis not present

## 2018-10-03 DIAGNOSIS — I2510 Atherosclerotic heart disease of native coronary artery without angina pectoris: Secondary | ICD-10-CM | POA: Diagnosis not present

## 2018-10-03 DIAGNOSIS — I350 Nonrheumatic aortic (valve) stenosis: Secondary | ICD-10-CM | POA: Diagnosis not present

## 2018-10-03 DIAGNOSIS — R011 Cardiac murmur, unspecified: Secondary | ICD-10-CM | POA: Diagnosis not present

## 2018-10-03 MED ORDER — BENAZEPRIL-HYDROCHLOROTHIAZIDE 20-25 MG PO TABS: ORAL_TABLET | ORAL | 3 refills | Status: DC

## 2018-10-03 NOTE — Patient Instructions (Addendum)
Medication Instructions:   If you need a refill on your cardiac medications before your next appointment, please call your pharmacy.   Lab work:  If you have labs (blood work) drawn today and your tests are completely normal, you will receive your results only by: Marland Kitchen MyChart Message (if you have MyChart) OR . A paper copy in the mail If you have any lab test that is abnormal or we need to change your treatment, we will call you to review the results.  Testing/Procedures: Your physician has requested that you have an echocardiogram in 1 year. Echocardiography is a painless test that uses sound waves to create images of your heart. It provides your doctor with information about the size and shape of your heart and how well your heart's chambers and valves are working. This procedure takes approximately one hour. There are no restrictions for this procedure.  Follow-Up: At [facility], you and your health needs are our priority.  As part of our continuing mission to provide you with exceptional heart care, we have created designated Provider Care Teams.  These Care Teams include your primary Cardiologist (physician) and Advanced Practice Providers (APPs -  Physician Assistants and Nurse Practitioners) who all work together to provide you with the care you need, when you need it. You will need a follow up appointment in 12 months.  Please call our office 2 months in advance to schedule this appointment.  You may see Jenkins Rouge, MD or one of the following Advanced Practice Providers on your designated Care Team:   Truitt Merle, NP Cecilie Kicks, NP . Kathyrn Drown, NP

## 2018-10-18 DIAGNOSIS — M1711 Unilateral primary osteoarthritis, right knee: Secondary | ICD-10-CM | POA: Diagnosis not present

## 2018-10-24 ENCOUNTER — Other Ambulatory Visit: Payer: Self-pay | Admitting: Cardiovascular Disease

## 2018-11-10 ENCOUNTER — Encounter (HOSPITAL_COMMUNITY): Payer: Self-pay

## 2018-11-10 ENCOUNTER — Emergency Department (HOSPITAL_COMMUNITY)
Admission: EM | Admit: 2018-11-10 | Discharge: 2018-11-10 | Disposition: A | Discharge status: Home / Self Care | Payer: Medicare Other | Attending: Emergency Medicine | Admitting: Emergency Medicine

## 2018-11-10 ENCOUNTER — Other Ambulatory Visit: Payer: Self-pay

## 2018-11-10 DIAGNOSIS — Y9389 Activity, other specified: Secondary | ICD-10-CM | POA: Diagnosis not present

## 2018-11-10 DIAGNOSIS — Z87891 Personal history of nicotine dependence: Secondary | ICD-10-CM | POA: Insufficient documentation

## 2018-11-10 DIAGNOSIS — Z7982 Long term (current) use of aspirin: Secondary | ICD-10-CM | POA: Diagnosis not present

## 2018-11-10 DIAGNOSIS — Z79899 Other long term (current) drug therapy: Secondary | ICD-10-CM | POA: Insufficient documentation

## 2018-11-10 DIAGNOSIS — I2510 Atherosclerotic heart disease of native coronary artery without angina pectoris: Secondary | ICD-10-CM | POA: Diagnosis not present

## 2018-11-10 DIAGNOSIS — S0122XA Laceration with foreign body of nose, initial encounter: Secondary | ICD-10-CM | POA: Diagnosis not present

## 2018-11-10 DIAGNOSIS — Z85828 Personal history of other malignant neoplasm of skin: Secondary | ICD-10-CM | POA: Insufficient documentation

## 2018-11-10 DIAGNOSIS — Y33XXXA Other specified events, undetermined intent, initial encounter: Secondary | ICD-10-CM | POA: Diagnosis not present

## 2018-11-10 DIAGNOSIS — S0181XA Laceration without foreign body of other part of head, initial encounter: Secondary | ICD-10-CM | POA: Insufficient documentation

## 2018-11-10 DIAGNOSIS — Y92512 Supermarket, store or market as the place of occurrence of the external cause: Secondary | ICD-10-CM | POA: Diagnosis not present

## 2018-11-10 DIAGNOSIS — Y999 Unspecified external cause status: Secondary | ICD-10-CM | POA: Insufficient documentation

## 2018-11-10 DIAGNOSIS — I10 Essential (primary) hypertension: Secondary | ICD-10-CM | POA: Diagnosis not present

## 2018-11-10 MED ORDER — SILVER NITRATE-POT NITRATE 75-25 % EX MISC
1.0000 | Freq: Once | CUTANEOUS | Status: AC
  Administered 2018-11-10: 1 via TOPICAL
  Filled 2018-11-10: qty 1

## 2018-11-10 NOTE — ED Triage Notes (Signed)
Pt reports about 2 hrs ago his nose started to bleed. Pt on 81mg  ASA daily, no other blood thinners. Bleeding coming from a small blood vessel on the right side of his nose. Pressure gauze applied due to nose still bleeding.

## 2018-11-10 NOTE — ED Provider Notes (Signed)
[facility] EMERGENCY DEPARTMENT Provider Note   CSN: [id number] Arrival date & time: 11/10/18  1230     History   Chief Complaint Chief Complaint  Patient presents with  . Facial Injury    HPI [name] [name] is a 83 y.o. male who presents with bleeding on the side of his nose.  The patient states that it is "his nose but not his nose".  He states he was at the store and was wearing a mask and he may have touched the mask and then he started to feel like there was bleeding coming from his nose.  He states he gets nosebleeds frequently.  When he took the mask off he noticed it was the outside of his nose that was bleeding he thinks he nicked a small blood vessel.  He was not able to control the bleeding and therefore came to the emergency department.  He is not on any blood thinners but does take a daily baby aspirin.     HPI  Past Medical History:  Diagnosis Date  . Abscess of groin, left   . Arthritis    R hip & knees  . Cancer ([place])    basal cell, history of   . Closed fracture of right foot    a. 08/2014 Fx of R fifth metatarsal (Jones Fx).  . Coronary artery disease    a. 11/2007 Cath/PCI: LM nl, LAD 40-71m, d1 40-50ost, LCX nl, RCA 90d (Platinum Study DES - promus), EF 60%.  . Gastroesophageal reflux disease with hiatal hernia   . H/O hiatal hernia   . Hx MRSA infection   . Hypertension   . Pre-syncope    a. 08/2014 w/ sinus bradycardia in 40's in ED.    Patient Active Problem List   Diagnosis Date Noted  . OA (osteoarthritis) of knee 10/30/2017  . Aortic valve stenosis 07/18/2017  . Chronic pain of right knee 07/18/2017  . Osteoarthritis of knee 05/31/2017  . Abscess of left groin 05/07/2016  . Pre-syncope   . Coronary artery disease   . OA (osteoarthritis) of hip 04/05/2013  . Lateral meniscal tear 10/03/2012  . CONTACT DERMATITIS 12/12/2008  . APHTHOUS ULCERS 09/22/2008  . BOILS, RECURRENT 02/18/2008  . Coronary atherosclerosis  12/10/2007  . Elevated lipids 02/28/2007  . Essential hypertension 02/28/2007    Past Surgical History:  Procedure Laterality Date  . APPENDECTOMY    . CARDIAC CATHETERIZATION  12/06/07   60%  . COLONOSCOPY    . CORONARY ANGIOPLASTY WITH STENT PLACEMENT  11/2007  . HERNIA REPAIR    . KNEE ARTHROSCOPY Right 10/03/2012   Procedure: RIGHT KNEEE ARTHROSCOPY WITH DEBRIDEMENT ;  Surgeon: Gearlean Alf, MD;  Location[facility];  Service: Orthopedics;  Laterality: Right;  . SHOULDER SURGERY  05/2016   rotator cuff and ligament repair   . TOTAL HIP ARTHROPLASTY Right 04/05/2013   Procedure: RIGHT TOTAL HIP ARTHROPLASTY ANTERIOR APPROACH;  Surgeon: Gearlean Alf, MD;  Location: [facility];  Service: Orthopedics;  Laterality: Right;  . TOTAL KNEE ARTHROPLASTY Right 10/30/2017   Procedure: RIGHT TOTAL KNEE ARTHROPLASTY;  Surgeon: Gaynelle Arabian, MD;  [facility];  Service: Orthopedics;  Laterality: Right;  . YAG LASER APPLICATION     for glaucoma - both eyes, no drops as of yet         Home Medications    Prior to Admission medications   Medication Sig Start Date End Date Taking? Authorizing Provider  aspirin (ASPIR-LOW)  81 MG EC tablet Take 81 mg by mouth daily. Swallow whole.    [provider]  benazepril-hydrochlorthiazide (LOTENSIN HCT) 20-25 MG tablet TAKE 1 TABLET BY MOUTH EVERY DAY IN THE MORNING 10/03/18   [name], MD  cholecalciferol (VITAMIN D) 1000 UNITS tablet Take 1,000 Units by mouth daily.     [provider]  Multiple Vitamin (MULTIVITAMIN WITH MINERALS) TABS tablet Take 1 tablet by mouth daily with breakfast.    [provider]  nitroGLYCERIN (NITROSTAT) 0.4 MG SL tablet Place 0.4 mg under the tongue every 5 (five) minutes as needed for chest pain (3 doses MAX).    [provider]  Probiotic Product (PROBIOTIC PO) Take 1 capsule by mouth daily with breakfast.    [provider]  rosuvastatin (CRESTOR) 20 MG tablet TAKE 1  TABLET BY MOUTH EVERY OTHER DAY 10/25/18   [name], MD    Family History Family History  Problem Relation Age of Onset  . Hypertension Sister   . Hypertension Brother   . Stomach cancer Father        family hx  . Hypertension Other        family hx  . Heart disease Other        family hx  . Heart attack Neg Hx   . Stroke Neg Hx     Social History Social History   Tobacco Use  . Smoking status: Former Smoker    Quit date: 09/28/1961    Years since quitting: 57.1  . Smokeless tobacco: Never Used  Substance Use Topics  . Alcohol use: Yes    Comment: occ  . Drug use: No     Allergies   Patient has no known allergies.   Review of Systems Review of Systems  Skin: Positive for wound.  Hematological: Does not bruise/bleed easily.     Physical Exam Updated Vital Signs BP (!) 155/82   Pulse 72   Temp 98.6 F (37 C) (Oral)   Resp 14   Ht 5\' 10"  (1.778 m)   Wt 68 kg   SpO2 97%   BMI 21.52 kg/m   Physical Exam Vitals signs and nursing note reviewed.  Constitutional:      General: He is not in acute distress.    Appearance: Normal appearance. He is well-developed. He is not ill-appearing.     Comments: Calm, cooperative. Pleasant  HENT:     Head: Normocephalic and atraumatic.     Nose:     Comments: Small, healed over wound over the right side of the nose. No active bleeding. No signs of epistaxis Eyes:     General: No scleral icterus.       Right eye: No discharge.        Left eye: No discharge.     Conjunctiva/sclera: Conjunctivae normal.     Pupils: Pupils are equal, round, and reactive to light.  Neck:     Musculoskeletal: Normal range of motion.  Cardiovascular:     Rate and Rhythm: Normal rate.  Pulmonary:     Effort: Pulmonary effort is normal. No respiratory distress.  Abdominal:     General: There is no distension.  Skin:    General: Skin is warm and dry.  Neurological:     Mental Status: He is alert and oriented to person, place,  and time.  Psychiatric:        Behavior: Behavior normal.      ED Treatments / Results  Labs (all labs ordered are listed, but only abnormal results are displayed) Labs Reviewed - No data to display  EKG    Radiology No results found.  Procedures Procedures (including critical care time)  Medications Ordered in ED Medications  silver nitrate applicators applicator 1 Stick (has no administration in time range)     Initial Impression / Assessment and Plan / ED Course  I have reviewed the triage vital signs and the nursing notes.  Pertinent labs & imaging results that were available during my care of the patient were reviewed by me and considered in my medical decision making (see chart for details).  83 year old male presents with a small cut to the outer right side of the nose.  There is no active bleeding on my initial exam.  Patient is somewhat anxious about the prospect of it bleeding again.  Silver nitrate stick was rolled over the area and Dermabond was applied.  He was given instructions on wound care.  He is advised to hold pressure if the area starts to bleed again. Shared visit with Dr. Sedonia Small.  Return precautions given  Final Clinical Impressions(s) / ED Diagnoses   Final diagnoses:  Cut of face    ED Discharge Orders    None       Recardo Evangelist, PA-C 11/10/18 1421    Maudie Flakes, MD 11/14/18 [phone]

## 2018-11-10 NOTE — ED Notes (Signed)
Patient Alert and oriented to baseline. Stable and ambulatory to baseline. Patient verbalized understanding of the discharge instructions.  Patient belongings were taken by the patient.   

## 2018-11-10 NOTE — Discharge Instructions (Signed)
Keep area clean and dry for at least 24 hours You can put an extra band-aid over the area if you want extra protection but do not put any soap, lotion, or ointment on it Hold pressure on the area if it bleeds again Return if worsening

## 2018-12-17 ENCOUNTER — Ambulatory Visit (INDEPENDENT_AMBULATORY_CARE_PROVIDER_SITE_OTHER): Payer: Medicare Other | Admitting: Family Medicine

## 2018-12-17 ENCOUNTER — Encounter: Payer: Self-pay | Admitting: Family Medicine

## 2018-12-17 VITALS — BP 132/60 | HR 74 | Temp 97.7°F | Wt 152.6 lb

## 2018-12-17 DIAGNOSIS — L02421 Furuncle of right axilla: Secondary | ICD-10-CM | POA: Diagnosis not present

## 2018-12-17 DIAGNOSIS — I2510 Atherosclerotic heart disease of native coronary artery without angina pectoris: Secondary | ICD-10-CM

## 2018-12-17 NOTE — Progress Notes (Signed)
   Subjective:    Patient ID: [name] [name], male    DOB: Sep 21, 1935, 83 y.o.   MRN: [id number]  HPI Here to check a lump in the right arm pit that came up a week ago. It was never painful. He has been applying hot compresses to it, and now it is getting smaller again. He feels fine in general.    Review of Systems  Constitutional: Negative.   Respiratory: Negative.   Cardiovascular: Negative.        Objective:   Physical Exam Constitutional:      Appearance: Normal appearance.  Cardiovascular:     Rate and Rhythm: Normal rate and regular rhythm.     Pulses: Normal pulses.     Heart sounds: Normal heart sounds.  Pulmonary:     Effort: Pulmonary effort is normal.     Breath sounds: Normal breath sounds.  Skin:    Comments: There is a small non-tender boil in the right axilla. No adenopathy   Neurological:     Mental Status: He is alert.           Assessment & Plan:  Boil. This seems to be responding to warm compresses, so he will continue with these and recheck only if needed. Alysia Penna, MD

## 2019-01-09 ENCOUNTER — Encounter: Payer: Self-pay | Admitting: Family Medicine

## 2019-01-09 ENCOUNTER — Ambulatory Visit (INDEPENDENT_AMBULATORY_CARE_PROVIDER_SITE_OTHER): Payer: Medicare Other | Admitting: Family Medicine

## 2019-01-09 VITALS — BP 130/70 | HR 64 | Temp 97.2°F | Wt 149.2 lb

## 2019-01-09 DIAGNOSIS — I350 Nonrheumatic aortic (valve) stenosis: Secondary | ICD-10-CM | POA: Diagnosis not present

## 2019-01-09 DIAGNOSIS — M25561 Pain in right knee: Secondary | ICD-10-CM

## 2019-01-09 DIAGNOSIS — N138 Other obstructive and reflux uropathy: Secondary | ICD-10-CM | POA: Diagnosis not present

## 2019-01-09 DIAGNOSIS — I10 Essential (primary) hypertension: Secondary | ICD-10-CM | POA: Diagnosis not present

## 2019-01-09 DIAGNOSIS — E785 Hyperlipidemia, unspecified: Secondary | ICD-10-CM | POA: Diagnosis not present

## 2019-01-09 DIAGNOSIS — I2510 Atherosclerotic heart disease of native coronary artery without angina pectoris: Secondary | ICD-10-CM | POA: Diagnosis not present

## 2019-01-09 DIAGNOSIS — G8929 Other chronic pain: Secondary | ICD-10-CM

## 2019-01-09 DIAGNOSIS — N401 Enlarged prostate with lower urinary tract symptoms: Secondary | ICD-10-CM | POA: Diagnosis not present

## 2019-01-09 DIAGNOSIS — Z23 Encounter for immunization: Secondary | ICD-10-CM

## 2019-01-09 LAB — PSA: PSA: 1.78 ng/mL (ref 0.10–4.00)

## 2019-01-09 LAB — BASIC METABOLIC PANEL
BUN: 23 mg/dL (ref 6–23)
CO2: 26 mEq/L (ref 19–32)
Calcium: 10 mg/dL (ref 8.4–10.5)
Chloride: 97 mEq/L (ref 96–112)
Creatinine, Ser: 1 mg/dL (ref 0.40–1.50)
GFR: 71.64 mL/min (ref >60.00)
Glucose, Bld: 99 mg/dL (ref 70–99)
Potassium: 4.7 mEq/L (ref 3.5–5.1)
Sodium: 133 mEq/L — ABNORMAL LOW (ref 135–145)

## 2019-01-09 LAB — CBC WITH DIFFERENTIAL/PLATELET
Basophils Absolute: 0 10*3/uL (ref 0.0–0.1)
Basophils Relative: 0.4 % (ref 0.0–3.0)
Eosinophils Absolute: 0.2 10*3/uL (ref 0.0–0.7)
Eosinophils Relative: 2.7 % (ref 0.0–5.0)
HCT: 47.5 % (ref 39.0–52.0)
Hemoglobin: 16.4 g/dL (ref 13.0–17.0)
Lymphocytes Relative: 16.5 % (ref 12.0–46.0)
Lymphs Abs: 1.1 10*3/uL (ref 0.7–4.0)
MCHC: 34.6 g/dL (ref 30.0–36.0)
MCV: 98.7 fl (ref 78.0–100.0)
Monocytes Absolute: 1.1 10*3/uL — ABNORMAL HIGH (ref 0.1–1.0)
Monocytes Relative: 17.3 % — ABNORMAL HIGH (ref 3.0–12.0)
Neutro Abs: 4 10*3/uL (ref 1.4–7.7)
Neutrophils Relative %: 63.1 % (ref 43.0–77.0)
Platelets: 430 10*3/uL — ABNORMAL HIGH (ref 150.0–400.0)
RBC: 4.81 Mil/uL (ref 4.22–5.81)
RDW: 13.5 % (ref 11.5–15.5)
WBC: 6.4 10*3/uL (ref 4.0–10.5)

## 2019-01-09 LAB — LIPID PANEL
Cholesterol: 149 mg/dL (ref 0–200)
HDL: 70.1 mg/dL (ref >39.00)
LDL Cholesterol: 57 mg/dL (ref 0–99)
NonHDL: 79.3
Total CHOL/HDL Ratio: 2
Triglycerides: 110 mg/dL (ref 0.0–149.0)
VLDL: 22 mg/dL (ref 0.0–40.0)

## 2019-01-09 LAB — HEPATIC FUNCTION PANEL
ALT: 19 U/L (ref 0–53)
AST: 22 U/L (ref 0–37)
Albumin: 4.8 g/dL (ref 3.5–5.2)
Alkaline Phosphatase: 62 U/L (ref 39–117)
Bilirubin, Direct: 0.2 mg/dL (ref 0.0–0.3)
Total Bilirubin: 1.2 mg/dL (ref 0.2–1.2)
Total Protein: 7.2 g/dL (ref 6.0–8.3)

## 2019-01-09 LAB — TSH: TSH: 1.83 u[IU]/mL (ref 0.35–4.50)

## 2019-01-09 NOTE — Progress Notes (Signed)
Subjective:    Patient ID: [name] [name], male    DOB: 1935-05-31, 83 y.o.   MRN: [id number]  HPI Here to follow up on issues. He feels great and has no concerns. He plays golf twice a week, and he is proud to report he shot his age last week (83). He saw Dr. Johnsie Cancel in May, and his CAD is stable. They are following his stenotic aortic valve. His knees bother him but he remains active. BP is stable.    Review of Systems  Constitutional: Negative.   HENT: Negative.   Eyes: Negative.   Respiratory: Negative.   Cardiovascular: Negative.   Gastrointestinal: Negative.   Genitourinary: Negative.   Musculoskeletal: Positive for arthralgias.  Skin: Negative.   Neurological: Negative.   Psychiatric/Behavioral: Negative.        Objective:   Physical Exam Constitutional:      General: He is not in acute distress.    Appearance: He is well-developed. He is not diaphoretic.  HENT:     Head: Normocephalic and atraumatic.     Right Ear: External ear normal.     Left Ear: External ear normal.     Nose: Nose normal.     Mouth/Throat:     Pharynx: No oropharyngeal exudate.  Eyes:     General: No scleral icterus.       Right eye: No discharge.        Left eye: No discharge.     Conjunctiva/sclera: Conjunctivae normal.     Pupils: Pupils are equal, round, and reactive to light.  Neck:     Musculoskeletal: Neck supple.     Thyroid: No thyromegaly.     Vascular: No JVD.     Trachea: No tracheal deviation.  Cardiovascular:     Rate and Rhythm: Normal rate and regular rhythm.     Heart sounds: No friction rub. No gallop.      Comments: Stable 2/6 SM  Pulmonary:     Effort: Pulmonary effort is normal. No respiratory distress.     Breath sounds: Normal breath sounds. No wheezing or rales.  Chest:     Chest wall: No tenderness.  Abdominal:     General: Bowel sounds are normal. There is no distension.     Palpations: Abdomen is soft. There is no mass.     Tenderness: There is no  abdominal tenderness. There is no guarding or rebound.  Genitourinary:    Penis: Normal. No tenderness.      Scrotum/Testes: Normal.     Prostate: Normal.     Rectum: Normal. Guaiac result negative.  Musculoskeletal: Normal range of motion.        General: No tenderness.  Lymphadenopathy:     Cervical: No cervical adenopathy.  Skin:    General: Skin is warm and dry.     Coloration: Skin is not pale.     Findings: No erythema or rash.  Neurological:     Mental Status: He is alert and oriented to person, place, and time.     Cranial Nerves: No cranial nerve deficit.     Motor: No abnormal muscle tone.     Coordination: Coordination normal.     Deep Tendon Reflexes: Reflexes are normal and symmetric. Reflexes normal.  Psychiatric:        Behavior: Behavior normal.        Thought Content: Thought content normal.        Judgment: Judgment normal.  Assessment & Plan:  His HTN and CAD are stable. Osteoarthritis is stable. His aortic valve stenosis is followed by Dr. Johnsie Cancel every 6 months. We will get fasting labs today to check lipids, etc.  Alysia Penna, MD

## 2019-01-11 ENCOUNTER — Telehealth: Payer: Self-pay

## 2019-01-11 DIAGNOSIS — L570 Actinic keratosis: Secondary | ICD-10-CM | POA: Diagnosis not present

## 2019-01-11 DIAGNOSIS — C44622 Squamous cell carcinoma of skin of right upper limb, including shoulder: Secondary | ICD-10-CM | POA: Diagnosis not present

## 2019-01-11 DIAGNOSIS — C4442 Squamous cell carcinoma of skin of scalp and neck: Secondary | ICD-10-CM | POA: Diagnosis not present

## 2019-01-11 DIAGNOSIS — C44629 Squamous cell carcinoma of skin of left upper limb, including shoulder: Secondary | ICD-10-CM | POA: Diagnosis not present

## 2019-01-11 DIAGNOSIS — L821 Other seborrheic keratosis: Secondary | ICD-10-CM | POA: Diagnosis not present

## 2019-01-11 DIAGNOSIS — D1730 Benign lipomatous neoplasm of skin and subcutaneous tissue of unspecified sites: Secondary | ICD-10-CM | POA: Diagnosis not present

## 2019-01-11 DIAGNOSIS — D485 Neoplasm of uncertain behavior of skin: Secondary | ICD-10-CM | POA: Diagnosis not present

## 2019-01-11 DIAGNOSIS — Z85828 Personal history of other malignant neoplasm of skin: Secondary | ICD-10-CM | POA: Diagnosis not present

## 2019-01-11 NOTE — Telephone Encounter (Signed)
Spoke with patients wife. She is aware all labs were normal and will relay the message.

## 2019-01-11 NOTE — Telephone Encounter (Signed)
Copied from [place] [id number]. Topic: General - Inquiry >> Jan 10, 2019  3:20 PM Richardo Priest, [place] wrote: Reason for CRM: Patient called in stating he is wanting a nurse to give a call in regards to his most resent lab results. Stated he is unaware he was activated for mychart and does not understand how system works. Please advise.

## 2019-02-04 DIAGNOSIS — C4442 Squamous cell carcinoma of skin of scalp and neck: Secondary | ICD-10-CM | POA: Diagnosis not present

## 2019-02-27 DIAGNOSIS — C44622 Squamous cell carcinoma of skin of right upper limb, including shoulder: Secondary | ICD-10-CM | POA: Diagnosis not present

## 2019-03-13 DIAGNOSIS — C44622 Squamous cell carcinoma of skin of right upper limb, including shoulder: Secondary | ICD-10-CM | POA: Diagnosis not present

## 2019-03-29 DIAGNOSIS — H02831 Dermatochalasis of right upper eyelid: Secondary | ICD-10-CM | POA: Diagnosis not present

## 2019-03-29 DIAGNOSIS — H18413 Arcus senilis, bilateral: Secondary | ICD-10-CM | POA: Diagnosis not present

## 2019-03-29 DIAGNOSIS — Z961 Presence of intraocular lens: Secondary | ICD-10-CM | POA: Diagnosis not present

## 2019-03-29 DIAGNOSIS — H18513 Endothelial corneal dystrophy, bilateral: Secondary | ICD-10-CM | POA: Diagnosis not present

## 2019-05-29 ENCOUNTER — Ambulatory Visit: Payer: Medicare Other | Attending: Internal Medicine

## 2019-05-29 DIAGNOSIS — Z23 Encounter for immunization: Secondary | ICD-10-CM | POA: Diagnosis not present

## 2019-05-29 NOTE — Progress Notes (Signed)
   Covid-19 Vaccination Clinic  Name:  [name] [name]    MRN: [id number] DOB: Feb 13, 1936  05/29/2019  [name] was observed post Covid-19 immunization for 15 minutes without incidence. He was provided with Vaccine Information Sheet and instruction to access the V-Safe system.   [name] was instructed to call 911 with any severe reactions post vaccine: Marland Kitchen Difficulty breathing  . Swelling of your face and throat  . A fast heartbeat  . A bad rash all over your body  . Dizziness and weakness    Immunizations Administered    Name Date Dose VIS Date Route   Pfizer COVID-19 Vaccine 05/29/2019  8:42 AM 0.3 mL 04/26/2019 Intramuscular   Manufacturer: [place]   Lot: [id number]   [place]: [id number]

## 2019-06-18 ENCOUNTER — Ambulatory Visit: Payer: Medicare Other | Attending: Internal Medicine

## 2019-06-18 DIAGNOSIS — Z23 Encounter for immunization: Secondary | ICD-10-CM | POA: Insufficient documentation

## 2019-06-18 NOTE — Progress Notes (Signed)
   Covid-19 Vaccination Clinic  Name:  [name] [name]    MRN: [id number] DOB: [birth year]  06/18/2019  Mr. Seiger was observed post Covid-19 immunization for 15 minutes without incidence. He was provided with Vaccine Information Sheet and instruction to access the V-Safe system.   Mr. Hawryluk was instructed to call 911 with any severe reactions post vaccine: Marland Kitchen Difficulty breathing  . Swelling of your face and throat  . A fast heartbeat  . A bad rash all over your body  . Dizziness and weakness    Immunizations Administered    Name Date Dose VIS Date Route   Pfizer COVID-19 Vaccine 06/18/2019  8:15 AM 0.3 mL 04/26/2019 Intramuscular   Manufacturer: [place]   Lot: [id number]   [place]: [id number]

## 2019-07-12 DIAGNOSIS — L578 Other skin changes due to chronic exposure to nonionizing radiation: Secondary | ICD-10-CM | POA: Diagnosis not present

## 2019-07-12 DIAGNOSIS — L570 Actinic keratosis: Secondary | ICD-10-CM | POA: Diagnosis not present

## 2019-07-12 DIAGNOSIS — D485 Neoplasm of uncertain behavior of skin: Secondary | ICD-10-CM | POA: Diagnosis not present

## 2019-07-12 DIAGNOSIS — Z85828 Personal history of other malignant neoplasm of skin: Secondary | ICD-10-CM | POA: Diagnosis not present

## 2019-07-12 DIAGNOSIS — C44229 Squamous cell carcinoma of skin of left ear and external auricular canal: Secondary | ICD-10-CM | POA: Diagnosis not present

## 2019-08-01 DIAGNOSIS — C44229 Squamous cell carcinoma of skin of left ear and external auricular canal: Secondary | ICD-10-CM | POA: Diagnosis not present

## 2019-09-23 ENCOUNTER — Telehealth: Payer: Self-pay | Admitting: *Deleted

## 2019-09-23 NOTE — Telephone Encounter (Signed)
Patient will keep appointment like it is already scheduled.

## 2019-09-23 NOTE — Telephone Encounter (Signed)
Follow up   Patient is returning your call. Please call on cell #.

## 2019-09-23 NOTE — Telephone Encounter (Signed)
Patient come into the office this morning and stated that he was told to come in and check on his appointments because they are scheduled for next week on the "wrong dates". I looked to see when appointments are scheduled. Echo is Wednesday 5/19 and appointment with Dr. Johnsie Cancel is [facility]. 10/04/19. Patient stated that he knows "when they are.  He was instructed I will need to do Covid screening and check to see about sending him up. He stated that "he doesn't have time, and I will do it some other way" with an attitude. He wanted to be able to go straight upstairs without stopping at the screening table.

## 2019-09-23 NOTE — Telephone Encounter (Signed)
His echo isn't schedule till 10:00 and I will be gone by the time they are done with it as reader. Can reschedule both for a day where he can have echo and see me right after

## 2019-09-23 NOTE — Telephone Encounter (Signed)
Tried to call patient back on both phone numbers listed. Left message for patient to call back.

## 2019-09-23 NOTE — Telephone Encounter (Signed)
Called patient back. Patient wanted to see if what time to arrive so he would not be late for his appointments next week. Since patient is having echo on Wednesday will see if Dr. Johnsie Cancel can see him after echo since we are in the office that morning. Informed patient that we would call him back to let him know.

## 2019-09-25 NOTE — Progress Notes (Signed)
CARDIOLOGY OFFICE NOTE  Date:  10/04/2019    [name] Date of Birth: [birth year] Medical Record [id number]  PCP:  Laurey Morale, MD  Cardiologist:  Andrez Grime chief complaint on file.   History of Present Illness: [name] [name] is a 84 y.o. male  f/u CAD.     He has a hx of CAD status post DES to the RCA in 2009, HTN, & HLD History of bradycardia on beta blocker associated with dizziness . Event monitor was arranged. Apparently, according to the notes in the chart, this demonstrated NSR, PACs and no significant arrhythmia.  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 Abscess of groin, left   . Arthritis    R hip & knees  . Cancer ([place])    basal cell, history of   . Closed fracture of right foot    a. 08/2014 Fx of R fifth metatarsal (Jones Fx).  . Coronary artery disease    a. 11/2007 Cath/PCI: LM nl, LAD 40-46m, d1 40-50ost, LCX nl, RCA 90d (Platinum Study DES - promus), EF 60%.  . Gastroesophageal reflux disease with hiatal hernia   . H/O hiatal hernia   . Hx MRSA infection   . Hypertension   . Pre-syncope    a. 08/2014 w/ sinus bradycardia in 40's in ED.    Past Surgical History:  Procedure Laterality Date  . APPENDECTOMY    . CARDIAC CATHETERIZATION  12/06/07   60%  . COLONOSCOPY  06/08/2015   per Dr. Watt Climes, no polyps   . CORONARY ANGIOPLASTY WITH STENT PLACEMENT  11/2007  . HERNIA REPAIR    . KNEE ARTHROSCOPY Right 10/03/2012   Procedure: RIGHT KNEEE  ARTHROSCOPY WITH DEBRIDEMENT ;  Surgeon: Gearlean Alf, MD;  Location: [facility];  Service: Orthopedics;  Laterality: Right;  . SHOULDER SURGERY  05/2016   rotator cuff and ligament repair   . TOTAL HIP ARTHROPLASTY Right 04/05/2013   Procedure: RIGHT TOTAL HIP ARTHROPLASTY ANTERIOR APPROACH;  Surgeon: Gearlean Alf, MD;  Location: [place];  Service: Orthopedics;  Laterality: Right;  . TOTAL KNEE ARTHROPLASTY Right 10/30/2017   Procedure: RIGHT TOTAL KNEE ARTHROPLASTY;  Surgeon: Gaynelle Arabian, MD;  Location: [facility];  Service: Orthopedics;  Laterality: Right;  . YAG LASER APPLICATION     for glaucoma - both eyes, no drops as of yet      Medications: Current Outpatient Medications  Medication Sig Dispense Refill  . aspirin (ASPIR-LOW) 81 MG EC tablet Take 81 mg by mouth daily. Swallow whole.    . benazepril-hydrochlorthiazide (LOTENSIN HCT) 20-25 MG tablet TAKE 1 TABLET BY MOUTH EVERY DAY IN THE MORNING 90 tablet 3  . cholecalciferol (VITAMIN D) 1000 UNITS tablet Take 1,000 Units by mouth daily.     . Multiple Vitamin (MULTIVITAMIN WITH MINERALS) TABS tablet Take 1 tablet by mouth daily with breakfast.    . nitroGLYCERIN (NITROSTAT) 0.4  MG SL tablet Place 0.4 mg under the tongue every 5 (five) minutes as needed for chest pain (3 doses MAX).    . Probiotic Product (PROBIOTIC PO) Take 1 capsule by mouth daily with breakfast.    . rosuvastatin (CRESTOR) 20 MG tablet TAKE 1 TABLET BY MOUTH EVERY OTHER DAY 45 tablet 3   No current facility-administered medications for this visit.    Allergies: No Known Allergies  Social History: The patient  reports that he quit smoking about 58 years ago. He has never used smokeless tobacco. He reports current alcohol use. He reports that he does not use drugs.   Family History: The patient's family history includes Heart disease in an other family member; Hypertension in his brother, sister, and another family member; Stomach cancer in his father.    Review of Systems: Please see the history of present illness.   Otherwise, the review of systems is positive for none.   All other systems are reviewed and negative.   Physical Exam: VS:  BP 100/72   Pulse 66   Ht 5\' 10"  (1.778 m)   Wt 144 lb (65.3 kg)   SpO2 98%   BMI 20.66 kg/m  .  BMI Body mass index is 20.66 kg/m.  Wt Readings from Last 3 Encounters:  10/04/19 144 lb (65.3 kg)  01/09/19 149 lb 3.2 oz (67.7 kg)  12/17/18 152 lb 9.6 oz (69.2 kg)    Affect appropriate Healthy:  appears stated age HEENT: normal Neck supple with no adenopathy JVP normal no bruits no thyromegaly Lungs clear with no wheezing and good diaphragmatic motion Heart:  S1/S2 mild  AS  murmur, no rub, gallop or click PMI normal Abdomen: benighn, BS positve, no tenderness, no AAA no bruit.  No HSM or HJR Distal pulses intact with no bruits No edema Neuro non-focal Skin warm and dry Post right TKR     LABORATORY DATA:  EKG:   10/04/19 NSR normal ECG   Lab Results  Component Value Date   WBC 6.4 01/09/2019   HGB 16.4 01/09/2019   HCT 47.5 01/09/2019   PLT 430.0 (H) 01/09/2019   GLUCOSE 99 01/09/2019   CHOL 149 01/09/2019   TRIG 110.0 01/09/2019   HDL 70.10 01/09/2019   LDLCALC 57 01/09/2019   ALT 19 01/09/2019   AST 22 01/09/2019   NA 133 (L) 01/09/2019   K 4.7 01/09/2019   CL 97 01/09/2019   CREATININE 1.00 01/09/2019   BUN 23 01/09/2019   CO2 26 01/09/2019   TSH 1.83 01/09/2019   PSA 1.78 01/09/2019   INR 1.00 10/19/2017   HGBA1C  12/07/2007    5.5 (NOTE)   The ADA recommends the following therapeutic goal for glycemic   control related to Hgb A1C measurement:   Goal of Therapy:   < 7.0% Hgb A1C   Reference: American Diabetes Association: Clinical Practice   Recommendations 2008, Diabetes Care,  2008, 31:(Suppl 1).    BNP (last 3 results) No results for input(s): BNP in the last 8760 hours.  ProBNP (last 3 results) No results for input(s): PROBNP in the last 8760  hours.   Other Studies Reviewed Today:  Echo 10/03/18   EF 60-65% Mild AS  Mean gradient 14 mmhg peak 30 mmHg DVI 0.48 AVA 1.52 cm2    LHC 11/2007 LM: Normal LAD: Proximal to mid 40-50%, ostial D1 40-50% LCx: Normal RCA: Distal 90% EF 60% PCI: DES to the RCA    Assessment/Plan:  CAD:  No angina.  Continue ASA, ACE inhibitor, statin. Non ischemic myovue May [id number] no complications with general anesthesia and knee surgery June 2019   Benign essential HTN:  Well controlled.  Continue current medications and low sodium Dash type diet.    Hyperlipidemia:  Continue statin labs with primary   Bradycardia:  History of AV block and wenckebach beta blocker stopped  No indication for pacer evaluation   Murmur:  AS echo 10/02/19 mild mean gradient 17 mmHg f/u echo in a year   Bruit:  Right side ASA  [place] palque no stenosis likely transmitted AS murmur   Ortho:  Post right TKR 10/30/17 Dr Maureen Ralphs improved    Disposition:   f/u in a year with echo    Jenkins Rouge

## 2019-10-02 ENCOUNTER — Ambulatory Visit (HOSPITAL_COMMUNITY): Payer: Medicare Other | Attending: Cardiovascular Disease

## 2019-10-02 ENCOUNTER — Other Ambulatory Visit: Payer: Self-pay

## 2019-10-02 DIAGNOSIS — I350 Nonrheumatic aortic (valve) stenosis: Secondary | ICD-10-CM | POA: Diagnosis not present

## 2019-10-04 ENCOUNTER — Other Ambulatory Visit: Payer: Self-pay

## 2019-10-04 ENCOUNTER — Ambulatory Visit (INDEPENDENT_AMBULATORY_CARE_PROVIDER_SITE_OTHER): Payer: Medicare Other | Admitting: Cardiovascular Disease

## 2019-10-04 ENCOUNTER — Encounter: Payer: Self-pay | Admitting: Cardiovascular Disease

## 2019-10-04 VITALS — BP 100/72 | HR 66 | Ht 70.0 in | Wt 144.0 lb

## 2019-10-04 DIAGNOSIS — I350 Nonrheumatic aortic (valve) stenosis: Secondary | ICD-10-CM

## 2019-10-04 DIAGNOSIS — I2510 Atherosclerotic heart disease of native coronary artery without angina pectoris: Secondary | ICD-10-CM | POA: Diagnosis not present

## 2019-10-04 NOTE — Patient Instructions (Addendum)
Medication Instructions:  *If you need a refill on your cardiac medications before your next appointment, please call your pharmacy*  Lab Work: If you have labs (blood work) drawn today and your tests are completely normal, you will receive your results only by: . MyChart Message (if you have MyChart) OR . A paper copy in the mail If you have any lab test that is abnormal or we need to change your treatment, we will call you to review the results.  Testing/Procedures: Your physician has requested that you have an echocardiogram. Echocardiography is a painless test that uses sound waves to create images of your heart. It provides your doctor with information about the size and shape of your heart and how well your heart's chambers and valves are working. This procedure takes approximately one hour. There are no restrictions for this procedure.  Follow-Up: At [facility], you and your health needs are our priority.  As part of our continuing mission to provide you with exceptional heart care, we have created designated Provider Care Teams.  These Care Teams include your primary Cardiologist (physician) and Advanced Practice Providers (APPs -  Physician Assistants and Nurse Practitioners) who all work together to provide you with the care you need, when you need it.  We recommend signing up for the patient portal called "MyChart".  Sign up information is provided on this After Visit Summary.  MyChart is used to connect with patients for Virtual Visits (Telemedicine).  Patients are able to view lab/test results, encounter notes, upcoming appointments, etc.  Non-urgent messages can be sent to your provider as well.   To learn more about what you can do with MyChart, go to [email or web address].com.    Your next appointment:   12 month(s)  The format for your next appointment:   In Person  Provider:   You may see Peter Nishan, MD or one of the following Advanced Practice Providers on your  designated Care Team:    Lori Gerhardt, NP  Laura Ingold, NP  Jill McDaniel, NP  

## 2019-10-13 ENCOUNTER — Other Ambulatory Visit: Payer: Self-pay | Admitting: Cardiovascular Disease

## 2020-01-01 DIAGNOSIS — L568 Other specified acute skin changes due to ultraviolet radiation: Secondary | ICD-10-CM | POA: Diagnosis not present

## 2020-01-06 ENCOUNTER — Encounter: Payer: Self-pay | Admitting: Family Medicine

## 2020-01-06 ENCOUNTER — Other Ambulatory Visit: Payer: Self-pay

## 2020-01-06 ENCOUNTER — Ambulatory Visit (INDEPENDENT_AMBULATORY_CARE_PROVIDER_SITE_OTHER): Payer: Medicare Other | Admitting: Family Medicine

## 2020-01-06 VITALS — BP 102/70 | HR 71 | Temp 98.7°F | Wt 145.2 lb

## 2020-01-06 DIAGNOSIS — R590 Localized enlarged lymph nodes: Secondary | ICD-10-CM | POA: Diagnosis not present

## 2020-01-06 DIAGNOSIS — I2510 Atherosclerotic heart disease of native coronary artery without angina pectoris: Secondary | ICD-10-CM

## 2020-01-06 NOTE — Progress Notes (Signed)
   Subjective:    Patient ID: [name] [name], male    DOB: 25-Feb-1936, 84 y.o.   MRN: [id number]  HPI Here for 2 days of swollen lymph nodes below the jaw. Last week (4 days ago) he had cryotherapy to the lower lip at his dermatologist's office, and of course this caused the lip to blister up. Then he noticed the lymph nodes come up. These are slightly tender. Otherwise he feels fine, no ST or fever or cough.    Review of Systems  Constitutional: Negative.   HENT: Negative for congestion, postnasal drip, sinus pressure, sore throat and trouble swallowing.   Eyes: Negative.   Respiratory: Negative.   Cardiovascular: Negative.   Hematological: Positive for adenopathy.       Objective:   Physical Exam Constitutional:      Appearance: Normal appearance.  HENT:     Right Ear: Tympanic membrane, ear canal and external ear normal.     Left Ear: Tympanic membrane, ear canal and external ear normal.     Nose: Nose normal.     Mouth/Throat:     Comments: The lower lip is scabbed and crusty. The interior of the mouth is clear  Eyes:     Conjunctiva/sclera: Conjunctivae normal.  Neck:     Comments: There are several small enlarged nodes beneath both sides of the lower jaw  Neurological:     Mental Status: He is alert.           Assessment & Plan:  These lymph nodes have reacted to the irritation on the lower lip. I reassured him this was benign and they should resolve over th next week or two. Recheck prn. Alysia Penna, MD

## 2020-01-08 ENCOUNTER — Encounter: Payer: Self-pay | Admitting: Family Medicine

## 2020-01-09 ENCOUNTER — Ambulatory Visit (INDEPENDENT_AMBULATORY_CARE_PROVIDER_SITE_OTHER): Payer: Medicare Other | Admitting: Family Medicine

## 2020-01-09 ENCOUNTER — Encounter: Payer: Self-pay | Admitting: Family Medicine

## 2020-01-09 ENCOUNTER — Other Ambulatory Visit: Payer: Self-pay

## 2020-01-09 VITALS — BP 108/60 | HR 75 | Temp 98.2°F | Wt 144.6 lb

## 2020-01-09 DIAGNOSIS — R600 Localized edema: Secondary | ICD-10-CM

## 2020-01-09 DIAGNOSIS — I2510 Atherosclerotic heart disease of native coronary artery without angina pectoris: Secondary | ICD-10-CM | POA: Diagnosis not present

## 2020-01-09 DIAGNOSIS — J36 Peritonsillar abscess: Secondary | ICD-10-CM

## 2020-01-09 MED ORDER — AMOXICILLIN-POT CLAVULANATE 875-125 MG PO TABS: 1.0000 | ORAL_TABLET | Freq: Two times a day (BID) | ORAL | 0 refills | Status: DC

## 2020-01-09 MED ORDER — CEFTRIAXONE SODIUM 1 G IJ SOLR
1.0000 g | Freq: Once | INTRAMUSCULAR | Status: AC
  Administered 2020-01-09: 1 g via INTRAMUSCULAR

## 2020-01-09 NOTE — Progress Notes (Signed)
   Subjective:    Patient ID: [name] [name], male    DOB: Feb 08, 1936, 84 y.o.   MRN: [id number]  HPI Here for continued swelling of nodes under the jaw and now with voice changes. We saw him on 01-06-20 after he had a cryotherapy treatment to the lower lip. We assumed the lymph nodes were reacting to the lip trauma. Since then the lip has been healing nicely, but he has had a little more discomfort in the left neck area and his voice has been thickened. No ST or fever. No rashes. He has taken some Ibuprofen.    Review of Systems  Constitutional: Negative.   HENT: Positive for trouble swallowing and voice change. Negative for postnasal drip, sinus pressure, sinus pain and sore throat.   Eyes: Negative.   Respiratory: Negative.        Objective:   Physical Exam Constitutional:      General: He is not in acute distress.    Appearance: Normal appearance.     Comments: His voice is very thick   HENT:     Right Ear: Tympanic membrane, ear canal and external ear normal.     Left Ear: Tympanic membrane, ear canal and external ear normal.     Nose: Nose normal.     Mouth/Throat:     Comments: There is swelling from an abscess behind the left palatine tonsil. No erythema or exudate. The lower lip is healing well Eyes:     Conjunctiva/sclera: Conjunctivae normal.  Neck:     Comments: Enlarged tender nodes in the left AC region  Pulmonary:     Effort: Pulmonary effort is normal.     Breath sounds: Normal breath sounds.  Neurological:     Mental Status: He is alert.           Assessment & Plan:  Peritonsillar abscess. We need to treat this aggressively. He is given a shot of Rocephin and he will begin 10 days of Augmentin BID. I asked to see him again next Monday. If this does not respond by then, we may need to refer to ENT for a possible surgery. Alysia Penna, MD

## 2020-01-10 ENCOUNTER — Other Ambulatory Visit: Payer: Self-pay | Admitting: Cardiovascular Disease

## 2020-01-10 ENCOUNTER — Other Ambulatory Visit: Payer: Self-pay

## 2020-01-10 NOTE — Telephone Encounter (Signed)
He was seen here and was given antibiotics. We will see him again on Monday.

## 2020-01-10 NOTE — Telephone Encounter (Signed)
Noted. Nothing further needed. 

## 2020-01-13 ENCOUNTER — Ambulatory Visit (INDEPENDENT_AMBULATORY_CARE_PROVIDER_SITE_OTHER): Payer: Medicare Other | Admitting: Family Medicine

## 2020-01-13 ENCOUNTER — Other Ambulatory Visit: Payer: Self-pay

## 2020-01-13 ENCOUNTER — Encounter: Payer: Self-pay | Admitting: Family Medicine

## 2020-01-13 VITALS — BP 100/60 | HR 78 | Temp 97.9°F | Wt 142.0 lb

## 2020-01-13 DIAGNOSIS — I2510 Atherosclerotic heart disease of native coronary artery without angina pectoris: Secondary | ICD-10-CM | POA: Diagnosis not present

## 2020-01-13 DIAGNOSIS — J36 Peritonsillar abscess: Secondary | ICD-10-CM | POA: Diagnosis not present

## 2020-01-13 NOTE — Progress Notes (Signed)
   Subjective:    Patient ID: [name] [name], male    DOB: [birth year], 84 y.o.   MRN: [id number]  HPI Here to follow up on a left peritonsillar abscess. We saw him last week and gave him a shot of Rocephin. He was also started on Augmentin. The day after we saw him it sounds like the abscess opened and drained, because he describes spitting a lot of bloody pus out of his mouth. Since then he has felt dramatically better, the pain and swelling are down, and he can eat comfortably.    Review of Systems  Constitutional: Negative.   HENT: Positive for sore throat. Negative for congestion, postnasal drip, sinus pressure and trouble swallowing.   Eyes: Negative.   Respiratory: Negative.        Objective:   Physical Exam Constitutional:      Appearance: Normal appearance. He is not ill-appearing.  HENT:     Right Ear: Tympanic membrane, ear canal and external ear normal.     Left Ear: Tympanic membrane, ear canal and external ear normal.     Nose: Nose normal.     Mouth/Throat:     Comments: The left tonsillar area is still red but is no longer swollen like before  Eyes:     Conjunctiva/sclera: Conjunctivae normal.  Neck:     Comments: The left AC adenopathy has resolved  Pulmonary:     Effort: Pulmonary effort is normal.     Breath sounds: Normal breath sounds.  Neurological:     Mental Status: He is alert.           Assessment & Plan:  Peritonsillar abscess, partially healed. He is past the critical stage and he seems to be healing nicely. He will finish out the Augmentin. Recheck as needed.  Alysia Penna, MD

## 2020-01-29 DIAGNOSIS — Z23 Encounter for immunization: Secondary | ICD-10-CM | POA: Diagnosis not present

## 2020-02-04 ENCOUNTER — Ambulatory Visit: Payer: Medicare Other

## 2020-02-17 DIAGNOSIS — Z23 Encounter for immunization: Secondary | ICD-10-CM | POA: Diagnosis not present

## 2020-02-19 DIAGNOSIS — D1730 Benign lipomatous neoplasm of skin and subcutaneous tissue of unspecified sites: Secondary | ICD-10-CM | POA: Diagnosis not present

## 2020-02-19 DIAGNOSIS — Z85828 Personal history of other malignant neoplasm of skin: Secondary | ICD-10-CM | POA: Diagnosis not present

## 2020-02-19 DIAGNOSIS — L821 Other seborrheic keratosis: Secondary | ICD-10-CM | POA: Diagnosis not present

## 2020-02-19 DIAGNOSIS — L578 Other skin changes due to chronic exposure to nonionizing radiation: Secondary | ICD-10-CM | POA: Diagnosis not present

## 2020-02-19 DIAGNOSIS — D485 Neoplasm of uncertain behavior of skin: Secondary | ICD-10-CM | POA: Diagnosis not present

## 2020-02-19 DIAGNOSIS — W57XXXA Bitten or stung by nonvenomous insect and other nonvenomous arthropods, initial encounter: Secondary | ICD-10-CM | POA: Diagnosis not present

## 2020-02-19 DIAGNOSIS — B009 Herpesviral infection, unspecified: Secondary | ICD-10-CM | POA: Diagnosis not present

## 2020-02-19 DIAGNOSIS — L570 Actinic keratosis: Secondary | ICD-10-CM | POA: Diagnosis not present

## 2020-02-19 DIAGNOSIS — C44319 Basal cell carcinoma of skin of other parts of face: Secondary | ICD-10-CM | POA: Diagnosis not present

## 2020-02-28 DIAGNOSIS — C44319 Basal cell carcinoma of skin of other parts of face: Secondary | ICD-10-CM | POA: Diagnosis not present

## 2020-03-11 DIAGNOSIS — C44319 Basal cell carcinoma of skin of other parts of face: Secondary | ICD-10-CM | POA: Diagnosis not present

## 2020-03-27 DIAGNOSIS — H02831 Dermatochalasis of right upper eyelid: Secondary | ICD-10-CM | POA: Diagnosis not present

## 2020-03-27 DIAGNOSIS — H18413 Arcus senilis, bilateral: Secondary | ICD-10-CM | POA: Diagnosis not present

## 2020-03-27 DIAGNOSIS — H18513 Endothelial corneal dystrophy, bilateral: Secondary | ICD-10-CM | POA: Diagnosis not present

## 2020-03-27 DIAGNOSIS — Z961 Presence of intraocular lens: Secondary | ICD-10-CM | POA: Diagnosis not present

## 2020-05-26 ENCOUNTER — Telehealth: Payer: Self-pay | Admitting: Family Medicine

## 2020-05-26 NOTE — Telephone Encounter (Signed)
Left message for patient to call back and schedule Medicare Annual Wellness Visit (AWV) either virtually or in office.   Last AWV no information please schedule at anytime with [facility] Nurse Health Advisor 1 or 2   This should be a 45 minute visit. 

## 2020-07-15 ENCOUNTER — Telehealth: Payer: Self-pay | Admitting: Cardiovascular Disease

## 2020-07-15 DIAGNOSIS — I350 Nonrheumatic aortic (valve) stenosis: Secondary | ICD-10-CM

## 2020-07-15 NOTE — Telephone Encounter (Signed)
Order placed for echo. Scheduled patient for office visit and echo same day in May.

## 2020-07-15 NOTE — Telephone Encounter (Signed)
Wife of patient called. She tried to schedule the patient's yearly f/u and Echo at the same time, but there are no orders in the patient's chart for the echo.  Please place order for echo

## 2020-07-27 ENCOUNTER — Telehealth: Payer: Self-pay | Admitting: Family Medicine

## 2020-07-27 NOTE — Telephone Encounter (Signed)
Left message for patient to call back and schedule Medicare Annual Wellness Visit (AWV) either virtually or in office. No detailed message left   Last AWV no information  please schedule at anytime with [facility] Nurse Health Advisor 1 or 2   This should be a 45 minute visit. 

## 2020-09-27 ENCOUNTER — Other Ambulatory Visit: Payer: Self-pay | Admitting: Cardiovascular Disease

## 2020-09-30 NOTE — Progress Notes (Signed)
CARDIOLOGY OFFICE NOTE  Date:  09/30/2020    [name] [name] Date of Birth: 05/04/1936 Medical Record #[id number]  PCP:  Laurey Morale, MD  Cardiologist:  Andrez Grime chief complaint on file.   History of Present Illness:  85 y.o. history of CAD with DES to RCA 2009. CRF;s HTN, HLD SSS with bradycardia beta blockers avoided    Myovue 09/30/16 old inferior MI no ischemia EF 56% low risk Echo 10/02/19 mean gradient 17 peak 33 mmHg DVI 0.36  Carotid: 09/22/16 Plaque no stenosis f/u May 2020  Daughter works at [employer]  2nd in charge   Had uneventful right TKR with Dr Maureen Ralphs on October 30 2017  Peritonsillar abscess August 2021   Echo today fairly stable peak velocity 71m/sec gradients 20/36 mmHg DVI 0.32 and AVA 1.1 cm2   No angina active with grandson   Past Medical History:  Diagnosis Date  . Abscess of groin, left   . Arthritis    R hip & knees  . Cancer ([place])    basal cell, history of   . Closed fracture of right foot    a. 08/2014 Fx of R fifth metatarsal (Jones Fx).  . Coronary artery disease    a. 11/2007 Cath/PCI: LM nl, LAD 40-56m, d1 40-50ost, LCX nl, RCA 90d (Platinum Study DES - promus), EF 60%.  . Gastroesophageal reflux disease with hiatal hernia   . H/O hiatal hernia   . Hx MRSA infection   . Hypertension   . Pre-syncope    a. 08/2014 w/ sinus bradycardia in 40's in ED.    Past Surgical History:  Procedure Laterality Date  . APPENDECTOMY    . CARDIAC CATHETERIZATION  12/06/07   60%  . COLONOSCOPY  06/08/2015   per Dr. Watt Climes, no polyps   . CORONARY ANGIOPLASTY WITH STENT PLACEMENT  11/2007  . HERNIA REPAIR    . KNEE ARTHROSCOPY Right 10/03/2012   Procedure: RIGHT KNEEE ARTHROSCOPY WITH DEBRIDEMENT ;  Surgeon: Gearlean Alf, MD;  Location: [facility];  Service: Orthopedics;  Laterality: Right;  . SHOULDER SURGERY  05/2016   rotator cuff and ligament repair   . TOTAL HIP ARTHROPLASTY Right 04/05/2013   Procedure: RIGHT TOTAL  HIP ARTHROPLASTY ANTERIOR APPROACH;  Surgeon: Gearlean Alf, MD;  Location: [place];  Service: Orthopedics;  Laterality: Right;  . TOTAL KNEE ARTHROPLASTY Right 10/30/2017   Procedure: RIGHT TOTAL KNEE ARTHROPLASTY;  Surgeon: Gaynelle Arabian, MD;  [facility];  Service: Orthopedics;  Laterality: Right;  . YAG LASER APPLICATION     for glaucoma - both eyes, no drops as of yet      Medications: Current Outpatient Medications  Medication Sig Dispense Refill  . amoxicillin-clavulanate (AUGMENTIN) 875-125 MG tablet Take 1 tablet by mouth 2 (two) times daily. 20 tablet 0  . aspirin (ASPIR-LOW) 81 MG EC tablet Take 81 mg by mouth daily. Swallow whole.    . benazepril-hydrochlorthiazide (LOTENSIN HCT) 20-25 MG tablet TAKE 1 TABLET BY MOUTH EVERY DAY IN THE MORNING 90 tablet 1  . cholecalciferol (VITAMIN D) 1000 UNITS tablet Take 1,000 Units by mouth daily.     . Multiple Vitamin (MULTIVITAMIN WITH MINERALS) TABS tablet Take 1 tablet by mouth daily with breakfast.    . nitroGLYCERIN (NITROSTAT) 0.4 MG SL tablet Place 0.4 mg under the tongue every 5 (five) minutes as needed for chest pain (3 doses MAX).    . Probiotic Product (PROBIOTIC PO) Take  1 capsule by mouth daily with breakfast.    . rosuvastatin (CRESTOR) 20 MG tablet TAKE 1 TABLET BY MOUTH EVERY OTHER DAY 45 tablet 2   No current facility-administered medications for this visit.    Allergies: No Known Allergies  Social History: The patient  reports that he quit smoking about 59 years ago. He has never used smokeless tobacco. He reports current alcohol use. He reports that he does not use drugs.   Family History: The patient's family history includes Heart disease in an other family member; Hypertension in his brother, sister, and another family member; Stomach cancer in his father.   Review of Systems: Please see the history of present illness.   Otherwise, the review of systems is positive for none.   All other systems are  reviewed and negative.   Physical Exam: VS:  There were no vitals taken for this visit. Marland Kitchen  BMI There is no height or weight on file to calculate BMI.  Wt Readings from Last 3 Encounters:  01/13/20 64.4 kg  01/09/20 65.6 kg  01/06/20 65.9 kg    Affect appropriate Healthy:  appears stated age 32: normal Neck supple with no adenopathy JVP normal no bruits no thyromegaly Lungs clear with no wheezing and good diaphragmatic motion Heart:  S1/S2 mild  AS  murmur, no rub, gallop or click PMI normal Abdomen: benighn, BS positve, no tenderness, no AAA no bruit.  No HSM or HJR Distal pulses intact with no bruits No edema Neuro non-focal Skin warm and dry Post right TKR     LABORATORY DATA:  EKG:   10/04/19 NSR normal ECG 10/07/2020 SR rate 52 normal   Lab Results  Component Value Date   WBC 6.4 01/09/2019   HGB 16.4 01/09/2019   HCT 47.5 01/09/2019   PLT 430.0 (H) 01/09/2019   GLUCOSE 99 01/09/2019   CHOL 149 01/09/2019   TRIG 110.0 01/09/2019   HDL 70.10 01/09/2019   LDLCALC 57 01/09/2019   ALT 19 01/09/2019   AST 22 01/09/2019   NA 133 (L) 01/09/2019   K 4.7 01/09/2019   CL 97 01/09/2019   CREATININE 1.00 01/09/2019   BUN 23 01/09/2019   CO2 26 01/09/2019   TSH 1.83 01/09/2019   PSA 1.78 01/09/2019   INR 1.00 10/19/2017   HGBA1C  12/07/2007    5.5 (NOTE)   The ADA recommends the following therapeutic goal for glycemic   control related to Hgb A1C measurement:   Goal of Therapy:   < 7.0% Hgb A1C   Reference: American Diabetes Association: Clinical Practice   Recommendations 2008, Diabetes Care,  2008, 31:(Suppl 1).    BNP (last 3 results) No results for input(s): BNP in the last 8760 hours.  ProBNP (last 3 results) No results for input(s): PROBNP in the last 8760 hours.   Other Studies Reviewed Today:  Echo 10/03/18   EF 60-65% Mild AS  Mean gradient 14 mmhg peak 30 mmHg DVI 0.48 AVA 1.52 cm2    LHC 11/2007 LM: Normal LAD: Proximal to mid 40-50%,  ostial D1 40-50% LCx: Normal RCA: Distal 90% EF 60% PCI: DES to the RCA    Assessment/Plan:  CAD:  Stable no angina medical Rx . Non ischemic myovue May 1749 no complications with general anesthesia and knee surgery June 2019   Benign essential HTN:  Well controlled.  Continue current medications and low sodium Dash type diet.    Hyperlipidemia:  Continue statin labs with primary Historically LDL <60  Bradycardia:  History of AV block and wenckebach avoid beta blocker no high Grade AV block or indication for PPM  Murmur:  AS echo 10/07/2020 stable moderate AS f/u TTE in a year will likely be considered for TAVR In 2 years   Bruit:  Right side ASA  [place] 2018 palque no stenosis likely transmitted AS murmur   Ortho:  Post right TKR 10/30/17 Dr Maureen Ralphs improved    Disposition:   f/u in a year with echo    Jenkins Rouge

## 2020-10-07 ENCOUNTER — Ambulatory Visit (HOSPITAL_COMMUNITY): Payer: Medicare Other | Attending: Cardiology

## 2020-10-07 ENCOUNTER — Other Ambulatory Visit: Payer: Self-pay

## 2020-10-07 ENCOUNTER — Ambulatory Visit (INDEPENDENT_AMBULATORY_CARE_PROVIDER_SITE_OTHER): Payer: Medicare Other | Admitting: Cardiovascular Disease

## 2020-10-07 ENCOUNTER — Other Ambulatory Visit: Payer: Self-pay | Admitting: *Deleted

## 2020-10-07 VITALS — BP 130/70 | HR 52 | Ht 71.0 in | Wt 143.8 lb

## 2020-10-07 DIAGNOSIS — I350 Nonrheumatic aortic (valve) stenosis: Secondary | ICD-10-CM

## 2020-10-07 DIAGNOSIS — I2510 Atherosclerotic heart disease of native coronary artery without angina pectoris: Secondary | ICD-10-CM | POA: Diagnosis not present

## 2020-10-07 DIAGNOSIS — R0789 Other chest pain: Secondary | ICD-10-CM | POA: Diagnosis not present

## 2020-10-07 LAB — ECHOCARDIOGRAM COMPLETE
AR max vel: 1.12 cm2
AV Area VTI: 1.01 cm2
AV Area mean vel: 1.04 cm2
AV Mean grad: 20 mmHg
AV Peak grad: 36 mmHg
Ao pk vel: 3 m/s
Area-P 1/2: 2.82 cm2
P 1/2 time: 364 msec
S' Lateral: 2.5 cm

## 2020-10-07 NOTE — Patient Instructions (Addendum)
Medication Instructions:  NO CHANGES *If you need a refill on your cardiac medications before your next appointment, please call your pharmacy*   Lab Work: NONE If you have labs (blood work) drawn today and your tests are completely normal, you will receive your results only by: Marland Kitchen MyChart Message (if you have MyChart) OR . A paper copy in the mail If you have any lab test that is abnormal or we need to change your treatment, we will call you to review the results.    Follow-Up: At [facility], you and your health needs are our priority.  As part of our continuing mission to provide you with exceptional heart care, we have created designated Provider Care Teams.  These Care Teams include your primary Cardiologist (physician) and Advanced Practice Providers (APPs -  Physician Assistants and Nurse Practitioners) who all work together to provide you with the care you need, when you need it.  We recommend signing up for the patient portal called "MyChart".  Sign up information is provided on this After Visit Summary.  MyChart is used to connect with patients for Virtual Visits (Telemedicine).  Patients are able to view lab/test results, encounter notes, upcoming appointments, etc.  Non-urgent messages can be sent to your provider as well.   To learn more about what you can do with MyChart, go to [email or web address].    Your next appointment:   12 month(s)  The format for your next appointment:   In Person  Provider:   Jenkins Rouge, MD   Other Instructions Your physician has requested that you have an echocardiogram. Echocardiography is a painless test that uses sound waves to create images of your heart. It provides your doctor with information about the size and shape of your heart and how well your heart's chambers and valves are working. This procedure takes approximately one hour. There are no restrictions for this procedure. DUE IN A YEAR 5/23 WITH APPT SAME DAY

## 2020-10-14 DIAGNOSIS — Z23 Encounter for immunization: Secondary | ICD-10-CM | POA: Diagnosis not present

## 2020-10-19 ENCOUNTER — Telehealth: Payer: Self-pay | Admitting: Family Medicine

## 2020-10-19 NOTE — Telephone Encounter (Signed)
Left message for patient to call back and schedule Medicare Annual Wellness Visit (AWV) either virtually or in office.   AWV-I per PALMETTO 05/16/09 please schedule at anytime with [facility] Nurse Health Advisor 1 or 2   This should be a 45 minute visit. 

## 2020-11-30 ENCOUNTER — Telehealth: Payer: Self-pay | Admitting: Family Medicine

## 2020-11-30 NOTE — Telephone Encounter (Signed)
Left message for patient to call back and schedule Medicare Annual Wellness Visit (AWV) either virtually or in office.   AWV-I per PALMETTO 05/16/09 please schedule at anytime with [facility] Nurse Health Advisor 1 or 2   This should be a 45 minute visit. 

## 2020-12-16 DIAGNOSIS — D225 Melanocytic nevi of trunk: Secondary | ICD-10-CM | POA: Diagnosis not present

## 2020-12-16 DIAGNOSIS — L570 Actinic keratosis: Secondary | ICD-10-CM | POA: Diagnosis not present

## 2020-12-16 DIAGNOSIS — Z85828 Personal history of other malignant neoplasm of skin: Secondary | ICD-10-CM | POA: Diagnosis not present

## 2020-12-16 DIAGNOSIS — L821 Other seborrheic keratosis: Secondary | ICD-10-CM | POA: Diagnosis not present

## 2020-12-16 DIAGNOSIS — L578 Other skin changes due to chronic exposure to nonionizing radiation: Secondary | ICD-10-CM | POA: Diagnosis not present

## 2020-12-29 ENCOUNTER — Telehealth: Payer: Self-pay | Admitting: Family Medicine

## 2020-12-29 NOTE — Telephone Encounter (Signed)
Left message for patient to call back and schedule Medicare Annual Wellness Visit (AWV) either virtually or in office.  Left both  my jabber number [phone] and office number    AWV-I per PALMETTO 05/16/09  please schedule at anytime with [facility] Nurse Health Advisor 1 or 2   This should be a 45 minute visit.

## 2021-02-15 DIAGNOSIS — Z23 Encounter for immunization: Secondary | ICD-10-CM | POA: Diagnosis not present

## 2021-03-13 DIAGNOSIS — S90112A Contusion of left great toe without damage to nail, initial encounter: Secondary | ICD-10-CM | POA: Diagnosis not present

## 2021-03-23 ENCOUNTER — Other Ambulatory Visit: Payer: Self-pay

## 2021-03-23 ENCOUNTER — Ambulatory Visit (INDEPENDENT_AMBULATORY_CARE_PROVIDER_SITE_OTHER): Payer: Medicare Other | Admitting: Family Medicine

## 2021-03-23 VITALS — BP 130/60 | HR 73 | Temp 97.8°F | Wt 144.1 lb

## 2021-03-23 DIAGNOSIS — I2510 Atherosclerotic heart disease of native coronary artery without angina pectoris: Secondary | ICD-10-CM | POA: Diagnosis not present

## 2021-03-23 DIAGNOSIS — L03031 Cellulitis of right toe: Secondary | ICD-10-CM

## 2021-03-23 DIAGNOSIS — S9031XD Contusion of right foot, subsequent encounter: Secondary | ICD-10-CM

## 2021-03-23 MED ORDER — CEPHALEXIN 500 MG PO CAPS: 500.0000 mg | ORAL_CAPSULE | Freq: Three times a day (TID) | ORAL | 0 refills | Status: DC

## 2021-03-23 NOTE — Progress Notes (Signed)
Established Patient Office Visit  Subjective:  Patient ID: [name] [name], male    DOB: [birth year]  Age: 85 y.o. MRN: [id number]  CC:  Chief Complaint  Patient presents with   Foot Pain    Inside of right, seen at ortho urgent care, told to ice it, no improvement, x 1&1/2 week    HPI General Wearing Mah presents for right foot and great toe pain.  He states that he injured this around 29 October.  He was walking and accidentally hit his foot into the edge of a door.  He has prominent bunion on that foot at baseline.  He went to an orthopedic urgent care and had x-rays which did not show any fracture.  He was instructed to ice this and apparently started on Keflex.  He has 1 day of Keflex left.  He still has some's considerable pain with ambulation but overall is improving some.  He states the swelling has gone down slightly and slightly less erythema.  No fevers or chills.  No known history of MRSA.  No adverse side effects from the Keflex.  No history of gout.  Past Medical History:  Diagnosis Date   Abscess of groin, left    Arthritis    R hip & knees   Cancer ([place])    basal cell, history of    Closed fracture of right foot    a. 08/2014 Fx of R fifth metatarsal ([facility] Fx).   Coronary artery disease    a. 11/2007 Cath/PCI: LM nl, LAD 40-104m, d1 40-50ost, LCX nl, RCA 90d (Platinum Study DES - promus), EF 60%.   Gastroesophageal reflux disease with hiatal hernia    H/O hiatal hernia    Hx MRSA infection    Hypertension    Pre-syncope    a. 08/2014 w/ sinus bradycardia in 40's in ED.    Past Surgical History:  Procedure Laterality Date   APPENDECTOMY     CARDIAC CATHETERIZATION  12/06/07   60%   COLONOSCOPY  06/08/2015   per Dr. Watt Climes, no polyps    CORONARY ANGIOPLASTY WITH STENT PLACEMENT  11/2007   HERNIA REPAIR     KNEE ARTHROSCOPY Right 10/03/2012   Procedure: RIGHT KNEEE ARTHROSCOPY WITH DEBRIDEMENT ;  Surgeon: Gearlean Alf, MD;  Location: [facility];  Service:  Orthopedics;  Laterality: Right;   SHOULDER SURGERY  05/2016   rotator cuff and ligament repair    TOTAL HIP ARTHROPLASTY Right 04/05/2013   Procedure: RIGHT TOTAL HIP ARTHROPLASTY ANTERIOR APPROACH;  Surgeon: Gearlean Alf, MD;  Location: [facility];  Service: Orthopedics;  Laterality: Right;   TOTAL KNEE ARTHROPLASTY Right 10/30/2017   Procedure: RIGHT TOTAL KNEE ARTHROPLASTY;  Surgeon: Gaynelle Arabian, MD;  Location: [facility];  Service: Orthopedics;  Laterality: Right;   YAG LASER APPLICATION     for glaucoma - both eyes, no drops as of yet     Family History  Problem Relation Age of Onset   Hypertension Sister    Hypertension Brother    Stomach cancer Father        family hx   Hypertension Other        family hx   Heart disease Other        family hx   Heart attack Neg Hx    Stroke Neg Hx     Social History   Socioeconomic History   Marital status: Married    Spouse name: Not on file   Number of children:  Not on file   Years of education: Not on file   Highest education level: Not on file  Occupational History   Not on file  Tobacco Use   Smoking status: Former    Types: Cigarettes    Quit date: 09/28/1961    Years since quitting: 59.5   Smokeless tobacco: Never  Vaping Use   Vaping Use: Never used  Substance and Sexual Activity   Alcohol use: Yes    Comment: occ   Drug use: No   Sexual activity: Not on file  Other Topics Concern   Not on file  Social History Narrative   Not on file   Social Determinants of Health   Financial Resource Strain: Not on file  Food Insecurity: Not on file  Transportation Needs: Not on file  Physical Activity: Not on file  Stress: Not on file  Social Connections: Not on file  Intimate Partner Violence: Not on file    Outpatient Medications Prior to Visit  Medication Sig Dispense Refill   aspirin 81 MG EC tablet Take 81 mg by mouth daily. Swallow whole.     benazepril-hydrochlorthiazide (LOTENSIN HCT) 20-25 MG tablet TAKE 1  TABLET BY MOUTH EVERY DAY IN THE MORNING 90 tablet 1   cholecalciferol (VITAMIN D) 1000 UNITS tablet Take 1,000 Units by mouth daily.      Multiple Vitamin (MULTIVITAMIN WITH MINERALS) TABS tablet Take 1 tablet by mouth daily with breakfast.     nitroGLYCERIN (NITROSTAT) 0.4 MG SL tablet Place 0.4 mg under the tongue every 5 (five) minutes as needed for chest pain (3 doses MAX).     Probiotic Product (PROBIOTIC PO) Take 1 capsule by mouth daily with breakfast.     rosuvastatin (CRESTOR) 20 MG tablet TAKE 1 TABLET BY MOUTH EVERY OTHER DAY 45 tablet 2   amoxicillin-clavulanate (AUGMENTIN) 875-125 MG tablet Take 1 tablet by mouth 2 (two) times daily. 20 tablet 0   No facility-administered medications prior to visit.    No Known Allergies  ROS Review of Systems  Constitutional:  Negative for chills and fever.     Objective:    Physical Exam Vitals reviewed.  Cardiovascular:     Rate and Rhythm: Normal rate.  Pulmonary:     Effort: Pulmonary effort is normal.     Breath sounds: Normal breath sounds.  Musculoskeletal:     Comments: Right foot reveals significant bunion.  He has some erythema at the MTP joint extending down to the distal aspect the great toe.  There is slight warmth.  Minimal tenderness.  No ulcerations.  Excellent capillary refill.  Neurological:     Mental Status: He is alert.    BP 130/60 (BP Location: Left Arm, Patient Position: Sitting, Cuff Size: Normal)   Pulse 73   Temp 97.8 F (36.6 C) (Oral)   Wt 144 lb 1.6 oz (65.4 kg)   SpO2 98%   BMI 20.10 kg/m  Wt Readings from Last 3 Encounters:  03/23/21 144 lb 1.6 oz (65.4 kg)  10/07/20 143 lb 12.8 oz (65.2 kg)  01/13/20 142 lb (64.4 kg)     Health Maintenance Due  Topic Date Due   Pneumonia Vaccine 22+ Years old (1 - PCV) Never done   TETANUS/TDAP  06/21/2017   COVID-19 Vaccine (3 - Booster for Pfizer series) 08/13/2019   Zoster Vaccines- Shingrix (2 of 2) 01/19/2020   INFLUENZA VACCINE  12/14/2020     There are no preventive care reminders to display for this patient.  Lab Results  Component Value Date   TSH 1.83 01/09/2019   Lab Results  Component Value Date   WBC 6.4 01/09/2019   HGB 16.4 01/09/2019   HCT 47.5 01/09/2019   MCV 98.7 01/09/2019   PLT 430.0 (H) 01/09/2019   Lab Results  Component Value Date   NA 133 (L) 01/09/2019   K 4.7 01/09/2019   CO2 26 01/09/2019   GLUCOSE 99 01/09/2019   BUN 23 01/09/2019   CREATININE 1.00 01/09/2019   BILITOT 1.2 01/09/2019   ALKPHOS 62 01/09/2019   AST 22 01/09/2019   ALT 19 01/09/2019   PROT 7.2 01/09/2019   ALBUMIN 4.8 01/09/2019   CALCIUM 10.0 01/09/2019   ANIONGAP 9 11/01/2017   GFR 71.64 01/09/2019   Lab Results  Component Value Date   CHOL 149 01/09/2019   Lab Results  Component Value Date   HDL 70.10 01/09/2019   Lab Results  Component Value Date   LDLCALC 57 01/09/2019   Lab Results  Component Value Date   TRIG 110.0 01/09/2019   Lab Results  Component Value Date   CHOLHDL 2 01/09/2019   Lab Results  Component Value Date   HGBA1C  12/07/2007    5.5 (NOTE)   The ADA recommends the following therapeutic goal for glycemic   control related to Hgb A1C measurement:   Goal of Therapy:   < 7.0% Hgb A1C   Reference: American Diabetes Association: Clinical Practice   Recommendations 2008, Diabetes Care,  2008, 31:(Suppl 1).      Assessment & Plan:   Foot pain.  Injury as above.  X-rays reportedly showed no fracture from orthopedics.  He does have prominent bunion and probably significant osteoarthritis changes of the MTP joint.  Suspect he has significant contusion but he does have erythema extending down to the distal aspect of the great toe and suspect there is some cellulitis changes.  He has had some improvement with Keflex.  -Avoid icing this area because of concern for cellulitis changes -Extend Keflex 500 mg 3 times daily for another 5 days -Follow-up promptly for any fever, chills, progressive  swelling or any progression and erythema  Meds ordered this encounter  Medications   cephALEXin (KEFLEX) 500 MG capsule    Sig: Take 1 capsule (500 mg total) by mouth 3 (three) times daily.    Dispense:  15 capsule    Refill:  0    Follow-up: No follow-ups on file.    Carolann Littler, MD

## 2021-03-23 NOTE — Patient Instructions (Signed)
Avoid ice to the foot at this point  Take the Keflex for the next week  Follow up for any fever, swelling, warmth.

## 2021-03-28 ENCOUNTER — Other Ambulatory Visit: Payer: Self-pay | Admitting: Cardiovascular Disease

## 2021-04-12 DIAGNOSIS — Z23 Encounter for immunization: Secondary | ICD-10-CM | POA: Diagnosis not present

## 2021-04-21 ENCOUNTER — Other Ambulatory Visit: Payer: Self-pay | Admitting: Cardiovascular Disease

## 2021-04-23 DIAGNOSIS — H18413 Arcus senilis, bilateral: Secondary | ICD-10-CM | POA: Diagnosis not present

## 2021-04-23 DIAGNOSIS — H18513 Endothelial corneal dystrophy, bilateral: Secondary | ICD-10-CM | POA: Diagnosis not present

## 2021-04-23 DIAGNOSIS — Z961 Presence of intraocular lens: Secondary | ICD-10-CM | POA: Diagnosis not present

## 2021-04-23 DIAGNOSIS — H04123 Dry eye syndrome of bilateral lacrimal glands: Secondary | ICD-10-CM | POA: Diagnosis not present

## 2021-06-16 DIAGNOSIS — L821 Other seborrheic keratosis: Secondary | ICD-10-CM | POA: Diagnosis not present

## 2021-06-16 DIAGNOSIS — L578 Other skin changes due to chronic exposure to nonionizing radiation: Secondary | ICD-10-CM | POA: Diagnosis not present

## 2021-06-16 DIAGNOSIS — Z23 Encounter for immunization: Secondary | ICD-10-CM | POA: Diagnosis not present

## 2021-06-16 DIAGNOSIS — D225 Melanocytic nevi of trunk: Secondary | ICD-10-CM | POA: Diagnosis not present

## 2021-06-16 DIAGNOSIS — L570 Actinic keratosis: Secondary | ICD-10-CM | POA: Diagnosis not present

## 2021-06-16 DIAGNOSIS — Z85828 Personal history of other malignant neoplasm of skin: Secondary | ICD-10-CM | POA: Diagnosis not present

## 2021-07-06 ENCOUNTER — Encounter (HOSPITAL_BASED_OUTPATIENT_CLINIC_OR_DEPARTMENT_OTHER): Payer: Self-pay

## 2021-07-06 ENCOUNTER — Other Ambulatory Visit: Payer: Self-pay

## 2021-07-06 ENCOUNTER — Emergency Department (HOSPITAL_BASED_OUTPATIENT_CLINIC_OR_DEPARTMENT_OTHER)
Admission: EM | Admit: 2021-07-06 | Discharge: 2021-07-06 | Disposition: A | Discharge status: Home / Self Care | Payer: Medicare Other | Attending: Emergency Medicine | Admitting: Emergency Medicine

## 2021-07-06 DIAGNOSIS — R040 Epistaxis: Secondary | ICD-10-CM | POA: Insufficient documentation

## 2021-07-06 DIAGNOSIS — Z7982 Long term (current) use of aspirin: Secondary | ICD-10-CM | POA: Insufficient documentation

## 2021-07-06 MED ORDER — SILVER NITRATE-POT NITRATE 75-25 % EX MISC
1.0000 | Freq: Once | CUTANEOUS | Status: AC
  Administered 2021-07-06: 1 via TOPICAL
  Filled 2021-07-06: qty 10

## 2021-07-06 NOTE — ED Provider Notes (Signed)
[facility] EM[facility] DEPT  Provider Note  CSN: [id number] Arrival date & time: 07/06/21 1702  History Chief Complaint  Patient presents with   Abrasion    [name] [name] is a 86 y.o. male here for bleeding from his R external nostril, onset earlier today. He had a similar thing happen in 2020, seen in the ED and had the spot cauterized with silver nitrate and sealed with dermabond. He has not had any problems with it since. He has seen a dermatologist for other skin conditions but has never had any issues with this spot otherwise. He des not recall any injury today. He had some silver nitrate sticks given to him at his prior ED visit and used them to stop the bleeding at home but started back in the waiting room some.    Home Medications Prior to Admission medications   Medication Sig Start Date End Date Taking? Authorizing Provider  aspirin 81 MG EC tablet Take 81 mg by mouth daily. Swallow whole.    [provider]  benazepril-hydrochlorthiazide (LOTENSIN HCT) 20-25 MG tablet TAKE 1 TABLET BY MOUTH EVERY DAY IN THE MORNING 03/29/21   [name], MD  cephALEXin (KEFLEX) 500 MG capsule Take 1 capsule (500 mg total) by mouth 3 (three) times daily. 03/23/21   Burchette, Alinda Sierras, MD  cholecalciferol (VITAMIN D) 1000 UNITS tablet Take 1,000 Units by mouth daily.     [provider]  Multiple Vitamin (MULTIVITAMIN WITH MINERALS) TABS tablet Take 1 tablet by mouth daily with breakfast.    [provider]  nitroGLYCERIN (NITROSTAT) 0.4 MG SL tablet Place 0.4 mg under the tongue every 5 (five) minutes as needed for chest pain (3 doses MAX).    [provider]  Probiotic Product (PROBIOTIC PO) Take 1 capsule by mouth daily with breakfast.    [provider]  rosuvastatin (CRESTOR) 20 MG tablet TAKE 1 TABLET BY MOUTH EVERY OTHER DAY 04/21/21   [name], MD     Allergies    Patient has no known allergies.   Review of  Systems   Review of Systems Please see HPI for pertinent positives and negatives  Physical Exam BP (!) 150/76 (BP Location: Right Arm)    Pulse 63    Temp 97.9 F (36.6 C)    Resp 18    Ht 5\' 11"  (1.803 m)    Wt 65.4 kg    SpO2 98%    BMI 20.11 kg/m   Physical Exam Vitals and nursing note reviewed.  HENT:     Head: Normocephalic.     Nose:     Comments: No bleeding from inside nose. There is a small area of scab/cautery over the R external nostril, difficult to discern source of bleeding but after palpating the area it did start to ooze again.  Eyes:     Extraocular Movements: Extraocular movements intact.  Pulmonary:     Effort: Pulmonary effort is normal.  Musculoskeletal:        General: Normal range of motion.     Cervical back: Neck supple.  Skin:    Findings: No rash (on exposed skin).  Neurological:     Mental Status: He is alert and oriented to person, place, and time.  Psychiatric:        Mood and Affect: Mood normal.    ED Results / Procedures / Treatments   EKG None  Procedures Procedures  Medications Ordered in the ED Medications  silver  nitrate applicators applicator 1 Stick (1 Stick Topical Given 07/06/21 2029)    Initial Impression and Plan  Patient with small area of persistent skin bleeding from external nostril. Suspect there is a small superficial blood vessel there that is the culprit. He is oozing a small amount of blood after my exam. Placed a nasal clamp on the nose with some gauze. Will return to clean off the remaining cautery to further evaluate the source.   ED Course   Clinical Course as of 07/06/21 2115  Tue Jul 06, 2021  2052 Still a small amount of bleeding after pressure, applied some dermabond, will continue to monitor for rebleeding. Patient and wife both nervous about what to do if it returns. Reassured that his bleeding is not life threatening and given some advised on how to manage at home if it starts again. Recommend Dermatology  follow up to assess if there is an underlying blood vessel there that I cannot see today that might benefit from a laser treatment.  [CS]  2113 Bleeding has stopped. He is ready to go home.  [CS]    Clinical Course User Index [CS] Truddie Hidden, MD     MDM Rules/Calculators/A&P Medical Decision Making Problems Addressed: Bleeding from the nose: acute illness or injury  Risk Prescription drug management.    Final Clinical Impression(s) / ED Diagnoses Final diagnoses:  Bleeding from the nose    Rx / DC Orders ED Discharge Orders     None        Truddie Hidden, MD 07/06/21 2115

## 2021-07-06 NOTE — ED Triage Notes (Signed)
Patient here POV from Home with Skin Tear.  Patient has Area of Bleeding to Outer Right Nose approximately 1 hour PTA. Profusely bleeding at that time and Patient utilized Silver Nitrate at Home to stop bleeding.  Patient has a History of Bleeding Area to Outer Right Nose. History of Same 2-3 years PTA.   No Bleeding currently. No Blood Thinning Medications.  NAD Noted during Triage. A&Ox4. GCS 15. Ambulatory.

## 2021-07-14 ENCOUNTER — Telehealth: Payer: Self-pay | Admitting: Family Medicine

## 2021-07-14 DIAGNOSIS — Z87828 Personal history of other (healed) physical injury and trauma: Secondary | ICD-10-CM | POA: Diagnosis not present

## 2021-07-14 DIAGNOSIS — I781 Nevus, non-neoplastic: Secondary | ICD-10-CM | POA: Diagnosis not present

## 2021-07-14 DIAGNOSIS — Z23 Encounter for immunization: Secondary | ICD-10-CM | POA: Diagnosis not present

## 2021-07-14 NOTE — Telephone Encounter (Signed)
Left message for patient to call back and schedule Medicare Annual Wellness Visit (AWV) either virtually or in office. Left  my jabber number [phone] ° ° °AWV-I per PALMETTO 05/16/09 °please schedule at anytime with [facility] Nurse Health Advisor 1 or 2 ° ° °This should be a 45 minute visit.  °

## 2021-07-27 ENCOUNTER — Other Ambulatory Visit: Payer: Self-pay

## 2021-07-27 DIAGNOSIS — I350 Nonrheumatic aortic (valve) stenosis: Secondary | ICD-10-CM

## 2021-07-27 NOTE — Progress Notes (Signed)
Patient needed updated echo order. Order placed. ?

## 2021-09-15 ENCOUNTER — Telehealth: Payer: Self-pay | Admitting: Family Medicine

## 2021-09-15 NOTE — Telephone Encounter (Signed)
Left message for patient to call back and schedule Medicare Annual Wellness Visit (AWV) either virtually or in office. Left  my jabber number [phone] ° ° °AWV-I per PALMETTO 05/16/09 °please schedule at anytime with [facility] Nurse Health Advisor 1 or 2 ° ° °This should be a 45 minute visit.  °

## 2021-09-20 ENCOUNTER — Ambulatory Visit (INDEPENDENT_AMBULATORY_CARE_PROVIDER_SITE_OTHER): Payer: Medicare Other | Admitting: Family Medicine

## 2021-09-20 ENCOUNTER — Encounter: Payer: Self-pay | Admitting: Family Medicine

## 2021-09-20 VITALS — BP 132/62 | HR 63 | Temp 98.2°F | Wt 145.0 lb

## 2021-09-20 DIAGNOSIS — J3089 Other allergic rhinitis: Secondary | ICD-10-CM

## 2021-09-20 NOTE — Progress Notes (Signed)
? ?  Subjective:  ? ? Patient ID: [name] [name], male    DOB: 06-Aug-1935, 86 y.o.   MRN: [id number] ? ?HPI ?Here for 2 days of mild soreness in the lymph nodes on both sides of his anterior neck. No fever or sneezing or ST. No coughing. He has tried Dayquil with no relief. He did spend 3 days this past weekend working in his yard.  ? ? ?Review of Systems  ?Constitutional: Negative.   ?HENT:  Negative for congestion, ear pain, facial swelling, postnasal drip, rhinorrhea, sneezing and sore throat.   ?Eyes: Negative.   ?Respiratory: Negative.    ? ?   ?Objective:  ? Physical Exam ?Constitutional:   ?   Appearance: Normal appearance. He is not ill-appearing.  ?HENT:  ?   Right Ear: Tympanic membrane, ear canal and external ear normal.  ?   Left Ear: Tympanic membrane, ear canal and external ear normal.  ?   Nose: Nose normal.  ?   Mouth/Throat:  ?   Pharynx: Oropharynx is clear.  ?Eyes:  ?   Conjunctiva/sclera: Conjunctivae normal.  ?Neck:  ?   Comments: There are no palpable nodes in the neck area, but he is slightly tender inferior to both mandibles  ?Pulmonary:  ?   Effort: Pulmonary effort is normal.  ?   Breath sounds: Normal breath sounds.  ?Neurological:  ?   Mental Status: He is alert.  ? ? ? ? ? ?   ?Assessment & Plan:  ?He is having symptoms from seasonal allergies. He will try Zyrtec and Flonase sprays daily until the pollen season is over.  ?Alysia Penna, MD ? ? ?

## 2021-10-12 NOTE — Progress Notes (Signed)
CARDIOLOGY OFFICE NOTE  Date:  10/18/2021    [name] [name] Date of Birth: May 18, 1935 Medical Record #[id number]  PCP:  Laurey Morale, MD  Cardiologist:  Andrez Grime chief complaint on file.   History of Present Illness:  86 y.o. history of CAD with DES to RCA 2009. CRF;s HTN, HLD SSS with bradycardia beta blockers avoided     Myovue 09/30/16 old inferior MI no ischemia EF 56% low risk Echo 10/02/19 mean gradient 17 peak 33 mmHg DVI 0.36  Carotid: 09/22/16 Plaque no stenosis f/u May 2020  Daughter works at [employer] and [employer]  2nd in charge   Had uneventful right TKR with Dr Maureen Ralphs on October 30 2017  Peritonsillar abscess August 2021   Echo 10/05/20  fairly stable peak velocity 42msec gradients 20/36 mmHg DVI 0.32 and AVA 1.1 cm2  Echo 10/15/21 :  mild/mod MR moderate AS mean gradients 26/47.6 mmHg DVI 0.27 AVA 0.85 cm2  No angina active with grandson  Saw primary Dr FSarajane Jews5/8/23 with some swollen lymph nodes in neck ? Seasonal allergies Rx with Flonase and Zyrtec  Given some progression discussed likely need for TAVR in next 2 years  He has no exertional symptoms  Has 11/12 yo grand kids in town   Past Medical History:  Diagnosis Date   Abscess of groin, left    Arthritis    R hip & knees   [place]    basal cell, history of    Closed fracture of right foot    a. 08/2014 Fx of R fifth metatarsal (Jones Fx).   Coronary artery disease    a. 11/2007 Cath/PCI: LM nl, LAD 40-532md1 40-50ost, LCX nl, RCA 90d (Platinum Study DES - promus), EF 60%.   Gastroesophageal reflux disease with hiatal hernia    H/O hiatal hernia    Hx MRSA infection    Hypertension    Pre-syncope    a. 08/2014 w/ sinus bradycardia in 40's in ED.    Past Surgical History:  Procedure Laterality Date   APPENDECTOMY     CARDIAC CATHETERIZATION  12/06/07   60%   COLONOSCOPY  06/08/2015   per Dr. MaWatt Climesno polyps    CORONARY ANGIOPLASTY WITH STENT PLACEMENT  11/2007   HERNIA  REPAIR     KNEE ARTHROSCOPY Right 10/03/2012   Procedure: RIGHT KNEEE ARTHROSCOPY WITH DEBRIDEMENT ;  Surgeon: FrGearlean AlfMD;  Location: [facility];  Service: Orthopedics;  Laterality: Right;   SHOULDER SURGERY  05/2016   rotator cuff and ligament repair    TOTAL HIP ARTHROPLASTY Right 04/05/2013   Procedure: RIGHT TOTAL HIP ARTHROPLASTY ANTERIOR APPROACH;  Surgeon: FrGearlean AlfMD;  [facility] Service: Orthopedics;  Laterality: Right;   TOTAL KNEE ARTHROPLASTY Right 10/30/2017   Procedure: RIGHT TOTAL KNEE ARTHROPLASTY;  Surgeon: AlGaynelle ArabianMD;  [facility];  Service: Orthopedics;  Laterality: Right;   YAG LASER APPLICATION     for glaucoma - both eyes, no drops as of yet      Medications: Current Outpatient Medications  Medication Sig Dispense Refill   aspirin 81 MG EC tablet Take 81 mg by mouth daily. Swallow whole.     benazepril-hydrochlorthiazide (LOTENSIN HCT) 20-25 MG tablet TAKE 1 TABLET BY MOUTH EVERY DAY IN THE MORNING 90 tablet 1   cholecalciferol (VITAMIN D) 1000 UNITS tablet Take 1,000 Units by mouth daily.      Multiple Vitamin (MULTIVITAMIN WITH MINERALS)  TABS tablet Take 1 tablet by mouth daily with breakfast.     nitroGLYCERIN (NITROSTAT) 0.4 MG SL tablet Place 0.4 mg under the tongue every 5 (five) minutes as needed for chest pain (3 doses MAX).     Probiotic Product (PROBIOTIC PO) Take 1 capsule by mouth daily with breakfast.     rosuvastatin (CRESTOR) 20 MG tablet TAKE 1 TABLET BY MOUTH EVERY OTHER DAY 45 tablet 1   No current facility-administered medications for this visit.    Allergies: No Known Allergies  Social History: The patient  reports that he quit smoking about 60 years ago. His smoking use included cigarettes. He has never used smokeless tobacco. He reports that he does not currently use alcohol. He reports that he does not use drugs.   Family History: The patient's family history includes Heart disease in an other family  member; Hypertension in his brother, sister, and another family member; Stomach cancer in his father.   Review of Systems: Please see the history of present illness.   Otherwise, the review of systems is positive for none.   All other systems are reviewed and negative.   Physical Exam: VS:  BP 110/62   Pulse 72   Ht '5\' 11"'$  (1.803 m)   Wt 142 lb (64.4 kg)   SpO2 98%   BMI 19.80 kg/m  .  BMI Body mass index is 19.8 kg/m.  Wt Readings from Last 3 Encounters:  10/18/21 142 lb (64.4 kg)  09/20/21 145 lb (65.8 kg)  07/06/21 144 lb 2.9 oz (65.4 kg)    Affect appropriate Healthy:  appears stated age HEENT: normal Neck supple with no adenopathy JVP normal no bruits no thyromegaly Lungs clear with no wheezing and good diaphragmatic motion Heart:  S1/S2 mild  AS  murmur, no rub, gallop or click PMI normal Abdomen: benighn, BS positve, no tenderness, no AAA no bruit.  No HSM or HJR Distal pulses intact with no bruits No edema Neuro non-focal Skin warm and dry Post right TKR     LABORATORY DATA:  EKG:   10/04/19 NSR normal ECG 10/18/2021 SR rate 52 normal  10/18/2021 SR rate 72 PR 254 msec otherwise normal   Lab Results  Component Value Date   WBC 6.4 01/09/2019   HGB 16.4 01/09/2019   HCT 47.5 01/09/2019   PLT 430.0 (H) 01/09/2019   GLUCOSE 99 01/09/2019   CHOL 149 01/09/2019   TRIG 110.0 01/09/2019   HDL 70.10 01/09/2019   LDLCALC 57 01/09/2019   ALT 19 01/09/2019   AST 22 01/09/2019   NA 133 (L) 01/09/2019   K 4.7 01/09/2019   CL 97 01/09/2019   CREATININE 1.00 01/09/2019   BUN 23 01/09/2019   CO2 26 01/09/2019   TSH 1.83 01/09/2019   PSA 1.78 01/09/2019   INR 1.00 10/19/2017   HGBA1C  12/07/2007    5.5 (NOTE)   The ADA recommends the following therapeutic goal for glycemic   control related to Hgb A1C measurement:   Goal of Therapy:   < 7.0% Hgb A1C   Reference: American Diabetes Association: Clinical Practice   Recommendations 2008, Diabetes Care,  2008,  31:(Suppl 1).    BNP (last 3 results) No results for input(s): BNP in the last 8760 hours.  ProBNP (last 3 results) No results for input(s): PROBNP in the last 8760 hours.   Other Studies Reviewed Today:  Echo 10/03/18   EF 60-65% Mild AS  Mean gradient 14 mmhg peak 30 mmHg  DVI 0.48 AVA 1.52 cm2     LHC 11/2007 LM: Normal LAD: Proximal to mid 40-50%, ostial D1 40-50% LCx: Normal RCA: Distal 90% EF 60% PCI: DES to the RCA    Assessment/Plan:  CAD:  Stable no angina medical Rx . Non ischemic myovue May 1700 no complications with general anesthesia and knee surgery June 2019    Benign essential HTN:  Well controlled.  Continue current medications and low sodium Dash type diet.     Hyperlipidemia:  Continue statin labs with primary Historically LDL <60   Bradycardia:  History of AV block and wenckebach avoid beta blocker no high Grade AV block or indication for PPM  Murmur:  AS echo 10/18/2021 stable moderate AS f/u TTE in 5 months  will likely be considered for TAVR In 2 years   Bruit:  Right side ASA  [place] 2018 palque no stenosis likely transmitted AS murmur   Ortho:  Post right TKR 10/30/17 Dr Maureen Ralphs improved    Disposition:   f/u in 6 months  with echo    Jenkins Rouge

## 2021-10-15 ENCOUNTER — Ambulatory Visit (HOSPITAL_COMMUNITY): Payer: Medicare Other | Attending: Cardiology

## 2021-10-15 ENCOUNTER — Other Ambulatory Visit (HOSPITAL_COMMUNITY): Payer: Medicare Other

## 2021-10-15 DIAGNOSIS — I350 Nonrheumatic aortic (valve) stenosis: Secondary | ICD-10-CM | POA: Insufficient documentation

## 2021-10-15 LAB — ECHOCARDIOGRAM COMPLETE
AR max vel: 0.91 cm2
AV Area VTI: 0.85 cm2
AV Area mean vel: 0.83 cm2
AV Mean grad: 26 mmHg
AV Peak grad: 47.6 mmHg
Ao pk vel: 3.45 m/s
Area-P 1/2: 2.02 cm2
S' Lateral: 2.7 cm

## 2021-10-18 ENCOUNTER — Ambulatory Visit (INDEPENDENT_AMBULATORY_CARE_PROVIDER_SITE_OTHER): Payer: Medicare Other | Admitting: Cardiovascular Disease

## 2021-10-18 ENCOUNTER — Encounter: Payer: Self-pay | Admitting: Cardiovascular Disease

## 2021-10-18 VITALS — BP 110/62 | HR 72 | Ht 71.0 in | Wt 142.0 lb

## 2021-10-18 DIAGNOSIS — I2510 Atherosclerotic heart disease of native coronary artery without angina pectoris: Secondary | ICD-10-CM | POA: Diagnosis not present

## 2021-10-18 DIAGNOSIS — I350 Nonrheumatic aortic (valve) stenosis: Secondary | ICD-10-CM

## 2021-10-18 NOTE — Patient Instructions (Addendum)
Medication Instructions:  Your physician recommends that you continue on your current medications as directed. Please refer to the Current Medication list given to you today.  *If you need a refill on your cardiac medications before your next appointment, please call your pharmacy*  Lab Work: If you have labs (blood work) drawn today and your tests are completely normal, you will receive your results only by: [place] (if you have MyChart) OR A paper copy in the mail If you have any lab test that is abnormal or we need to change your treatment, we will call you to review the results.  Testing/Procedures: Your physician has requested that you have an echocardiogram in 6 months same day as office visit. Echocardiography is a painless test that uses sound waves to create images of your heart. It provides your doctor with information about the size and shape of your heart and how well your heart's chambers and valves are working. This procedure takes approximately one hour. There are no restrictions for this procedure.   Follow-Up: At [facility], you and your health needs are our priority.  As part of our continuing mission to provide you with exceptional heart care, we have created designated Provider Care Teams.  These Care Teams include your primary Cardiologist (physician) and Advanced Practice Providers (APPs -  Physician Assistants and Nurse Practitioners) who all work together to provide you with the care you need, when you need it.  We recommend signing up for the patient portal called "MyChart".  Sign up information is provided on this After Visit Summary.  MyChart is used to connect with patients for Virtual Visits (Telemedicine).  Patients are able to view lab/test results, encounter notes, upcoming appointments, etc.  Non-urgent messages can be sent to your provider as well.   To learn more about what you can do with MyChart, go to [email or web address].    Your next  appointment:   6 month(s)  The format for your next appointment:   In Person  Provider:   Jenkins Rouge, MD {   Important Information About Sugar

## 2021-10-19 ENCOUNTER — Telehealth: Payer: Self-pay | Admitting: Family Medicine

## 2021-10-19 NOTE — Telephone Encounter (Signed)
Spoke to spouse to schedule Medicare Annual Wellness Visit (AWV) either virtually or in office.   She wanted a call back in fall  sept to schedule  AWV-I per [place] 05/16/09 please schedule at anytime with [facility] Nurse Health Advisor 1 or 2   This should be a 45 minute visit.

## 2021-10-25 ENCOUNTER — Other Ambulatory Visit: Payer: Self-pay

## 2021-10-25 MED ORDER — BENAZEPRIL-HYDROCHLOROTHIAZIDE 20-25 MG PO TABS: 1.0000 | ORAL_TABLET | Freq: Every day | ORAL | 3 refills | Status: DC

## 2021-10-25 NOTE — Telephone Encounter (Signed)
Pt's medication was sent to pt's pharmacy as requested. Confirmation received.  °

## 2021-10-27 ENCOUNTER — Other Ambulatory Visit: Payer: Self-pay | Admitting: Cardiovascular Disease

## 2022-01-05 DIAGNOSIS — L821 Other seborrheic keratosis: Secondary | ICD-10-CM | POA: Diagnosis not present

## 2022-01-05 DIAGNOSIS — Z85828 Personal history of other malignant neoplasm of skin: Secondary | ICD-10-CM | POA: Diagnosis not present

## 2022-01-05 DIAGNOSIS — D225 Melanocytic nevi of trunk: Secondary | ICD-10-CM | POA: Diagnosis not present

## 2022-01-05 DIAGNOSIS — L578 Other skin changes due to chronic exposure to nonionizing radiation: Secondary | ICD-10-CM | POA: Diagnosis not present

## 2022-01-05 DIAGNOSIS — L570 Actinic keratosis: Secondary | ICD-10-CM | POA: Diagnosis not present

## 2022-01-18 ENCOUNTER — Telehealth: Payer: Self-pay | Admitting: Family Medicine

## 2022-01-18 NOTE — Telephone Encounter (Signed)
Left message for patient to call back and schedule Medicare Annual Wellness Visit (AWV) either virtually or in office. Left  my jabber number [phone] ° ° °AWV-I per PALMETTO 05/16/09 °please schedule at anytime with [facility] Nurse Health Advisor 1 or 2 ° ° °This should be a 45 minute visit.  °

## 2022-02-09 ENCOUNTER — Telehealth: Payer: Self-pay | Admitting: Family Medicine

## 2022-02-09 NOTE — Telephone Encounter (Signed)
Left message for patient to call back and schedule Medicare Annual Wellness Visit (AWV) either virtually or in office. Left  my jabber number [phone]  AWV-I per [place] 05/16/09  please schedule with Nurse Health Adviser   45 min for awv-i and in office appointments 30 min for awv-s  phone/virtual appointments  

## 2022-02-18 ENCOUNTER — Ambulatory Visit (INDEPENDENT_AMBULATORY_CARE_PROVIDER_SITE_OTHER): Payer: Medicare Other

## 2022-02-18 DIAGNOSIS — Z23 Encounter for immunization: Secondary | ICD-10-CM | POA: Diagnosis not present

## 2022-02-28 ENCOUNTER — Ambulatory Visit (HOSPITAL_COMMUNITY)
Admission: EM | Admit: 2022-02-28 | Discharge: 2022-02-28 | Disposition: A | Discharge status: Home / Self Care | Payer: Medicare Other

## 2022-02-28 ENCOUNTER — Encounter (HOSPITAL_COMMUNITY): Payer: Self-pay | Admitting: Emergency Medicine

## 2022-02-28 DIAGNOSIS — J302 Other seasonal allergic rhinitis: Secondary | ICD-10-CM | POA: Diagnosis not present

## 2022-02-28 DIAGNOSIS — Z87898 Personal history of other specified conditions: Secondary | ICD-10-CM

## 2022-02-28 NOTE — Discharge Instructions (Signed)
I recommend taking a daily zyrtec to reduce congestion and sneezing.  Please follow up with the Ear Nose and Throat specialists as needed.  If you develop severe nose bleed that does not stop with direct pressure, please go to the Emergency Department.

## 2022-02-28 NOTE — ED Triage Notes (Signed)
Pt reports having nose bleeds for 3 days. Using nasal mist and chemical swab to help stp bleeding. Reports this happened about 3 years ago and was seen at ENT and had to be cauterized. Went to ENT today but told could schedule an appt for November. Reports does well until sneezes.

## 2022-02-28 NOTE — ED Provider Notes (Signed)
[facility]    CSN: [id number] Arrival date & time: 02/28/22  0809      History   Chief Complaint Chief Complaint  Patient presents with   Epistaxis    HPI [name] [name] is a 86 y.o. male.  Presents with concern for epistaxis Reports over the last 3 days if he blows his nose or sneezes, he will develop some bleeding. Only from the right nare. Last episode was last night, non severe History of epistaxis about 3 years ago that he had to have cauterized by ENT  He is not currently bleeding. He is worried that this keeps occurring. Has been using nasal saline mist and silver nitrate at home.  Denies severe headache, vision changes, shortness of breath  Past Medical History:  Diagnosis Date   Abscess of groin, left    Arthritis    R hip & knees   Cancer ([place])    basal cell, history of    Closed fracture of right foot    a. 08/2014 Fx of R fifth metatarsal (Jones Fx).   Coronary artery disease    a. 11/2007 Cath/PCI: LM nl, LAD 40-62m d1 40-50ost, LCX nl, RCA 90d (Platinum Study DES - promus), EF 60%.   Gastroesophageal reflux disease with hiatal hernia    H/O hiatal hernia    Hx MRSA infection    Hypertension    Pre-syncope    a. 08/2014 w/ sinus bradycardia in 40's in ED.    Patient Active Problem List   Diagnosis Date Noted   OA (osteoarthritis) of knee 10/30/2017   Aortic valve stenosis 07/18/2017   Chronic pain of right knee 07/18/2017   Osteoarthritis of knee 05/31/2017   Abscess of left groin 05/07/2016   Pre-syncope    Coronary artery disease    OA (osteoarthritis) of hip 04/05/2013   Lateral meniscal tear 10/03/2012   CONTACT DERMATITIS 12/12/2008   APHTHOUS ULCERS 09/22/2008   BOILS, RECURRENT 02/18/2008   Coronary atherosclerosis 12/10/2007   Elevated lipids 02/28/2007   Essential hypertension 02/28/2007    Past Surgical History:  Procedure Laterality Date   APPENDECTOMY     CARDIAC CATHETERIZATION  12/06/07   60%   COLONOSCOPY   06/08/2015   per Dr. MWatt Climes no polyps    CORONARY ANGIOPLASTY WITH STENT PLACEMENT  11/2007   HERNIA REPAIR     KNEE ARTHROSCOPY Right 10/03/2012   Procedure: RIGHT KNEEE ARTHROSCOPY WITH DEBRIDEMENT ;  Surgeon: FGearlean Alf MD;  [facility];  Service: Orthopedics;  Laterality: Right;   SHOULDER SURGERY  05/2016   rotator cuff and ligament repair    TOTAL HIP ARTHROPLASTY Right 04/05/2013   Procedure: RIGHT TOTAL HIP ARTHROPLASTY ANTERIOR APPROACH;  Surgeon: FGearlean Alf MD;  [facility]  Service: Orthopedics;  Laterality: Right;   TOTAL KNEE ARTHROPLASTY Right 10/30/2017   Procedure: RIGHT TOTAL KNEE ARTHROPLASTY;  Surgeon: AGaynelle Arabian MD;  [facility];  Service: Orthopedics;  Laterality: Right;   YAG LASER APPLICATION     for glaucoma - both eyes, no drops as of yet        Home Medications    Prior to Admission medications   Medication Sig Start Date End Date Taking? Authorizing Provider  aspirin 81 MG EC tablet Take 81 mg by mouth daily. Swallow whole.    [provider]  benazepril-hydrochlorthiazide (LOTENSIN HCT) 20-25 MG tablet Take 1 tablet by mouth daily. 10/25/21   NJosue Hector MD  cholecalciferol (VITAMIN  D) 1000 UNITS tablet Take 1,000 Units by mouth daily.     [provider]  Multiple Vitamin (MULTIVITAMIN WITH MINERALS) TABS tablet Take 1 tablet by mouth daily with breakfast.    [provider]  nitroGLYCERIN (NITROSTAT) 0.4 MG SL tablet Place 0.4 mg under the tongue every 5 (five) minutes as needed for chest pain (3 doses MAX).    [provider]  Probiotic Product (PROBIOTIC PO) Take 1 capsule by mouth daily with breakfast.    [provider]  rosuvastatin (CRESTOR) 20 MG tablet TAKE 1 TABLET BY MOUTH EVERY OTHER DAY 10/27/21   [name], MD    Family History Family History  Problem Relation Age of Onset   Hypertension Sister    Hypertension Brother    Stomach cancer Father         family hx   Hypertension Other        family hx   Heart disease Other        family hx   Heart attack Neg Hx    Stroke Neg Hx     Social History Social History   Tobacco Use   Smoking status: Former    Types: Cigarettes    Quit date: 09/28/1961    Years since quitting: 60.4   Smokeless tobacco: Never  Vaping Use   Vaping Use: Never used  Substance Use Topics   Alcohol use: Not Currently    Comment: occ   Drug use: No     Allergies   Patient has no known allergies.   Review of Systems Review of Systems  HENT:  Positive for nosebleeds.   Per HPI   Physical Exam Triage Vital Signs ED Triage Vitals  Enc Vitals Group     BP 02/28/22 0835 (!) 147/78     Pulse Rate 02/28/22 0835 67     Resp 02/28/22 0835 18     Temp 02/28/22 0835 98 F (36.7 C)     Temp Source 02/28/22 0835 Oral     SpO2 02/28/22 0835 96 %     Weight --      Height --      Head Circumference --      Peak Flow --      Pain Score 02/28/22 0834 0     Pain Loc --      Pain Edu? --      Excl. in [place]? --    No data found.  Updated Vital Signs BP (!) 147/78 (BP Location: Left Arm)   Pulse 67   Temp 98 F (36.7 C) (Oral)   Resp 18   SpO2 96%    Physical Exam Vitals and nursing note reviewed.  Constitutional:      General: He is not in acute distress.    Appearance: Normal appearance. He is not ill-appearing.  HENT:     Nose: No nasal tenderness or mucosal edema.     Right Nostril: No foreign body, epistaxis, septal hematoma or occlusion.     Left Nostril: No foreign body, epistaxis, septal hematoma or occlusion.     Right Turbinates: Swollen.     Left Turbinates: Swollen.     Mouth/Throat:     Mouth: Mucous membranes are moist.     Pharynx: Oropharynx is clear.  Cardiovascular:     Rate and Rhythm: Normal rate and regular rhythm.     Pulses: Normal pulses.  Pulmonary:     Effort: Pulmonary effort is normal.  Skin:  General: Skin is warm and dry.     Coloration: Skin is not  pale.  Neurological:     Mental Status: He is alert and oriented to person, place, and time.     UC Treatments / Results  Labs (all labs ordered are listed, but only abnormal results are displayed) Labs Reviewed - No data to display  EKG  Radiology No results found.  Procedures Procedures   Medications Ordered in UC Medications - No data to display  Initial Impression / Assessment and Plan / UC Course  I have reviewed the triage vital signs and the nursing notes.  Pertinent labs & imaging results that were available during my care of the patient were reviewed by me and considered in my medical decision making (see chart for details).  Discussed with patient as he is not actively bleeding there is not much we can do at this time.  I did recommend a daily allergy medicine such as Zyrtec 10 mg to reduce frequency of congestion, nose blowing, sneezing.  Discussed using nasal mist to keep the sinuses moist.  Recommend follow-up with ENT for further evaluation if recurrence.  Discussed ED precautions to which patient verbalizes understanding   Final Clinical Impressions(s) / UC Diagnoses   Final diagnoses:  History of epistaxis  Seasonal allergies     Discharge Instructions      I recommend taking a daily zyrtec to reduce congestion and sneezing.  Please follow up with the Ear Nose and Throat specialists as needed.  If you develop severe nose bleed that does not stop with direct pressure, please go to the Emergency Department.    ED Prescriptions   None    PDMP not reviewed this encounter.   Les Pou, [place] 02/28/22 [id number]

## 2022-03-03 ENCOUNTER — Encounter: Payer: Self-pay | Admitting: Family Medicine

## 2022-03-03 ENCOUNTER — Ambulatory Visit (INDEPENDENT_AMBULATORY_CARE_PROVIDER_SITE_OTHER): Payer: Medicare Other | Admitting: Family Medicine

## 2022-03-03 VITALS — BP 108/66 | HR 79 | Temp 97.8°F | Wt 146.0 lb

## 2022-03-03 DIAGNOSIS — R590 Localized enlarged lymph nodes: Secondary | ICD-10-CM | POA: Diagnosis not present

## 2022-03-03 DIAGNOSIS — Z87898 Personal history of other specified conditions: Secondary | ICD-10-CM | POA: Diagnosis not present

## 2022-03-03 DIAGNOSIS — I2510 Atherosclerotic heart disease of native coronary artery without angina pectoris: Secondary | ICD-10-CM

## 2022-03-03 DIAGNOSIS — K029 Dental caries, unspecified: Secondary | ICD-10-CM | POA: Diagnosis not present

## 2022-03-03 NOTE — Progress Notes (Signed)
Subjective:    Patient ID: [name] [name], male    DOB: 04-27-1936, 86 y.o.   MRN: [id number]  Chief Complaint  Patient presents with   gland     Lymph glands in neck under chin feel funny, after taking medications for nose, noticed it 5 days ago. 2 days ago while eating a tooth started hurting, saw dentist this am. Dentist stated nose work triggered the inflammation in glands.     HPI Patient is an 86 year old male with PMH SIG for HTN, CAD, AV stenosis, OA followed by Dr. Sarajane Jews and seen for acute concern.  Patient endorses recent epistaxis, dental pain x 2 days and lymphadenopathy.  Patient endorses history of epistaxis previously seen by ENT for cauterization 3 yrs ago.  Patient seen in [place] on 02/28/2022 with concern of epistaxis.  At time of visit no active bleeding noted.  Patient advised to continue saline nasal mist and follow-up with ENT for continued symptoms.  Pt developed pain in L uppper molar 2 days ago.  Seen by Dentist this am.  Lymphadenopathy thought 2/2 pt grinding teeth due to stress reaction and ongoing inflammation from nasal issues.  States had areas of the to filed down to make smooth to decrease irritation.  Past Medical History:  Diagnosis Date   Abscess of groin, left    Arthritis    R hip & knees   Cancer ([place])    basal cell, history of    Closed fracture of right foot    a. 08/2014 Fx of R fifth metatarsal (Jones Fx).   Coronary artery disease    a. 11/2007 Cath/PCI: LM nl, LAD 40-60m d1 40-50ost, LCX nl, RCA 90d (Platinum Study DES - promus), EF 60%.   Gastroesophageal reflux disease with hiatal hernia    H/O hiatal hernia    Hx MRSA infection    Hypertension    Pre-syncope    a. 08/2014 w/ sinus bradycardia in 40's in ED.    No Known Allergies  ROS General: Denies fever, chills, night sweats, changes in weight, changes in appetite HEENT: Denies headaches, ear pain, changes in vision, rhinorrhea, sore throat  + history of epistaxis, dental pain CV: Denies  CP, palpitations, SOB, orthopnea Pulm: Denies SOB, cough, wheezing GI: Denies abdominal pain, nausea, vomiting, diarrhea, constipation GU: Denies dysuria, hematuria, frequency Msk: Denies muscle cramps, joint pains Neuro: Denies weakness, numbness, tingling Skin: Denies rashes, bruising Psych: Denies depression, anxiety, hallucinations     Objective:    Blood pressure 108/66, pulse 79, temperature 97.8 F (36.6 C), temperature source Oral, weight 146 lb (66.2 kg), SpO2 97 %.   Gen. Pleasant, well-nourished, in no distress, normal affect   HEENT: [place]/AT, face symmetric, conjunctiva clear, no scleral icterus, PERRLA, EOMI, nares patent without drainage or bleeding, left upper molar with dental carrie at gumline, pharynx without erythema or exudate.  TMs normal bilaterally.  No cervical lymphadenopathy.  Mild sub-lingual lymphadenopathy. Lungs: no accessory muscle use Cardiovascular: RRR, no peripheral edema. Neuro:  A&Ox3, CN II-XII intact, normal gait Skin:  Warm, no lesions/ rash   Wt Readings from Last 3 Encounters:  03/03/22 146 lb (66.2 kg)  10/18/21 142 lb (64.4 kg)  09/20/21 145 lb (65.8 kg)    Lab Results  Component Value Date   WBC 6.4 01/09/2019   HGB 16.4 01/09/2019   HCT 47.5 01/09/2019   PLT 430.0 (H) 01/09/2019   GLUCOSE 99 01/09/2019   CHOL 149 01/09/2019   TRIG 110.0 01/09/2019  HDL 70.10 01/09/2019   LDLCALC 57 01/09/2019   ALT 19 01/09/2019   AST 22 01/09/2019   NA 133 (L) 01/09/2019   K 4.7 01/09/2019   CL 97 01/09/2019   CREATININE 1.00 01/09/2019   BUN 23 01/09/2019   CO2 26 01/09/2019   TSH 1.83 01/09/2019   PSA 1.78 01/09/2019   INR 1.00 10/19/2017   HGBA1C  12/07/2007    5.5 (NOTE)   The ADA recommends the following therapeutic goal for glycemic   control related to Hgb A1C measurement:   Goal of Therapy:   < 7.0% Hgb A1C   Reference: American Diabetes Association: Clinical Practice   Recommendations 2008, Diabetes Care,  2008, 31:(Suppl  1).    Assessment/Plan:  History of epistaxis -No active bleeding -Discussed aspirin use likely prolonged bleeding -Okay to continue using OTC Ayr nasal gel and saline nasal rinse -Patient advised to avoid forceful blowing of nose as could dislodge clot and cause rebleeding -Follow-up with ENT for continued or worsening symptoms  Pain due to dental caries -Continue follow-up with dentist  Lymphadenopathy, sublingual -Likely reactive and self-limiting -Patient advised to follow-up for prolonged or worsening lymphadenopathy  F/u as needed  Grier Mitts, MD

## 2022-03-03 NOTE — Patient Instructions (Signed)
You can continue to use Ayr nasal gel and saline nasal rinse as needed to help with provide moisture inside her nose.  Avoid forceful blowing of your nose as it may dislodge a clot causing you to have recurring bleeding.  Aspirin use can increase your bleeding.

## 2022-03-23 ENCOUNTER — Telehealth: Payer: Self-pay | Admitting: Family Medicine

## 2022-03-23 NOTE — Telephone Encounter (Signed)
Left message for patient to call back and schedule Medicare Annual Wellness Visit (AWV) either virtually or in office. Left  my jabber number [phone]  AWV-I per [place] 05/16/09  please schedule with Nurse Health Adviser   45 min for awv-i and in office appointments 30 min for awv-s  phone/virtual appointments  

## 2022-03-23 NOTE — Telephone Encounter (Signed)
Patient returned my call.  He wanted to wait until after first of year  he has a lot going on right now.  He wanted call Jan 2024

## 2022-03-29 DIAGNOSIS — M79652 Pain in left thigh: Secondary | ICD-10-CM | POA: Diagnosis not present

## 2022-04-01 ENCOUNTER — Ambulatory Visit (INDEPENDENT_AMBULATORY_CARE_PROVIDER_SITE_OTHER): Payer: Medicare Other | Admitting: Family Medicine

## 2022-04-01 ENCOUNTER — Encounter: Payer: Self-pay | Admitting: Family Medicine

## 2022-04-01 VITALS — BP 118/80 | HR 60 | Temp 97.6°F | Wt 142.1 lb

## 2022-04-01 DIAGNOSIS — I2510 Atherosclerotic heart disease of native coronary artery without angina pectoris: Secondary | ICD-10-CM

## 2022-04-01 DIAGNOSIS — R590 Localized enlarged lymph nodes: Secondary | ICD-10-CM

## 2022-04-01 DIAGNOSIS — R209 Unspecified disturbances of skin sensation: Secondary | ICD-10-CM

## 2022-04-01 NOTE — Progress Notes (Signed)
   Subjective:    Patient ID: [name] [name], male    DOB: [birth year], 86 y.o.   MRN: [id number]  HPI Here for several issues. First on 03-15-22 he had a dental surgery during which several teeth were "shaved down". He was started on Amoxicillin. The next day several lymph nodes swelled up in his neck, and they are still present. They are not painful, but he is alarmed by them. No ST or fever. Also on 03-28-22 he fell in his home when his stockinged foot slipped off a step and he landed on his left hip. He had some mild pain for a few days, but this resolved quickly. However since that day he has intermittent sensations down the lateral and anterior left thigh that he describes as "fluid running down my leg". This happens 3-4 times a day and it lasts about 30 seconds each time. He saw Dr. Wynelle Link on 03-30-22 and he had negative Xrays of the hip.    Review of Systems  Constitutional: Negative.   HENT: Negative.    Eyes: Negative.   Respiratory: Negative.    Cardiovascular: Negative.   Genitourinary: Negative.   Musculoskeletal:  Negative for arthralgias, joint swelling and myalgias.       Objective:   Physical Exam Constitutional:      Appearance: Normal appearance. He is not ill-appearing.  HENT:     Right Ear: Tympanic membrane, ear canal and external ear normal.     Left Ear: Tympanic membrane, ear canal and external ear normal.     Nose: Nose normal.     Mouth/Throat:     Pharynx: Oropharynx is clear.  Eyes:     Conjunctiva/sclera: Conjunctivae normal.  Neck:     Comments: There is a single small non-tender AC node on each side of the neck  Cardiovascular:     Rate and Rhythm: Normal rate and regular rhythm.     Pulses: Normal pulses.     Heart sounds: Normal heart sounds.  Pulmonary:     Effort: Pulmonary effort is normal.     Breath sounds: Normal breath sounds.  Musculoskeletal:     Cervical back: Neck supple.     Comments: The left lateral hip appears normal. The  left greater trochanter is not tender. The left hip is intact   Neurological:     Mental Status: He is alert.           Assessment & Plan:  He has several enlarged AC nodes that are reacting to the recent oral surgery. I reassured him this is normal, and they should resolve in another week or so. The odd sensations in the left thigh are the result of the recent trauma. This appears to have irritated a cutaneous nerve which is firing signals down the leg. This is benign and it should also resolve in a week or so. If not, he will return. Alysia Penna, MD

## 2022-04-04 DIAGNOSIS — R040 Epistaxis: Secondary | ICD-10-CM | POA: Diagnosis not present

## 2022-04-18 NOTE — Progress Notes (Signed)
CARDIOLOGY OFFICE NOTE  Date:  04/28/2022    [name] [name] [place] Date of Birth: 03-10-1936 Medical Record #[id number]  PCP:  Laurey Morale, MD  Cardiologist:  Andrez Grime chief complaint on file.   History of Present Illness:  86 y.o. history of CAD with DES to RCA 2009. CRF;s HTN, HLD SSS with bradycardia beta blockers avoided  Myovue 09/30/16 old inferior MI no ischemia EF 56% low risk  Daughter works at [employer]  2nd in charge  Two grand kids in town   Had uneventful right TKR with Dr Maureen Ralphs on October 30 2017  Peritonsillar abscess August 2021   Echo 10/05/20  fairly stable peak velocity 18msec gradients 20/36 mmHg DVI 0.32 and AVA 1.1 cm2  Echo 10/15/21 :  mild/mod MR moderate AS mean gradients 26/47.6 mmHg DVI 0.27 AVA 0.85 cm2  Saw primary Dr FSarajane Jews5/8/23 with some swollen lymph nodes in neck ? Seasonal allergies Rx with Flonase and Zyrtec  Given some progression discussed likely need for TAVR in next 2 years  He has no exertional symptoms  TTE 04/28/22 mean gradient 30 peak 51 mmHg AVA 0.9 cm2 He is still asymptomatic  Complains of some lymphadenopathy in neck and left hip pain after a fall   Sees dentist every 4 months and discussed importance of this if he is getting a valve in next year /2    Past Medical History:  Diagnosis Date   Abscess of groin, left    Arthritis    R hip & knees   Cancer ([facility]    basal cell, history of    Closed fracture of right foot    a. 08/2014 Fx of R fifth metatarsal (Jones Fx).   Coronary artery disease    a. 11/2007 Cath/PCI: LM nl, LAD 40-568md1 40-50ost, LCX nl, RCA 90d (Platinum Study DES - promus), EF 60%.   Gastroesophageal reflux disease with hiatal hernia    H/O hiatal hernia    Hx MRSA infection    Hypertension    Pre-syncope    a. 08/2014 w/ sinus bradycardia in 40's in ED.    Past Surgical History:  Procedure Laterality Date   APPENDECTOMY     CARDIAC CATHETERIZATION  12/06/07   60%    COLONOSCOPY  06/08/2015   per Dr. MaWatt Climesno polyps    CORONARY ANGIOPLASTY WITH STENT PLACEMENT  11/2007   HERNIA REPAIR     KNEE ARTHROSCOPY Right 10/03/2012   Procedure: RIGHT KNEEE ARTHROSCOPY WITH DEBRIDEMENT ;  Surgeon: FrGearlean AlfMD;  Location: [facility];  Service: Orthopedics;  Laterality: Right;   SHOULDER SURGERY  05/2016   rotator cuff and ligament repair    TOTAL HIP ARTHROPLASTY Right 04/05/2013   Procedure: RIGHT TOTAL HIP ARTHROPLASTY ANTERIOR APPROACH;  Surgeon: FrGearlean AlfMD;  [facility] Service: Orthopedics;  Laterality: Right;   TOTAL KNEE ARTHROPLASTY Right 10/30/2017   Procedure: RIGHT TOTAL KNEE ARTHROPLASTY;  Surgeon: AlGaynelle ArabianMD;  Location: [facility];  Service: Orthopedics;  Laterality: Right;   YAG LASER APPLICATION     for glaucoma - both eyes, no drops as of yet      Medications: Current Outpatient Medications  Medication Sig Dispense Refill   aspirin 81 MG EC tablet Take 81 mg by mouth daily. Swallow whole.     benazepril-hydrochlorthiazide (LOTENSIN HCT) 20-25 MG tablet Take 1 tablet by mouth daily. 90 tablet 3   cholecalciferol (VITAMIN D)  1000 UNITS tablet Take 1,000 Units by mouth daily.      Multiple Vitamin (MULTIVITAMIN WITH MINERALS) TABS tablet Take 1 tablet by mouth daily with breakfast.     nitroGLYCERIN (NITROSTAT) 0.4 MG SL tablet Place 0.4 mg under the tongue every 5 (five) minutes as needed for chest pain (3 doses MAX).     Probiotic Product (PROBIOTIC PO) Take 1 capsule by mouth daily with breakfast.     rosuvastatin (CRESTOR) 20 MG tablet TAKE 1 TABLET BY MOUTH EVERY OTHER DAY 45 tablet 3   No current facility-administered medications for this visit.    Allergies: No Known Allergies  Social History: The patient  reports that he quit smoking about 60 years ago. His smoking use included cigarettes. He has never used smokeless tobacco. He reports that he does not currently use alcohol. He reports that he does not use  drugs.   Family History: The patient's family history includes Heart disease in an other family member; Hypertension in his brother, sister, and another family member; Stomach cancer in his father.   Review of Systems: Please see the history of present illness.   Otherwise, the review of systems is positive for none.   All other systems are reviewed and negative.   Physical Exam: VS:  BP (!) 138/56   Pulse (!) 46   Ht '5\' 10"'$  (1.778 m)   Wt 142 lb 3.2 oz (64.5 kg)   SpO2 98%   BMI 20.40 kg/m  .  BMI Body mass index is 20.4 kg/m.  Wt Readings from Last 3 Encounters:  04/28/22 142 lb 3.2 oz (64.5 kg)  04/01/22 142 lb 2 oz (64.5 kg)  03/03/22 146 lb (66.2 kg)    Affect appropriate Healthy:  appears stated age 56: normal Neck supple with no adenopathy JVP normal no bruits no thyromegaly Lungs clear with no wheezing and good diaphragmatic motion Heart:  S1/S2 mild  AS  murmur, no rub, gallop or click PMI normal Abdomen: benighn, BS positve, no tenderness, no AAA no bruit.  No HSM or HJR Distal pulses intact with no bruits No edema Neuro non-focal Skin warm and dry Post right TKR     LABORATORY DATA:  EKG:   10/04/19 NSR normal ECG 04/28/2022 SR rate 52 normal  04/28/2022 SR rate 72 PR 254 msec otherwise normal   Lab Results  Component Value Date   WBC 6.4 01/09/2019   HGB 16.4 01/09/2019   HCT 47.5 01/09/2019   PLT 430.0 (H) 01/09/2019   GLUCOSE 99 01/09/2019   CHOL 149 01/09/2019   TRIG 110.0 01/09/2019   HDL 70.10 01/09/2019   LDLCALC 57 01/09/2019   ALT 19 01/09/2019   AST 22 01/09/2019   NA 133 (L) 01/09/2019   K 4.7 01/09/2019   CL 97 01/09/2019   CREATININE 1.00 01/09/2019   BUN 23 01/09/2019   CO2 26 01/09/2019   TSH 1.83 01/09/2019   PSA 1.78 01/09/2019   INR 1.00 10/19/2017   HGBA1C  12/07/2007    5.5 (NOTE)   The ADA recommends the following therapeutic goal for glycemic   control related to Hgb A1C measurement:   Goal of Therapy:   < 7.0%  Hgb A1C   Reference: American Diabetes Association: Clinical Practice   Recommendations 2008, Diabetes Care,  2008, 31:(Suppl 1).    BNP (last 3 results) No results for input(s): "BNP" in the last 8760 hours.  ProBNP (last 3 results) No results for input(s): "PROBNP" in the last  8760 hours.   Other Studies Reviewed Today:  Echo  10/15/21  IMPRESSIONS     1. Left ventricular ejection fraction, by estimation, is 60 to 65%. The  left ventricle has normal function. The left ventricle has no regional  wall motion abnormalities. There is mild left ventricular hypertrophy.  Left ventricular diastolic parameters  are consistent with Grade I diastolic dysfunction (impaired relaxation).   2. Right ventricular systolic function is normal. The right ventricular  size is normal. There is normal pulmonary artery systolic pressure. The  estimated right ventricular systolic pressure is 00.9 mmHg.   3. The mitral valve is normal in structure. Mild to moderate mitral valve  regurgitation. No evidence of mitral stenosis.   4. The aortic valve is calcified. There is severe calcifcation of the  aortic valve. There is moderate thickening of the aortic valve. Aortic  valve regurgitation is trivial. Moderate aortic valve stenosis. Aortic  valve mean gradient measures 26.0 mmHg.  Aortic valve Vmax measures 3.45 m/s.   5. The inferior vena cava is normal in size with greater than 50%  respiratory variability, suggesting right atrial pressure of 3 mmHg.   Comparison(s): Prior images reviewed side by side. Prior aortic mean  gradient 20 mmHg.      LHC 11/2007 LM: Normal LAD: Proximal to mid 40-50%, ostial D1 40-50% LCx: Normal RCA: Distal 90% EF 60% PCI: DES to the RCA    Assessment/Plan:  CAD:  Stable no angina medical Rx . Non ischemic myovue May 3818 no complications with general anesthesia and knee surgery June 2019    Benign essential HTN:  Well controlled.  Continue current medications  and low sodium Dash type diet.     Hyperlipidemia:  Continue statin labs with primary Historically LDL <60   Bradycardia:  History of AV block and wenckebach avoid beta blocker no high Grade AV block or indication for PPM  Murmur:  AS echo 10/15/21 stable moderate TTE 04/28/22   will likely be considered for TAVR In 2 years   Bruit:  Right side ASA  [place] 2018 palque no stenosis likely transmitted AS murmur   Ortho:  Post right TKR 10/30/17 Dr Maureen Ralphs improved    TTE 6 months  Carotid now bruit vs transmitted murmur   Disposition:   f/u in 6 months     Jenkins Rouge

## 2022-04-22 DIAGNOSIS — H26493 Other secondary cataract, bilateral: Secondary | ICD-10-CM | POA: Diagnosis not present

## 2022-04-22 DIAGNOSIS — H18513 Endothelial corneal dystrophy, bilateral: Secondary | ICD-10-CM | POA: Diagnosis not present

## 2022-04-22 DIAGNOSIS — H04123 Dry eye syndrome of bilateral lacrimal glands: Secondary | ICD-10-CM | POA: Diagnosis not present

## 2022-04-22 DIAGNOSIS — Z961 Presence of intraocular lens: Secondary | ICD-10-CM | POA: Diagnosis not present

## 2022-04-28 ENCOUNTER — Encounter: Payer: Self-pay | Admitting: Cardiovascular Disease

## 2022-04-28 ENCOUNTER — Ambulatory Visit (HOSPITAL_BASED_OUTPATIENT_CLINIC_OR_DEPARTMENT_OTHER): Payer: Medicare Other

## 2022-04-28 ENCOUNTER — Ambulatory Visit (HOSPITAL_COMMUNITY): Payer: Medicare Other | Attending: Cardiovascular Disease | Admitting: Cardiovascular Disease

## 2022-04-28 VITALS — BP 138/56 | HR 46 | Ht 70.0 in | Wt 142.2 lb

## 2022-04-28 DIAGNOSIS — I350 Nonrheumatic aortic (valve) stenosis: Secondary | ICD-10-CM

## 2022-04-28 DIAGNOSIS — I2510 Atherosclerotic heart disease of native coronary artery without angina pectoris: Secondary | ICD-10-CM | POA: Insufficient documentation

## 2022-04-28 DIAGNOSIS — I351 Nonrheumatic aortic (valve) insufficiency: Secondary | ICD-10-CM

## 2022-04-28 LAB — ECHOCARDIOGRAM COMPLETE
AR max vel: 0.92 cm2
AV Area VTI: 0.95 cm2
AV Area mean vel: 0.83 cm2
AV Mean grad: 24 mmHg
AV Peak grad: 45.3 mmHg
Ao pk vel: 3.36 m/s
Area-P 1/2: 2.72 cm2
S' Lateral: 2.8 cm

## 2022-04-28 MED ORDER — NITROGLYCERIN 0.4 MG SL SUBL: 0.4000 mg | SUBLINGUAL_TABLET | SUBLINGUAL | 3 refills | Status: DC | PRN

## 2022-04-28 NOTE — Patient Instructions (Addendum)
Medication Instructions:  Your physician recommends that you continue on your current medications as directed. Please refer to the Current Medication list given to you today.  *If you need a refill on your cardiac medications before your next appointment, please call your pharmacy*  Lab Work: If you have labs (blood work) drawn today and your tests are completely normal, you will receive your results only by: [place] (if you have MyChart) OR A paper copy in the mail If you have any lab test that is abnormal or we need to change your treatment, we will call you to review the results.  Testing/Procedures: Your physician has requested that you have an echocardiogram 6 month with office visit same day. Echocardiography is a painless test that uses sound waves to create images of your heart. It provides your doctor with information about the size and shape of your heart and how well your heart's chambers and valves are working. This procedure takes approximately one hour. There are no restrictions for this procedure. Please do NOT wear cologne, perfume, aftershave, or lotions (deodorant is allowed). Please arrive 15 minutes prior to your appointment time.  Your physician has requested that you have a carotid duplex. This test is an ultrasound of the carotid arteries in your neck. It looks at blood flow through these arteries that supply the brain with blood. Allow one hour for this exam. There are no restrictions or special instructions.   Follow-Up: At [facility], you and your health needs are our priority.  As part of our continuing mission to provide you with exceptional heart care, we have created designated Provider Care Teams.  These Care Teams include your primary Cardiologist (physician) and Advanced Practice Providers (APPs -  Physician Assistants and Nurse Practitioners) who all work together to provide you with the care you need, when you need it.  We recommend signing  up for the patient portal called "MyChart".  Sign up information is provided on this After Visit Summary.  MyChart is used to connect with patients for Virtual Visits (Telemedicine).  Patients are able to view lab/test results, encounter notes, upcoming appointments, etc.  Non-urgent messages can be sent to your provider as well.   To learn more about what you can do with MyChart, go to [email or web address].    Your next appointment:   6 month(s) with echo same day  The format for your next appointment:   In Person  Provider:   Jenkins Rouge, MD      Important Information About Sugar

## 2022-05-04 ENCOUNTER — Ambulatory Visit (HOSPITAL_COMMUNITY)
Admission: RE | Admit: 2022-05-04 | Discharge: 2022-05-04 | Disposition: A | Discharge status: Home / Self Care | Payer: Medicare Other | Source: Ambulatory Visit | Attending: Cardiovascular Disease | Admitting: Cardiovascular Disease

## 2022-05-04 DIAGNOSIS — I350 Nonrheumatic aortic (valve) stenosis: Secondary | ICD-10-CM | POA: Insufficient documentation

## 2022-05-23 ENCOUNTER — Telehealth: Payer: Self-pay | Admitting: Family Medicine

## 2022-05-23 NOTE — Telephone Encounter (Signed)
Left message for patient to call back and schedule Medicare Annual Wellness Visit (AWV) either virtually or in office. Left  my [name] [name] number [phone]   AWV-I per PAL[place] 05/16/09 please schedule with Nurse Health Adviser   45 min for awv-i in office appointments 30 min for awvi &  awv-s  phone/virtual appointments

## 2022-06-03 ENCOUNTER — Other Ambulatory Visit (HOSPITAL_BASED_OUTPATIENT_CLINIC_OR_DEPARTMENT_OTHER): Payer: Self-pay | Admitting: Cardiovascular Disease

## 2022-06-03 DIAGNOSIS — I6523 Occlusion and stenosis of bilateral carotid arteries: Secondary | ICD-10-CM

## 2022-06-17 ENCOUNTER — Ambulatory Visit: Payer: Medicare Other

## 2022-09-14 ENCOUNTER — Telehealth: Payer: Self-pay | Admitting: Family Medicine

## 2022-09-14 ENCOUNTER — Other Ambulatory Visit: Payer: Self-pay | Admitting: Cardiovascular Disease

## 2022-09-14 NOTE — Telephone Encounter (Signed)
Called patient to schedule Medicare Annual Wellness Visit (AWV). Left message for patient to call back and schedule Medicare Annual Wellness Visit (AWV).  Last date of AWV:  AWV-I per PA*LMETTO 05/16/09  Please schedule an appointment at any time with [facility] or hannah kim.  If any questions, please contact me at [phone].  Thank you ,  Ronan Dion [facility] AWV direct phone # [phone] 

## 2022-10-11 ENCOUNTER — Other Ambulatory Visit: Payer: Self-pay

## 2022-10-11 MED ORDER — BENAZEPRIL-HYDROCHLOROTHIAZIDE 20-25 MG PO TABS: 1.0000 | ORAL_TABLET | Freq: Every day | ORAL | 0 refills | Status: DC

## 2022-10-19 ENCOUNTER — Ambulatory Visit
Admission: EM | Admit: 2022-10-19 | Discharge: 2022-10-19 | Disposition: A | Discharge status: Home / Self Care | Payer: Medicare Other | Attending: Family Medicine | Admitting: Family Medicine

## 2022-10-19 ENCOUNTER — Ambulatory Visit (INDEPENDENT_AMBULATORY_CARE_PROVIDER_SITE_OTHER): Payer: Medicare Other

## 2022-10-19 ENCOUNTER — Emergency Department (HOSPITAL_COMMUNITY): Payer: Medicare Other

## 2022-10-19 ENCOUNTER — Other Ambulatory Visit: Payer: Self-pay

## 2022-10-19 ENCOUNTER — Encounter (HOSPITAL_COMMUNITY): Payer: Self-pay

## 2022-10-19 ENCOUNTER — Inpatient Hospital Stay (HOSPITAL_COMMUNITY)
Admission: EM | Admit: 2022-10-19 | Discharge: 2022-10-23 | DRG: 505 | Disposition: A | Discharge status: Home Health | Payer: Medicare Other | Attending: Internal Medicine | Admitting: Internal Medicine

## 2022-10-19 DIAGNOSIS — E876 Hypokalemia: Secondary | ICD-10-CM | POA: Diagnosis present

## 2022-10-19 DIAGNOSIS — L97508 Non-pressure chronic ulcer of other part of unspecified foot with other specified severity: Secondary | ICD-10-CM | POA: Diagnosis not present

## 2022-10-19 DIAGNOSIS — M869 Osteomyelitis, unspecified: Secondary | ICD-10-CM

## 2022-10-19 DIAGNOSIS — Z79899 Other long term (current) drug therapy: Secondary | ICD-10-CM

## 2022-10-19 DIAGNOSIS — L97519 Non-pressure chronic ulcer of other part of right foot with unspecified severity: Secondary | ICD-10-CM | POA: Diagnosis not present

## 2022-10-19 DIAGNOSIS — L03031 Cellulitis of right toe: Secondary | ICD-10-CM

## 2022-10-19 DIAGNOSIS — Z96651 Presence of right artificial knee joint: Secondary | ICD-10-CM | POA: Diagnosis present

## 2022-10-19 DIAGNOSIS — Z96641 Presence of right artificial hip joint: Secondary | ICD-10-CM | POA: Diagnosis present

## 2022-10-19 DIAGNOSIS — Z8614 Personal history of Methicillin resistant Staphylococcus aureus infection: Secondary | ICD-10-CM

## 2022-10-19 DIAGNOSIS — I252 Old myocardial infarction: Secondary | ICD-10-CM | POA: Diagnosis not present

## 2022-10-19 DIAGNOSIS — M170 Bilateral primary osteoarthritis of knee: Secondary | ICD-10-CM | POA: Diagnosis present

## 2022-10-19 DIAGNOSIS — Z7982 Long term (current) use of aspirin: Secondary | ICD-10-CM

## 2022-10-19 DIAGNOSIS — Z8249 Family history of ischemic heart disease and other diseases of the circulatory system: Secondary | ICD-10-CM | POA: Diagnosis not present

## 2022-10-19 DIAGNOSIS — M86171 Other acute osteomyelitis, right ankle and foot: Principal | ICD-10-CM | POA: Diagnosis present

## 2022-10-19 DIAGNOSIS — M7989 Other specified soft tissue disorders: Secondary | ICD-10-CM | POA: Diagnosis not present

## 2022-10-19 DIAGNOSIS — I2510 Atherosclerotic heart disease of native coronary artery without angina pectoris: Secondary | ICD-10-CM | POA: Diagnosis not present

## 2022-10-19 DIAGNOSIS — Z87891 Personal history of nicotine dependence: Secondary | ICD-10-CM

## 2022-10-19 DIAGNOSIS — M1611 Unilateral primary osteoarthritis, right hip: Secondary | ICD-10-CM | POA: Diagnosis present

## 2022-10-19 DIAGNOSIS — Z955 Presence of coronary angioplasty implant and graft: Secondary | ICD-10-CM

## 2022-10-19 DIAGNOSIS — I10 Essential (primary) hypertension: Secondary | ICD-10-CM | POA: Diagnosis not present

## 2022-10-19 DIAGNOSIS — L089 Local infection of the skin and subcutaneous tissue, unspecified: Secondary | ICD-10-CM

## 2022-10-19 DIAGNOSIS — Z85828 Personal history of other malignant neoplasm of skin: Secondary | ICD-10-CM

## 2022-10-19 DIAGNOSIS — K219 Gastro-esophageal reflux disease without esophagitis: Secondary | ICD-10-CM | POA: Diagnosis present

## 2022-10-19 LAB — CBC WITH DIFFERENTIAL/PLATELET
Abs Immature Granulocytes: 0.04 10*3/uL (ref 0.00–0.07)
Basophils Absolute: 0 10*3/uL (ref 0.0–0.1)
Basophils Relative: 1 %
Eosinophils Absolute: 0.1 10*3/uL (ref 0.0–0.5)
Eosinophils Relative: 2 %
HCT: 43.3 % (ref 39.0–52.0)
Hemoglobin: 14.6 g/dL (ref 13.0–17.0)
Immature Granulocytes: 1 %
Lymphocytes Relative: 18 %
Lymphs Abs: 1.4 10*3/uL (ref 0.7–4.0)
MCH: 32.6 pg (ref 26.0–34.0)
MCHC: 33.7 g/dL (ref 30.0–36.0)
MCV: 96.7 fL (ref 80.0–100.0)
Monocytes Absolute: 1 10*3/uL (ref 0.1–1.0)
Monocytes Relative: 12 %
Neutro Abs: 5.3 10*3/uL (ref 1.7–7.7)
Neutrophils Relative %: 66 %
Platelets: 491 10*3/uL — ABNORMAL HIGH (ref 150–400)
RBC: 4.48 MIL/uL (ref 4.22–5.81)
RDW: 13.2 % (ref 11.5–15.5)
WBC: 7.9 10*3/uL (ref 4.0–10.5)
nRBC: 0 % (ref 0.0–0.2)

## 2022-10-19 LAB — COMPREHENSIVE METABOLIC PANEL
ALT: 17 U/L (ref 0–44)
AST: 19 U/L (ref 15–41)
Albumin: 4.1 g/dL (ref 3.5–5.0)
Alkaline Phosphatase: 56 U/L (ref 38–126)
Anion gap: 11 (ref 5–15)
BUN: 22 mg/dL (ref 8–23)
CO2: 25 mmol/L (ref 22–32)
Calcium: 9.1 mg/dL (ref 8.9–10.3)
Chloride: 102 mmol/L (ref 98–111)
Creatinine, Ser: 1.07 mg/dL (ref 0.61–1.24)
GFR, Estimated: >60 mL/min (ref >60)
Glucose, Bld: 94 mg/dL (ref 70–99)
Potassium: 3.5 mmol/L (ref 3.5–5.1)
Sodium: 138 mmol/L (ref 135–145)
Total Bilirubin: 0.8 mg/dL (ref 0.3–1.2)
Total Protein: 7.5 g/dL (ref 6.5–8.1)

## 2022-10-19 MED ORDER — SULFAMETHOXAZOLE-TRIMETHOPRIM 800-160 MG PO TABS: 1.0000 | ORAL_TABLET | Freq: Two times a day (BID) | ORAL | 0 refills | Status: DC

## 2022-10-19 MED ORDER — VANCOMYCIN HCL IN DEXTROSE 1-5 GM/200ML-% IV SOLN
1000.0000 mg | Freq: Once | INTRAVENOUS | Status: AC
Start: 2022-10-19 — End: 2022-10-20
  Administered 2022-10-19: 1000 mg via INTRAVENOUS
  Filled 2022-10-19: qty 200

## 2022-10-19 MED ORDER — PIPERACILLIN-TAZOBACTAM 3.375 G IVPB 30 MIN
3.3750 g | Freq: Once | INTRAVENOUS | Status: AC
  Administered 2022-10-19: 3.375 g via INTRAVENOUS
  Filled 2022-10-19: qty 50

## 2022-10-19 NOTE — ED Notes (Signed)
Patient is being discharged from the Urgent Care and sent to the Emergency Department via POV . Per M. Ragan FNP, patient is in need of higher level of care due to toe infection. Patient is aware and verbalizes understanding of plan of care.  Vitals:   10/19/22 1755  BP: (!) 165/72  Pulse: 65  Resp: 16  Temp: 97.6 F (36.4 C)  SpO2: 98%

## 2022-10-19 NOTE — ED Triage Notes (Signed)
Pt has cellulitis if his right 2nd toe x 7 days. Pt went to UC and was told to come her for IV abts.

## 2022-10-19 NOTE — ED Triage Notes (Signed)
Right second toe dorsal ulceration present over the proximal interphalangeal joint with obvious soft tissue swelling and erythema developing over the last week. Patient states the site is not very painful. Denies fever, diabetic history

## 2022-10-19 NOTE — Discharge Instructions (Addendum)
Patient advised of right foot x-ray results with hardcopy provided with this AVS.  Advised patient to go to [facility] ED now for further evaluation of right second toe cellulitis and need for IV antibiotics.

## 2022-10-19 NOTE — ED Provider Notes (Signed)
[name]    CSN: [id number] Arrival date & time: 10/19/22  1742      History   Chief Complaint Chief Complaint  Patient presents with   Toe Injury    HPI [name] [name] is a 87 y.o. male.   HPI Very pleasant 87 year old male presents with right second toe redness x 7 days.  Patient is accompanied by his wife this evening.  PMH significant for BCC, MSRA, and HTN  Past Medical History:  Diagnosis Date   Abscess of groin, left    Arthritis    R hip & knees   Cancer (HCC)    basal cell, history of    Closed fracture of right foot    a. 08/2014 Fx of R fifth metatarsal (Jones Fx).   Coronary artery disease    a. 11/2007 Cath/PCI: LM nl, LAD 40-38m, d1 40-50ost, LCX nl, RCA 90d (Platinum Study DES - promus), EF 60%.   Gastroesophageal reflux disease with hiatal hernia    H/O hiatal hernia    Hx MRSA infection    Hypertension    Pre-syncope    a. 08/2014 w/ sinus bradycardia in 40's in ED.    Patient Active Problem List   Diagnosis Date Noted   OA (osteoarthritis) of knee 10/30/2017   Aortic valve stenosis 07/18/2017   Chronic pain of right knee 07/18/2017   Osteoarthritis of knee 05/31/2017   Abscess of left groin 05/07/2016   Pre-syncope    Coronary artery disease    OA (osteoarthritis) of hip 04/05/2013   Lateral meniscal tear 10/03/2012   CONTACT DERMATITIS 12/12/2008   APHTHOUS ULCERS 09/22/2008   BOILS, RECURRENT 02/18/2008   Coronary atherosclerosis 12/10/2007   Elevated lipids 02/28/2007   Essential hypertension 02/28/2007    Past Surgical History:  Procedure Laterality Date   APPENDECTOMY     CARDIAC CATHETERIZATION  12/06/07   60%   COLONOSCOPY  06/08/2015   per Dr. Ewing Schlein, no polyps    CORONARY ANGIOPLASTY WITH STENT PLACEMENT  11/2007   HERNIA REPAIR     KNEE ARTHROSCOPY Right 10/03/2012   Procedure: RIGHT KNEEE ARTHROSCOPY WITH DEBRIDEMENT ;  Surgeon: Loanne Drilling, MD;  Location: [facility];  Service: Orthopedics;  Laterality:  Right;   SHOULDER SURGERY  05/2016   rotator cuff and ligament repair    TOTAL HIP ARTHROPLASTY Right 04/05/2013   Procedure: RIGHT TOTAL HIP ARTHROPLASTY ANTERIOR APPROACH;  Surgeon: Loanne Drilling, MD;  Location: [facility];  Service: Orthopedics;  Laterality: Right;   TOTAL KNEE ARTHROPLASTY Right 10/30/2017   Procedure: RIGHT TOTAL KNEE ARTHROPLASTY;  Surgeon: Ollen Gross, MD;  Location: [facility];  Service: Orthopedics;  Laterality: Right;   YAG LASER APPLICATION     for glaucoma - both eyes, no drops as of yet        Home Medications    Prior to Admission medications   Medication Sig Start Date End Date Taking? Authorizing Provider  aspirin 81 MG EC tablet Take 81 mg by mouth daily. Swallow whole.    [provider]  benazepril-hydrochlorthiazide (LOTENSIN HCT) 20-25 MG tablet Take 1 tablet by mouth daily. 10/11/22   Wendall Stade, MD  cholecalciferol (VITAMIN D) 1000 UNITS tablet Take 1,000 Units by mouth daily.     [provider]  Multiple Vitamin (MULTIVITAMIN WITH MINERALS) TABS tablet Take 1 tablet by mouth daily with breakfast.    [provider]  nitroGLYCERIN (NITROSTAT) 0.4 MG SL tablet Place 1 tablet (0.4  mg total) under the tongue every 5 (five) minutes as needed for chest pain (3 doses MAX). 04/28/22   Wendall Stade, MD  Probiotic Product (PROBIOTIC PO) Take 1 capsule by mouth daily with breakfast.    [provider]  rosuvastatin (CRESTOR) 20 MG tablet TAKE 1 TABLET BY MOUTH EVERY OTHER DAY 10/27/21   Wendall Stade, MD    Family History Family History  Problem Relation Age of Onset   Hypertension Sister    Hypertension Brother    Stomach cancer Father        family hx   Hypertension Other        family hx   Heart disease Other        family hx   Heart attack Neg Hx    Stroke Neg Hx     Social History Social History   Tobacco Use   Smoking status: Former    Types: Cigarettes    Quit date: 09/28/1961    Years since  quitting: 61.0   Smokeless tobacco: Never  Vaping Use   Vaping Use: Never used  Substance Use Topics   Alcohol use: Not Currently    Comment: occ   Drug use: No     Allergies   Patient has no known allergies.   Review of Systems Review of Systems  Skin:  Positive for wound.     Physical Exam Triage Vital Signs ED Triage Vitals  Enc Vitals Group     BP 10/19/22 1755 (!) 165/72     Pulse Rate 10/19/22 1755 65     Resp 10/19/22 1755 16     Temp 10/19/22 1755 97.6 F (36.4 C)     Temp Source 10/19/22 1755 Oral     SpO2 10/19/22 1755 98 %     Weight --      Height --      Head Circumference --      Peak Flow --      Pain Score 10/19/22 1757 0     Pain Loc --      Pain Edu? --      Excl. in GC? --    No data found.  Updated Vital Signs BP (!) 165/72 (BP Location: Left Arm)   Pulse 65   Temp 97.6 F (36.4 C) (Oral)   Resp 16   SpO2 98%    Physical Exam Vitals and nursing note reviewed.  Constitutional:      Appearance: Normal appearance. He is normal weight.  HENT:     Head: Normocephalic and atraumatic.     Mouth/Throat:     Mouth: Mucous membranes are moist.     Pharynx: Oropharynx is clear.  Eyes:     Extraocular Movements: Extraocular movements intact.     Conjunctiva/sclera: Conjunctivae normal.     Pupils: Pupils are equal, round, and reactive to light.  Cardiovascular:     Rate and Rhythm: Normal rate and regular rhythm.     Pulses: Normal pulses.     Heart sounds: Normal heart sounds.  Pulmonary:     Effort: Pulmonary effort is normal.     Breath sounds: Normal breath sounds. No wheezing, rhonchi or rales.  Musculoskeletal:        General: Normal range of motion.     Cervical back: Normal range of motion and neck supple.     Comments: Right foot (dorsum over second toe): Significant soft tissue swelling, erythematous, trace purulent drainage noted-please see image below  Skin:    General: Skin is warm and dry.  Neurological:     General:  No focal deficit present.     Mental Status: He is alert and oriented to person, place, and time. Mental status is at baseline.  Psychiatric:        Mood and Affect: Mood normal.        Behavior: Behavior normal.        Thought Content: Thought content normal.       UC Treatments / Results  Labs (all labs ordered are listed, but only abnormal results are displayed) Labs Reviewed - No data to display  EKG   Radiology No results found.  Procedures Procedures (including critical care time)  Medications Ordered in UC Medications - No data to display  Initial Impression / Assessment and Plan / UC Course  I have reviewed the triage vital signs and the nursing notes.  Pertinent labs & imaging results that were available during my care of the patient were reviewed by me and considered in my medical decision making (see chart for details).     MDM: Cellulitis of second toe of right foot-right foot x-ray revealed above.  Patient advised to go to [facility] ED for further evaluation and probable IV antibiotics for cellulitis of second great toe. Final Clinical Impressions(s) / UC Diagnoses   Final diagnoses:  Cellulitis of second toe of right foot   Discharge Instructions   None    ED Prescriptions   None    PDMP not reviewed this encounter.   Trevor Iha, FNP 10/19/22 1911

## 2022-10-19 NOTE — ED Provider Notes (Signed)
[place] EMERGENCY DEPARTMENT AT [facility] Provider Note   CSN: [id number] Arrival date & time: 10/19/22  1937     History  Chief Complaint  Patient presents with   Cellulitis    [name] [name] is a 87 y.o. male.  HPI   87 year old male presents emergency department with right second toe infection.  Patient believes that he had some rubbing or irritation of that a couple days ago.  Noticed that it was red and worsening so went to urgent care today.  X-ray showed concern for lucency and he was referred here for further rule for osteomyelitis.  Patient denies any fever or systemic symptoms.  No history of diabetes or previous osteomyelitis.  Is otherwise been in his usual state of health.  Home Medications Prior to Admission medications   Medication Sig Start Date End Date Taking? Authorizing Provider  aspirin 81 MG EC tablet Take 81 mg by mouth daily. Swallow whole.    [provider]  benazepril-hydrochlorthiazide (LOTENSIN HCT) 20-25 MG tablet Take 1 tablet by mouth daily. 10/11/22   Wendall Stade, MD  cholecalciferol (VITAMIN D) 1000 UNITS tablet Take 1,000 Units by mouth daily.     [provider]  Multiple Vitamin (MULTIVITAMIN WITH MINERALS) TABS tablet Take 1 tablet by mouth daily with breakfast.    [provider]  nitroGLYCERIN (NITROSTAT) 0.4 MG SL tablet Place 1 tablet (0.4 mg total) under the tongue every 5 (five) minutes as needed for chest pain (3 doses MAX). 04/28/22   Wendall Stade, MD  Probiotic Product (PROBIOTIC PO) Take 1 capsule by mouth daily with breakfast.    [provider]  rosuvastatin (CRESTOR) 20 MG tablet TAKE 1 TABLET BY MOUTH EVERY OTHER DAY 10/27/21   Wendall Stade, MD      Allergies    Patient has no known allergies.    Review of Systems   Review of Systems  Constitutional:  Negative for chills and fever.  Respiratory:  Negative for shortness of breath.   Cardiovascular:  Negative for  chest pain.  Gastrointestinal:  Negative for nausea and vomiting.  Musculoskeletal:        Right second toe infection  Skin:  Negative for rash.  Neurological:  Negative for headaches.    Physical Exam Updated Vital Signs BP (!) 156/75   Pulse (!) 58   Temp 97.9 F (36.6 C) (Oral)   Resp 16   Ht 5\' 11"  (1.803 m)   Wt 68 kg   SpO2 100%   BMI 20.92 kg/m  Physical Exam Vitals and nursing note reviewed.  Constitutional:      Appearance: Normal appearance.  HENT:     Head: Normocephalic.     Mouth/Throat:     Mouth: Mucous membranes are moist.  Cardiovascular:     Rate and Rhythm: Normal rate.  Pulmonary:     Effort: Pulmonary effort is normal. No respiratory distress.  Musculoskeletal:     Comments: Right second toe is circumferentially erythematous, warm with an open ulcer on the dorsal aspect with some mild drainage, please refer to picture, equal DP pulses  Skin:    General: Skin is warm.  Neurological:     Mental Status: He is alert and oriented to person, place, and time. Mental status is at baseline.  Psychiatric:        Mood and Affect: Mood normal.       ED Results / Procedures / Treatments   Labs (all  labs ordered are listed, but only abnormal results are displayed) Labs Reviewed  CBC WITH DIFFERENTIAL/PLATELET - Abnormal; Notable for the following components:      Result Value   Platelets 491 (*)    All other components within normal limits  COMPREHENSIVE METABOLIC PANEL    EKG None  Radiology DG Foot Complete Right  Result Date: 10/19/2022 CLINICAL DATA:  Second toe cellulitis for 7 days. EXAM: RIGHT FOOT COMPLETE - 3 VIEW COMPARISON:  Foot x-ray 08/31/2014 FINDINGS: Diffuse vascular calcifications. Preserved bone mineralization. Diffuse soft tissue swelling about the foot, greatest involving the second digit. Scattered mild degenerative changes are identified. There is hallux valgus deformity of the first ray with some hypertrophic changes. Stable  subchondral cysts along the first metatarsal head. There is deformity involving the distal aspect of the proximal phalanx of second digit with some lucency. There is also some deformity involving the second metatarsophalangeal joint. With the swelling and history, an area of infection is possible. IMPRESSION: Progressive hallux valgus deformity of the first ray with scattered degenerative changes. Diffuse soft tissue swelling particularly of second digit. There is also some lucency suggested involving the proximal phalanx of the second digit. With a history and area of osteomyelitis is possible. Confirmatory MRI or bone scan could be considered as clinically appropriate for further delineation. Electronically Signed   By: Karen Kays M.D.   On: 10/19/2022 18:45    Procedures Procedures    Medications Ordered in ED Medications  vancomycin (VANCOCIN) IVPB 1000 mg/200 mL premix (1,000 mg Intravenous New Bag/Given 10/19/22 2301)  piperacillin-tazobactam (ZOSYN) IVPB 3.375 g (0 g Intravenous Stopped 10/19/22 2302)    ED Course/ Medical Decision Making/ A&P                             Medical Decision Making Amount and/or Complexity of Data Reviewed Labs: ordered. Radiology: ordered.  Risk Prescription drug management.   87 year old male presents emergency department concern for osteomyelitis of the right second toe.  He is afebrile, otherwise feels in his baseline health.  The toe is erythematous, open ulcer with some mild drainage.  Previous x-ray at urgent care showed concern for possible lucency.  Referred here for further rule out.  Blood work is reassuring with no leukocytosis.  Will treat with IV antibiotics and obtain MRI to further evaluate for osteomyelitis.  If this is negative anticipate discharge with oral antibiotics and outpatient follow-up.  Patient signed out pending MRI.        Final Clinical Impression(s) / ED Diagnoses Final diagnoses:  None    Rx / DC  Orders ED Discharge Orders     None         Rozelle Logan, DO 10/19/22 2329

## 2022-10-20 ENCOUNTER — Inpatient Hospital Stay (HOSPITAL_COMMUNITY): Payer: Medicare Other

## 2022-10-20 DIAGNOSIS — I2510 Atherosclerotic heart disease of native coronary artery without angina pectoris: Secondary | ICD-10-CM | POA: Diagnosis present

## 2022-10-20 DIAGNOSIS — Z8249 Family history of ischemic heart disease and other diseases of the circulatory system: Secondary | ICD-10-CM | POA: Diagnosis not present

## 2022-10-20 DIAGNOSIS — I10 Essential (primary) hypertension: Secondary | ICD-10-CM | POA: Diagnosis present

## 2022-10-20 DIAGNOSIS — K219 Gastro-esophageal reflux disease without esophagitis: Secondary | ICD-10-CM | POA: Diagnosis present

## 2022-10-20 DIAGNOSIS — M869 Osteomyelitis, unspecified: Secondary | ICD-10-CM | POA: Diagnosis present

## 2022-10-20 DIAGNOSIS — L97508 Non-pressure chronic ulcer of other part of unspecified foot with other specified severity: Secondary | ICD-10-CM | POA: Diagnosis not present

## 2022-10-20 DIAGNOSIS — L03031 Cellulitis of right toe: Secondary | ICD-10-CM | POA: Diagnosis present

## 2022-10-20 DIAGNOSIS — M170 Bilateral primary osteoarthritis of knee: Secondary | ICD-10-CM | POA: Diagnosis present

## 2022-10-20 DIAGNOSIS — Z955 Presence of coronary angioplasty implant and graft: Secondary | ICD-10-CM | POA: Diagnosis not present

## 2022-10-20 DIAGNOSIS — E876 Hypokalemia: Secondary | ICD-10-CM | POA: Diagnosis present

## 2022-10-20 DIAGNOSIS — Z7982 Long term (current) use of aspirin: Secondary | ICD-10-CM | POA: Diagnosis not present

## 2022-10-20 DIAGNOSIS — Z87891 Personal history of nicotine dependence: Secondary | ICD-10-CM | POA: Diagnosis not present

## 2022-10-20 DIAGNOSIS — I252 Old myocardial infarction: Secondary | ICD-10-CM | POA: Diagnosis not present

## 2022-10-20 DIAGNOSIS — Z79899 Other long term (current) drug therapy: Secondary | ICD-10-CM | POA: Diagnosis not present

## 2022-10-20 DIAGNOSIS — Z85828 Personal history of other malignant neoplasm of skin: Secondary | ICD-10-CM | POA: Diagnosis not present

## 2022-10-20 DIAGNOSIS — L97519 Non-pressure chronic ulcer of other part of right foot with unspecified severity: Secondary | ICD-10-CM | POA: Diagnosis present

## 2022-10-20 DIAGNOSIS — M1611 Unilateral primary osteoarthritis, right hip: Secondary | ICD-10-CM | POA: Diagnosis present

## 2022-10-20 DIAGNOSIS — Z96651 Presence of right artificial knee joint: Secondary | ICD-10-CM | POA: Diagnosis present

## 2022-10-20 DIAGNOSIS — M86171 Other acute osteomyelitis, right ankle and foot: Secondary | ICD-10-CM | POA: Diagnosis present

## 2022-10-20 DIAGNOSIS — Z96641 Presence of right artificial hip joint: Secondary | ICD-10-CM | POA: Diagnosis present

## 2022-10-20 DIAGNOSIS — Z8614 Personal history of Methicillin resistant Staphylococcus aureus infection: Secondary | ICD-10-CM | POA: Diagnosis not present

## 2022-10-20 LAB — SEDIMENTATION RATE: Sed Rate: 7 mm/hr (ref 0–16)

## 2022-10-20 LAB — CBC
HCT: 41.8 % (ref 39.0–52.0)
Hemoglobin: 14.5 g/dL (ref 13.0–17.0)
MCH: 32.7 pg (ref 26.0–34.0)
MCHC: 34.7 g/dL (ref 30.0–36.0)
MCV: 94.4 fL (ref 80.0–100.0)
Platelets: 542 10*3/uL — ABNORMAL HIGH (ref 150–400)
RBC: 4.43 MIL/uL (ref 4.22–5.81)
RDW: 13.2 % (ref 11.5–15.5)
WBC: 6.7 10*3/uL (ref 4.0–10.5)
nRBC: 0 % (ref 0.0–0.2)

## 2022-10-20 LAB — BASIC METABOLIC PANEL
Anion gap: 9 (ref 5–15)
BUN: 17 mg/dL (ref 8–23)
CO2: 24 mmol/L (ref 22–32)
Calcium: 8.7 mg/dL — ABNORMAL LOW (ref 8.9–10.3)
Chloride: 101 mmol/L (ref 98–111)
Creatinine, Ser: 0.98 mg/dL (ref 0.61–1.24)
GFR, Estimated: >60 mL/min (ref >60)
Glucose, Bld: 105 mg/dL — ABNORMAL HIGH (ref 70–99)
Potassium: 3.1 mmol/L — ABNORMAL LOW (ref 3.5–5.1)
Sodium: 134 mmol/L — ABNORMAL LOW (ref 135–145)

## 2022-10-20 LAB — PROCALCITONIN: Procalcitonin: <0.1 ng/mL

## 2022-10-20 LAB — C-REACTIVE PROTEIN: CRP: <0.5 mg/dL (ref <1.0)

## 2022-10-20 LAB — CREATININE, SERUM
Creatinine, Ser: 1.01 mg/dL (ref 0.61–1.24)
GFR, Estimated: >60 mL/min (ref >60)

## 2022-10-20 MED ORDER — VANCOMYCIN HCL IN DEXTROSE 1-5 GM/200ML-% IV SOLN
1000.0000 mg | INTRAVENOUS | Status: DC
  Administered 2022-10-20: 1000 mg via INTRAVENOUS
  Filled 2022-10-20 (×2): qty 200

## 2022-10-20 MED ORDER — POTASSIUM CHLORIDE CRYS ER 20 MEQ PO TBCR
40.0000 meq | EXTENDED_RELEASE_TABLET | Freq: Once | ORAL | Status: AC
  Administered 2022-10-20: 40 meq via ORAL
  Filled 2022-10-20: qty 2

## 2022-10-20 MED ORDER — VITAMIN D 25 MCG (1000 UNIT) PO TABS
1000.0000 [IU] | ORAL_TABLET | Freq: Every day | ORAL | Status: DC
  Administered 2022-10-20 – 2022-10-23 (×4): 1000 [IU] via ORAL
  Filled 2022-10-20 (×4): qty 1

## 2022-10-20 MED ORDER — ACETAMINOPHEN 650 MG RE SUPP: 650.0000 mg | Freq: Four times a day (QID) | RECTAL | Status: DC | PRN

## 2022-10-20 MED ORDER — SACCHAROMYCES BOULARDII 250 MG PO CAPS
250.0000 mg | ORAL_CAPSULE | Freq: Every day | ORAL | Status: DC
Start: 2022-10-20 — End: 2022-10-23
  Administered 2022-10-20 – 2022-10-23 (×4): 250 mg via ORAL
  Filled 2022-10-20 (×4): qty 1

## 2022-10-20 MED ORDER — ACETAMINOPHEN 325 MG PO TABS: 650.0000 mg | ORAL_TABLET | Freq: Four times a day (QID) | ORAL | Status: DC | PRN

## 2022-10-20 MED ORDER — ONDANSETRON HCL 4 MG/2ML IJ SOLN: 4.0000 mg | Freq: Four times a day (QID) | INTRAMUSCULAR | Status: DC | PRN

## 2022-10-20 MED ORDER — OXYCODONE HCL 5 MG PO TABS
5.0000 mg | ORAL_TABLET | ORAL | Status: DC | PRN
  Administered 2022-10-23: 5 mg via ORAL
  Filled 2022-10-20: qty 1

## 2022-10-20 MED ORDER — NITROGLYCERIN 0.4 MG SL SUBL: 0.4000 mg | SUBLINGUAL_TABLET | SUBLINGUAL | Status: DC | PRN

## 2022-10-20 MED ORDER — BENAZEPRIL HCL 20 MG PO TABS
20.0000 mg | ORAL_TABLET | Freq: Every day | ORAL | Status: DC
  Administered 2022-10-20 – 2022-10-21 (×2): 20 mg via ORAL
  Filled 2022-10-20 (×3): qty 1

## 2022-10-20 MED ORDER — PIPERACILLIN-TAZOBACTAM 3.375 G IVPB
3.3750 g | Freq: Three times a day (TID) | INTRAVENOUS | Status: DC
  Administered 2022-10-20 – 2022-10-21 (×4): 3.375 g via INTRAVENOUS
  Filled 2022-10-20 (×4): qty 50

## 2022-10-20 MED ORDER — BENAZEPRIL-HYDROCHLOROTHIAZIDE 20-25 MG PO TABS: 1.0000 | ORAL_TABLET | Freq: Every day | ORAL | Status: DC

## 2022-10-20 MED ORDER — HEPARIN SODIUM (PORCINE) 5000 UNIT/ML IJ SOLN
5000.0000 [IU] | Freq: Three times a day (TID) | INTRAMUSCULAR | Status: DC
  Administered 2022-10-20 – 2022-10-23 (×8): 5000 [IU] via SUBCUTANEOUS
  Filled 2022-10-20 (×9): qty 1

## 2022-10-20 MED ORDER — GADOBUTROL 1 MMOL/ML IV SOLN
7.0000 mL | Freq: Once | INTRAVENOUS | Status: AC | PRN
  Administered 2022-10-20: 7 mL via INTRAVENOUS

## 2022-10-20 MED ORDER — ROSUVASTATIN CALCIUM 20 MG PO TABS
20.0000 mg | ORAL_TABLET | ORAL | Status: DC
  Administered 2022-10-20 – 2022-10-22 (×2): 20 mg via ORAL
  Filled 2022-10-20: qty 2
  Filled 2022-10-20: qty 1

## 2022-10-20 MED ORDER — HYDROCHLOROTHIAZIDE 25 MG PO TABS
25.0000 mg | ORAL_TABLET | Freq: Every day | ORAL | Status: DC
  Administered 2022-10-20 – 2022-10-21 (×2): 25 mg via ORAL
  Filled 2022-10-20 (×3): qty 1

## 2022-10-20 MED ORDER — ADULT MULTIVITAMIN W/MINERALS CH
1.0000 | ORAL_TABLET | Freq: Every day | ORAL | Status: DC
  Administered 2022-10-20 – 2022-10-22 (×3): 1 via ORAL
  Filled 2022-10-20 (×4): qty 1

## 2022-10-20 MED ORDER — SODIUM CHLORIDE 0.9 % IV SOLN: INTRAVENOUS | Status: AC

## 2022-10-20 MED ORDER — ONDANSETRON HCL 4 MG PO TABS: 4.0000 mg | ORAL_TABLET | Freq: Four times a day (QID) | ORAL | Status: DC | PRN

## 2022-10-20 NOTE — H&P (Addendum)
History and Physical    [name] [name]:[id number] DOB: [birth year] DOA: 10/19/2022  PCP: Nelwyn Salisbury, MD  Patient coming from: home  I have personally briefly reviewed patient's old medical records in [facility]  Chief Complaint:  right toe infection HPI: [name] [name] is a 87 y.o. male with medical history significant of CADs/p  MI s/p DES, GERD, MRSA, HTN,  who presents to ED with complaint of swelling and redness of right second toe x 5days. Due to progression of redness and swelling patient went to UC at which point he had xray which was concerning for possible infection in the bone. AT that juncture patient was referred to ED for further evaluation.  Patient noted no pain , no fever/no chills/ no body aches. ON further ros no chest pain ,sob/ n/v/d/    ED Course:  Vitals: afeb, bp 155/68,  hr 54, rr 16 sat 100%  IN ED patient has MRI which was on prelim red + for osteomyelitis of the right 2nd toe  Patient was then started on broad spectrum abx in ed and slated to admission to medicine.   Right foot xray IMPRESSION: Progressive hallux valgus deformity of the first ray with scattered degenerative changes.   Diffuse soft tissue swelling particularly of second digit. There is also some lucency suggested involving the proximal phalanx of the second digit. With a history and area of osteomyelitis is possible. Confirmatory MRI or bone scan could be considered as clinically appropriate for further delineation.   Labs  Wbc 7.9, plt 491, hgb14.6  N a138, K 3.5, cl 102 cr 1.07   Tx zosyn/vanc Review of Systems: As per HPI otherwise 10 point review of systems negative.   Past Medical History:  Diagnosis Date   Abscess of groin, left    Arthritis    R hip & knees   Cancer (HCC)    basal cell, history of    Closed fracture of right foot    a. 08/2014 Fx of R fifth metatarsal (Jones Fx).   Coronary artery disease    a. 11/2007 Cath/PCI: LM nl, LAD 40-90m, d1  40-50ost, LCX nl, RCA 90d (Platinum Study DES - promus), EF 60%.   Gastroesophageal reflux disease with hiatal hernia    H/O hiatal hernia    Hx MRSA infection    Hypertension    Pre-syncope    a. 08/2014 w/ sinus bradycardia in 40's in ED.    Past Surgical History:  Procedure Laterality Date   APPENDECTOMY     CARDIAC CATHETERIZATION  12/06/07   60%   COLONOSCOPY  06/08/2015   per Dr. Ewing Schlein, no polyps    CORONARY ANGIOPLASTY WITH STENT PLACEMENT  11/2007   HERNIA REPAIR     KNEE ARTHROSCOPY Right 10/03/2012   Procedure: RIGHT KNEEE ARTHROSCOPY WITH DEBRIDEMENT ;  Surgeon: Loanne Drilling, MD;  Location: [facility];  Service: Orthopedics;  Laterality: Right;   SHOULDER SURGERY  05/2016   rotator cuff and ligament repair    TOTAL HIP ARTHROPLASTY Right 04/05/2013   Procedure: RIGHT TOTAL HIP ARTHROPLASTY ANTERIOR APPROACH;  Surgeon: Loanne Drilling, MD;  Location: [facility] OR;  Service: Orthopedics;  Laterality: Right;   TOTAL KNEE ARTHROPLASTY Right 10/30/2017   Procedure: RIGHT TOTAL KNEE ARTHROPLASTY;  Surgeon: Ollen Gross, MD;  Location: [facility] ORS;  Service: Orthopedics;  Laterality: Right;   YAG LASER APPLICATION     for glaucoma - both eyes, no drops as of yet  reports that he quit smoking about 61 years ago. His smoking use included cigarettes. He has never used smokeless tobacco. He reports that he does not currently use alcohol. He reports that he does not use drugs.  No Known Allergies  Family History  Problem Relation Age of Onset   Hypertension Sister    Hypertension Brother    Stomach cancer Father        family hx   Hypertension Other        family hx   Heart disease Other        family hx   Heart attack Neg Hx    Stroke Neg Hx    Prior to Admission medications   Medication Sig Start Date End Date Taking? Authorizing Provider  aspirin 81 MG EC tablet Take 81 mg by mouth daily. Swallow whole.   Yes [provider]  benazepril-hydrochlorthiazide (LOTENSIN  HCT) 20-25 MG tablet Take 1 tablet by mouth daily. 10/11/22  Yes Wendall Stade, MD  cholecalciferol (VITAMIN D) 1000 UNITS tablet Take 1,000 Units by mouth daily.    Yes [provider]  Multiple Vitamin (MULTIVITAMIN WITH MINERALS) TABS tablet Take 1 tablet by mouth daily with breakfast.   Yes [provider]  nitroGLYCERIN (NITROSTAT) 0.4 MG SL tablet Place 1 tablet (0.4 mg total) under the tongue every 5 (five) minutes as needed for chest pain (3 doses MAX). 04/28/22  Yes Wendall Stade, MD  Probiotic Product (PROBIOTIC PO) Take 1 capsule by mouth daily with breakfast.   Yes [provider]  rosuvastatin (CRESTOR) 20 MG tablet TAKE 1 TABLET BY MOUTH EVERY OTHER DAY 10/27/21  Yes Wendall Stade, MD  sulfamethoxazole-trimethoprim (BACTRIM DS) 800-160 MG tablet Take 1 tablet by mouth 2 (two) times daily for 7 days. 10/19/22 10/26/22 Yes HortonClabe Seal, DO    Physical Exam: Vitals:   10/19/22 2100 10/19/22 2115 10/19/22 2300 10/20/22 0018  BP: (!) 149/75 (!) 156/75 (!) 155/72 (!) 148/91  Pulse: (!) 59 (!) 58 67 68  Resp: 16  16 16   Temp:    97.8 F (36.6 C)  TempSrc:    Oral  SpO2: 99% 100% 99% 98%  Weight:      Height:        Constitutional: NAD, calm, comfortable Vitals:   10/19/22 2100 10/19/22 2115 10/19/22 2300 10/20/22 0018  BP: (!) 149/75 (!) 156/75 (!) 155/72 (!) 148/91  Pulse: (!) 59 (!) 58 67 68  Resp: 16  16 16   Temp:    97.8 F (36.6 C)  TempSrc:    Oral  SpO2: 99% 100% 99% 98%  Weight:      Height:       Eyes: PERRL, lids and conjunctivae normal ENMT: Mucous membranes are moist. Posterior pharynx clear of any exudate or lesions.Normal dentition.  Neck: normal, supple, no masses, no thyromegaly Respiratory: clear to auscultation bilaterally, no wheezing, no crackles. Normal respiratory effort. No accessory muscle use.  Cardiovascular: Regular rate and rhythm, no murmurs / rubs / gallops. No extremity edema. 2+ pedal pulses. No carotid  bruits.  Abdomen: no tenderness, no masses palpated. No hepatosplenomegaly. Bowel sounds positive.  Musculoskeletal: no clubbing / cyanosis. No joint deformity upper and lower extremities. Good ROM, no contractures. Normal muscle tone.  Right second toe with notes swelling and redness and drainage , but non tender Skin: no rashes, lesions, ulcers. No induration Neurologic: CN 2-12 grossly intact. Sensation intact. Strength 5/5 in all 4.  Psychiatric: Alert  and oriented x 3. Normal mood.    Labs on Admission: I have personally reviewed following labs and imaging studies  CBC: Recent Labs  Lab 10/19/22 2024  WBC 7.9  NEUTROABS 5.3  HGB 14.6  HCT 43.3  MCV 96.7  PLT 491*   Basic Metabolic Panel: Recent Labs  Lab 10/19/22 2024  NA 138  K 3.5  CL 102  CO2 25  GLUCOSE 94  BUN 22  CREATININE 1.07  CALCIUM 9.1   GFR: Estimated Creatinine Clearance: 48.5 mL/min (by C-G formula based on SCr of 1.07 mg/dL). Liver Function Tests: Recent Labs  Lab 10/19/22 2024  AST 19  ALT 17  ALKPHOS 56  BILITOT 0.8  PROT 7.5  ALBUMIN 4.1   No results for input(s): "LIPASE", "AMYLASE" in the last 168 hours. No results for input(s): "AMMONIA" in the last 168 hours. Coagulation Profile: No results for input(s): "INR", "PROTIME" in the last 168 hours. Cardiac Enzymes: No results for input(s): "CKTOTAL", "CKMB", "CKMBINDEX", "TROPONINI" in the last 168 hours. BNP (last 3 results) No results for input(s): "PROBNP" in the last 8760 hours. HbA1C: No results for input(s): "HGBA1C" in the last 72 hours. CBG: No results for input(s): "GLUCAP" in the last 168 hours. Lipid Profile: No results for input(s): "CHOL", "HDL", "LDLCALC", "TRIG", "CHOLHDL", "LDLDIRECT" in the last 72 hours. Thyroid Function Tests: No results for input(s): "TSH", "T4TOTAL", "FREET4", "T3FREE", "THYROIDAB" in the last 72 hours. Anemia Panel: No results for input(s): "VITAMINB12", "FOLATE", "FERRITIN", "TIBC", "IRON",  "RETICCTPCT" in the last 72 hours. Urine analysis:    Component Value Date/Time   COLORURINE YELLOW 10/19/2017 1000   APPEARANCEUR CLEAR 10/19/2017 1000   LABSPEC 1.016 10/19/2017 1000   PHURINE 6.0 10/19/2017 1000   GLUCOSEU NEGATIVE 10/19/2017 1000   HGBUR NEGATIVE 10/19/2017 1000   HGBUR negative 02/28/2007 0000   BILIRUBINUR NEGATIVE 10/19/2017 1000   KETONESUR NEGATIVE 10/19/2017 1000   PROTEINUR NEGATIVE 10/19/2017 1000   UROBILINOGEN 1.0 03/26/2013 1119   NITRITE NEGATIVE 10/19/2017 1000   LEUKOCYTESUR NEGATIVE 10/19/2017 1000    Radiological Exams on Admission: DG Foot Complete Right  Result Date: 10/19/2022 CLINICAL DATA:  Second toe cellulitis for 7 days. EXAM: RIGHT FOOT COMPLETE - 3 VIEW COMPARISON:  Foot x-ray 08/31/2014 FINDINGS: Diffuse vascular calcifications. Preserved bone mineralization. Diffuse soft tissue swelling about the foot, greatest involving the second digit. Scattered mild degenerative changes are identified. There is hallux valgus deformity of the first ray with some hypertrophic changes. Stable subchondral cysts along the first metatarsal head. There is deformity involving the distal aspect of the proximal phalanx of second digit with some lucency. There is also some deformity involving the second metatarsophalangeal joint. With the swelling and history, an area of infection is possible. IMPRESSION: Progressive hallux valgus deformity of the first ray with scattered degenerative changes. Diffuse soft tissue swelling particularly of second digit. There is also some lucency suggested involving the proximal phalanx of the second digit. With a history and area of osteomyelitis is possible. Confirmatory MRI or bone scan could be considered as clinically appropriate for further delineation. Electronically Signed   By: Karen Kays M.D.   On: 10/19/2022 18:45    EKG: Independently reviewed. N/a  Assessment/Plan  Osteomyelitis of right second toe  -hx of mrsa  -  continue with vanc /zosn  -crp  -podiatry consult in am  -f/u on blood cultures   CADs/p  MI s/p DES -continue asa/statin /ACEI -intolerant to betablocker due to symptomatic bradycardia  GERD -continue ppi    HTN -stable bp     DVT prophylaxis: heparin Code Status: full/ as discussed per patient wishes in event of cardiac arrest  Family Communication none at bedside Disposition Plan: patient  expected to be admitted greater than 2 midnights  Consults called: Podiatry please call in am  Roney Mans   Lurline Del MD [facility]   If 7PM-7AM, please contact night-coverage [email or web address] Password [email or web address]  10/20/2022, 2:48 AM

## 2022-10-20 NOTE — TOC CM/SW Note (Signed)
Transition of Care [facility]) - Inpatient Brief Assessment   Patient Details  Name: [name] [name] MRN: [id number] Date of Birth: 21-Jul-1935  Transition of Care [facility]) CM/SW Contact:    Durenda Guthrie, RN Phone Number: 10/20/2022, 3:36 PM   Clinical Narrative:    Transition of Care Asessment: Insurance and Status: Insurance coverage has been reviewed Patient has primary care physician: Yes (Dr. Shellia Carwin) Home environment has been reviewed:  (lives home with wife) Prior level of function:: independent Prior/Current Home Services: No current home services Social Determinants of Health Reivew: SDOH reviewed no interventions necessary Readmission risk has been reviewed: No Transition of care needs: no transition of care needs at this time

## 2022-10-20 NOTE — Progress Notes (Signed)
Pt arrived to unit via wheelchair. Alert and oriented x 4, slight unsteady gait d/t R toe trauma.oriented to room and callbell with no complications. Pain 0/10. Tele box #63. Initial assessment and 2 RN assessment completed. Will continue to monitor.

## 2022-10-20 NOTE — ED Notes (Signed)
ED TO INPATIENT HANDOFF REPORT  Name/Age/Gender [name] [name] 87 y.o. male  Code Status    Code Status Orders  (From admission, onward)           Start     Ordered   10/20/22 0313  Full code  Continuous       Question:  By:  Answer:  Default: patient does not have capacity for decision making, no surrogate or prior directive available   10/20/22 0315           Code Status History     Date Active Date Inactive Code Status Order ID Comments User Context   10/30/2017 1027 11/01/2017 1526 Full Code [id number]  Ollen Gross, MD Inpatient   04/05/2013 1953 04/07/2013 1710 Full Code [id number]  Loanne Drilling, MD Inpatient       Home/SNF/Other Home  Chief Complaint Osteomyelitis [facility]) [M86.9]  Level of Care/Admitting Diagnosis ED Disposition     ED Disposition  Admit   Condition  --   Comment  Hospital Area: [facility] [[id number]]  Level of Care: Telemetry [5]  Admit to tele based on following criteria: Other see comments  Comments: infection  May admit patient to [facility] or [facility] if equivalent level of care is available:: No  Covid Evaluation: Asymptomatic - no recent exposure (last 10 days) testing not required  Diagnosis: Osteomyelitis [facility]) [[id number]]  Admitting Physician: Lurline Del [[id number]]  Attending Physician: Lurline Del [[id number]]  Certification:: I certify this patient will need inpatient services for at least 2 midnights  Estimated Length of Stay: 3          Medical History Past Medical History:  Diagnosis Date   Abscess of groin, left    Arthritis    R hip & knees   Cancer (HCC)    basal cell, history of    Closed fracture of right foot    a. 08/2014 Fx of R fifth metatarsal (Jones Fx).   Coronary artery disease    a. 11/2007 Cath/PCI: LM nl, LAD 40-46m, d1 40-50ost, LCX nl, RCA 90d (Platinum Study DES - promus), EF 60%.   Gastroesophageal reflux disease with hiatal hernia    H/O hiatal  hernia    Hx MRSA infection    Hypertension    Pre-syncope    a. 08/2014 w/ sinus bradycardia in 40's in ED.    Allergies No Known Allergies  IV Location/Drains/Wounds Patient Lines/Drains/Airways Status     Active Line/Drains/Airways     Name Placement date Placement time Site Days   Peripheral IV 10/19/22 22 G Left Antecubital 10/19/22  2007  Antecubital  1   Incision 04/05/13 Hip Right 04/05/13  1723  -- 3485   Incision (Closed) 02/27/14 Shoulder Right 02/27/14  1127  -- 3157   Incision (Closed) 10/30/17 Knee Right 10/30/17  0812  -- 1816   Incision - 3 Ports Other (Comment) Anterior;Right;Upper Medial;Anterior Left;Anterior;Upper 10/03/12  1325  -- 3669            Labs/Imaging Results for orders placed or performed during the hospital encounter of 10/19/22 (from the past 48 hour(s))  CBC with Differential     Status: Abnormal   Collection Time: 10/19/22  8:24 PM  Result Value Ref Range   WBC 7.9 4.0 - 10.5 K/uL   RBC 4.48 4.22 - 5.81 MIL/uL   Hemoglobin 14.6 13.0 - 17.0 g/dL   HCT 29.5 18.8 - 41.6 %   MCV 96.7 80.0 -  100.0 fL   MCH 32.6 26.0 - 34.0 pg   MCHC 33.7 30.0 - 36.0 g/dL   RDW 16.1 09.6 - 04.5 %   Platelets 491 (H) 150 - 400 K/uL   nRBC 0.0 0.0 - 0.2 %   Neutrophils Relative % 66 %   Neutro Abs 5.3 1.7 - 7.7 K/uL   Lymphocytes Relative 18 %   Lymphs Abs 1.4 0.7 - 4.0 K/uL   Monocytes Relative 12 %   Monocytes Absolute 1.0 0.1 - 1.0 K/uL   Eosinophils Relative 2 %   Eosinophils Absolute 0.1 0.0 - 0.5 K/uL   Basophils Relative 1 %   Basophils Absolute 0.0 0.0 - 0.1 K/uL   Immature Granulocytes 1 %   Abs Immature Granulocytes 0.04 0.00 - 0.07 K/uL    Comment: Performed at [facility], [address] [place], [place] [address]  Comprehensive metabolic panel     Status: None   Collection Time: 10/19/22  8:24 PM  Result Value Ref Range   Sodium 138 135 - 145 mmol/L   Potassium 3.5 3.5 - 5.1 mmol/L   Chloride 102 98 - 111 mmol/L    CO2 25 22 - 32 mmol/L   Glucose, Bld 94 70 - 99 mg/dL    Comment: Glucose reference range applies only to samples taken after fasting for at least 8 hours.   BUN 22 8 - 23 mg/dL   Creatinine, Ser 1.91 0.61 - 1.24 mg/dL   Calcium 9.1 8.9 - 47.8 mg/dL   Total Protein 7.5 6.5 - 8.1 g/dL   Albumin 4.1 3.5 - 5.0 g/dL   AST 19 15 - 41 U/L   ALT 17 0 - 44 U/L   Alkaline Phosphatase 56 38 - 126 U/L   Total Bilirubin 0.8 0.3 - 1.2 mg/dL   GFR, Estimated >29 >56 mL/min    Comment: (NOTE) Calculated using the CKD-EPI Creatinine Equation (2021)    Anion gap 11 5 - 15    Comment: Performed at [facility], [address] [place], [place] [address]   DG Foot Complete Right  Result Date: 10/19/2022 CLINICAL DATA:  Second toe cellulitis for 7 days. EXAM: RIGHT FOOT COMPLETE - 3 VIEW COMPARISON:  Foot x-ray 08/31/2014 FINDINGS: Diffuse vascular calcifications. Preserved bone mineralization. Diffuse soft tissue swelling about the foot, greatest involving the second digit. Scattered mild degenerative changes are identified. There is hallux valgus deformity of the first ray with some hypertrophic changes. Stable subchondral cysts along the first metatarsal head. There is deformity involving the distal aspect of the proximal phalanx of second digit with some lucency. There is also some deformity involving the second metatarsophalangeal joint. With the swelling and history, an area of infection is possible. IMPRESSION: Progressive hallux valgus deformity of the first ray with scattered degenerative changes. Diffuse soft tissue swelling particularly of second digit. There is also some lucency suggested involving the proximal phalanx of the second digit. With a history and area of osteomyelitis is possible. Confirmatory MRI or bone scan could be considered as clinically appropriate for further delineation. Electronically Signed   By: Karen Kays M.D.   On: 10/19/2022 18:45    Pending  Labs Unresulted Labs (From admission, onward)     Start     Ordered   10/20/22 0317  C-reactive protein  Once,   R        10/20/22 0316   10/20/22 0317  Procalcitonin  Once,   R  References:    Procalcitonin Lower Respiratory Tract Infection AND Sepsis Procalcitonin Algorithm   10/20/22 0316   10/20/22 0313  CBC  (heparin)  Once,   R       Comments: Baseline for heparin therapy IF NOT ALREADY DRAWN.  Notify MD if PLT < 100 K.    10/20/22 0315   10/20/22 0313  Creatinine, serum  (heparin)  Once,   R       Comments: Baseline for heparin therapy IF NOT ALREADY DRAWN.    10/20/22 0315   10/20/22 0114  Blood culture (routine x 2)  BLOOD CULTURE X 2,   R (with STAT occurrences)     Question Answer Comment  Patient immune status Normal   Release to patient Immediate      10/20/22 0113            Vitals/Pain Today's Vitals   10/19/22 2100 10/19/22 2115 10/19/22 2300 10/20/22 0018  BP: (!) 149/75 (!) 156/75 (!) 155/72 (!) 148/91  Pulse: (!) 59 (!) 58 67 68  Resp: 16  16 16   Temp:    97.8 F (36.6 C)  TempSrc:    Oral  SpO2: 99% 100% 99% 98%  Weight:      Height:      PainSc:        Isolation Precautions No active isolations  Medications Medications  benazepril-hydrochlorthiazide (LOTENSIN HCT) 20-25 MG per tablet 1 tablet (has no administration in time range)  nitroGLYCERIN (NITROSTAT) SL tablet 0.4 mg (has no administration in time range)  rosuvastatin (CRESTOR) tablet 20 mg (has no administration in time range)  cholecalciferol (VITAMIN D3) 25 MCG (1000 UNIT) tablet 1,000 Units (has no administration in time range)  multivitamin with minerals tablet 1 tablet (has no administration in time range)  Probiotic CAPS (has no administration in time range)  heparin injection 5,000 Units (has no administration in time range)  0.9 %  sodium chloride infusion (has no administration in time range)  acetaminophen (TYLENOL) tablet 650 mg (has no administration in time range)     Or  acetaminophen (TYLENOL) suppository 650 mg (has no administration in time range)  ondansetron (ZOFRAN) tablet 4 mg (has no administration in time range)    Or  ondansetron (ZOFRAN) injection 4 mg (has no administration in time range)  oxyCODONE (Oxy IR/ROXICODONE) immediate release tablet 5 mg (has no administration in time range)  vancomycin (VANCOCIN) IVPB 1000 mg/200 mL premix (0 mg Intravenous Stopped 10/20/22 0046)  piperacillin-tazobactam (ZOSYN) IVPB 3.375 g (0 g Intravenous Stopped 10/19/22 2302)  gadobutrol (GADAVIST) 1 MMOL/ML injection 7 mL (7 mLs Intravenous Contrast Given 10/20/22 0007)    Mobility walks

## 2022-10-20 NOTE — Plan of Care (Signed)

## 2022-10-20 NOTE — Progress Notes (Signed)
Pharmacy Antibiotic Note  [name] [name] is a 87 y.o. male admitted on 10/19/2022 with cellulitis/osteomyelitis of right second toe.  In the ED patient received Vancomycin 1gm IV and Zosyn 3.375gm IV x 1 dose each.  Pharmacy has been consulted for Vancomycin and Zosyn dosing.  Plan: Zosyn 3.375g IV q8h (4 hour infusion). Vancomycin 1000 mg IV Q 24 hrs. Goal AUC 400-550.  Expected AUC: 457  SCr used: 1.07 Daily SCr while on both Vancomycin and Zosyn F/u culture results and sensitivities   Height: 5\' 11"  (180.3 cm) Weight: 68 kg (150 lb) IBW/kg (Calculated) : 75.3  Temp (24hrs), Avg:97.8 F (36.6 C), Min:97.6 F (36.4 C), Max:97.9 F (36.6 C)  Recent Labs  Lab 10/19/22 2024  WBC 7.9  CREATININE 1.07    Estimated Creatinine Clearance: 48.5 mL/min (by C-G formula based on SCr of 1.07 mg/dL).    No Known Allergies  Antimicrobials this admission: 6/5 Zosyn >>   6/5 Vancomycin >>    Dose adjustments this admission:    Microbiology results: 6/6 BCx:      Thank you for allowing pharmacy to be a part of this patient's care.  Maryellen Pile, PharmD 10/20/2022 3:32 AM

## 2022-10-20 NOTE — Progress Notes (Signed)
Patient seen and examined, admitted by Dr. Skip Mayer this morning.  Briefly 87 year old male with a history of CAD, GERD, MRSA, HTN presented with swelling and redness of right second toe for 5 days.  Due to progression of redness and swelling, patient went to urgent care, x-ray was concerning for possible infection in the bone.  Patient was referred to ED for further evaluation.  BP 137/75 (BP Location: Right Arm)   Pulse 69   Temp 97.7 F (36.5 C) (Oral)   Resp 16   Ht 5\' 11"  (1.803 m)   Wt 60 kg   SpO2 99%   BMI 18.45 kg/m   Labs reviewed MRI right foot showed focal soft tissue ulcer at the dorsal aspect of the second toe at distal aspect of proximal phalanx, osteomyelitis of second toe proximal phalanx and third phalanx, probable septic arthritis of second toe PIP joint.  Physical Exam General: Alert and oriented x 3, NAD Cardiovascular: S1 S2 clear, RRR.  Respiratory: CTAB, no wheezing, rales or rhonchi Gastrointestinal: Soft, nontender, nondistended, NBS Ext: no pedal edema bilaterally Neuro: no new deficits Skin: Right second toe with tenderness swelling, redness, drainage Psych: Normal affect   A/p   Right second toe wound with osteomyelitis, probable septic arthritis of second toe PIP joint -Continue IV vancomycin, Zosyn -Hemodynamically stable, obtain ABIs -Discussed with Dr Blanchie Dessert and Dr. Lajoyce Corners, plan for possible OR tomorrow, recommended transfer to [facility] -Will place n.p.o. after midnight -Discussed with patient and wife at bedside in detail.  Hypokalemia -Replaced   Uchechi Denison M.D.  [facility] 10/20/2022, 11:44 AM

## 2022-10-20 NOTE — Progress Notes (Signed)
MD ordered transfer to [facility].  When transfer order was placed, it generated a bed request.  5N32C approved. Patient and wife made aware.   Report was called and [facility] setup.  [facility] transported patient to room 5N32C.  Levora Angel, RN

## 2022-10-21 ENCOUNTER — Inpatient Hospital Stay (HOSPITAL_COMMUNITY): Payer: Medicare Other

## 2022-10-21 DIAGNOSIS — L97508 Non-pressure chronic ulcer of other part of unspecified foot with other specified severity: Secondary | ICD-10-CM

## 2022-10-21 DIAGNOSIS — M86171 Other acute osteomyelitis, right ankle and foot: Secondary | ICD-10-CM | POA: Diagnosis not present

## 2022-10-21 LAB — CBC
HCT: 45.7 % (ref 39.0–52.0)
Hemoglobin: 15.8 g/dL (ref 13.0–17.0)
MCH: 33.1 pg (ref 26.0–34.0)
MCHC: 34.6 g/dL (ref 30.0–36.0)
MCV: 95.6 fL (ref 80.0–100.0)
Platelets: 505 10*3/uL — ABNORMAL HIGH (ref 150–400)
RBC: 4.78 MIL/uL (ref 4.22–5.81)
RDW: 13.2 % (ref 11.5–15.5)
WBC: 8.4 10*3/uL (ref 4.0–10.5)
nRBC: 0 % (ref 0.0–0.2)

## 2022-10-21 LAB — BASIC METABOLIC PANEL
Anion gap: 11 (ref 5–15)
BUN: 14 mg/dL (ref 8–23)
CO2: 23 mmol/L (ref 22–32)
Calcium: 9.2 mg/dL (ref 8.9–10.3)
Chloride: 103 mmol/L (ref 98–111)
Creatinine, Ser: 1.29 mg/dL — ABNORMAL HIGH (ref 0.61–1.24)
GFR, Estimated: 54 mL/min — ABNORMAL LOW (ref >60)
Glucose, Bld: 97 mg/dL (ref 70–99)
Potassium: 3.6 mmol/L (ref 3.5–5.1)
Sodium: 137 mmol/L (ref 135–145)

## 2022-10-21 LAB — SURGICAL PCR SCREEN
MRSA, PCR: NEGATIVE
Staphylococcus aureus: NEGATIVE

## 2022-10-21 MED ORDER — ASPIRIN 81 MG PO TBEC
81.0000 mg | DELAYED_RELEASE_TABLET | Freq: Every day | ORAL | Status: DC
  Administered 2022-10-22 – 2022-10-23 (×2): 81 mg via ORAL
  Filled 2022-10-21 (×2): qty 1

## 2022-10-21 MED ORDER — ASPIRIN 81 MG PO TBEC: 81.0000 mg | DELAYED_RELEASE_TABLET | Freq: Every day | ORAL | Status: DC

## 2022-10-21 MED ORDER — SODIUM CHLORIDE 0.9 % IV SOLN: INTRAVENOUS | Status: DC

## 2022-10-21 MED ORDER — VANCOMYCIN HCL IN DEXTROSE 1-5 GM/200ML-% IV SOLN
1000.0000 mg | INTRAVENOUS | Status: DC
  Administered 2022-10-21 – 2022-10-22 (×2): 1000 mg via INTRAVENOUS
  Filled 2022-10-21 (×2): qty 200

## 2022-10-21 MED ORDER — ACETAMINOPHEN 325 MG PO TABS: 650.0000 mg | ORAL_TABLET | Freq: Four times a day (QID) | ORAL | Status: DC | PRN

## 2022-10-21 MED ORDER — SODIUM CHLORIDE 0.9 % IV SOLN
2.0000 g | INTRAVENOUS | Status: AC
  Administered 2022-10-21 – 2022-10-23 (×3): 2 g via INTRAVENOUS
  Filled 2022-10-21 (×3): qty 20

## 2022-10-21 MED ORDER — METRONIDAZOLE 500 MG/100ML IV SOLN
500.0000 mg | Freq: Two times a day (BID) | INTRAVENOUS | Status: AC
  Administered 2022-10-21 – 2022-10-23 (×5): 500 mg via INTRAVENOUS
  Filled 2022-10-21 (×5): qty 100

## 2022-10-21 NOTE — Consult Note (Signed)
ORTHOPAEDIC CONSULTATION  REQUESTING PHYSICIAN: Lonia Blood, MD  Chief Complaint: Osteomyelitis right foot second toe.  HPI: [name] [name] is a 87 y.o. male who presents with swelling cellulitis ulceration and osteomyelitis right foot second toe.  Past Medical History:  Diagnosis Date   Abscess of groin, left    Arthritis    R hip & knees   Cancer (HCC)    basal cell, history of    Closed fracture of right foot    a. 08/2014 Fx of R fifth metatarsal (Jones Fx).   Coronary artery disease    a. 11/2007 Cath/PCI: LM nl, LAD 40-54m, d1 40-50ost, LCX nl, RCA 90d (Platinum Study DES - promus), EF 60%.   Gastroesophageal reflux disease with hiatal hernia    H/O hiatal hernia    Hx MRSA infection    Hypertension    Pre-syncope    a. 08/2014 w/ sinus bradycardia in 40's in ED.   Past Surgical History:  Procedure Laterality Date   APPENDECTOMY     CARDIAC CATHETERIZATION  12/06/07   60%   COLONOSCOPY  06/08/2015   per Dr. Ewing Schlein, no polyps    CORONARY ANGIOPLASTY WITH STENT PLACEMENT  11/2007   HERNIA REPAIR     KNEE ARTHROSCOPY Right 10/03/2012   Procedure: RIGHT KNEEE ARTHROSCOPY WITH DEBRIDEMENT ;  Surgeon: Loanne Drilling, MD;  Location: [facility];  Service: Orthopedics;  Laterality: Right;   SHOULDER SURGERY  05/2016   rotator cuff and ligament repair    TOTAL HIP ARTHROPLASTY Right 04/05/2013   Procedure: RIGHT TOTAL HIP ARTHROPLASTY ANTERIOR APPROACH;  Surgeon: Loanne Drilling, MD;  Location: [facility];  Service: Orthopedics;  Laterality: Right;   TOTAL KNEE ARTHROPLASTY Right 10/30/2017   Procedure: RIGHT TOTAL KNEE ARTHROPLASTY;  Surgeon: Ollen Gross, MD;  Location: [facility];  Service: Orthopedics;  Laterality: Right;   YAG LASER APPLICATION     for glaucoma - both eyes, no drops as of yet    Social History   Socioeconomic History   Marital status: Married    Spouse name: Not on file   Number of children: Not on file   Years of education: Not on file   Highest  education level: Not on file  Occupational History   Not on file  Tobacco Use   Smoking status: Former    Types: Cigarettes    Quit date: 09/28/1961    Years since quitting: 61.1   Smokeless tobacco: Never  Vaping Use   Vaping Use: Never used  Substance and Sexual Activity   Alcohol use: Not Currently    Comment: occ   Drug use: No   Sexual activity: Not on file  Other Topics Concern   Not on file  Social History Narrative   Not on file   Social Determinants of Health   Financial Resource Strain: Not on file  Food Insecurity: No Food Insecurity (10/20/2022)   Hunger Vital Sign    Worried About Running Out of Food in the Last Year: Never true    Ran Out of Food in the Last Year: Never true  Transportation Needs: No Transportation Needs (10/20/2022)   PRAPARE - Administrator, Civil Service (Medical): No    Lack of Transportation (Non-Medical): No  Physical Activity: Not on file  Stress: Not on file  Social Connections: Not on file   Family History  Problem Relation Age of Onset   Hypertension Sister    Hypertension Brother  Stomach cancer Father        family hx   Hypertension Other        family hx   Heart disease Other        family hx   Heart attack Neg Hx    Stroke Neg Hx    - negative except otherwise stated in the family history section No Known Allergies Prior to Admission medications   Medication Sig Start Date End Date Taking? Authorizing Provider  aspirin 81 MG EC tablet Take 81 mg by mouth daily. Swallow whole.   Yes [provider]  benazepril-hydrochlorthiazide (LOTENSIN HCT) 20-25 MG tablet Take 1 tablet by mouth daily. 10/11/22  Yes Wendall Stade, MD  cholecalciferol (VITAMIN D) 1000 UNITS tablet Take 1,000 Units by mouth daily.    Yes [provider]  Multiple Vitamin (MULTIVITAMIN WITH MINERALS) TABS tablet Take 1 tablet by mouth daily with breakfast.   Yes [provider]  nitroGLYCERIN (NITROSTAT) 0.4 MG SL  tablet Place 1 tablet (0.4 mg total) under the tongue every 5 (five) minutes as needed for chest pain (3 doses MAX). 04/28/22  Yes Wendall Stade, MD  Probiotic Product (PROBIOTIC PO) Take 1 capsule by mouth daily with breakfast.   Yes [provider]  rosuvastatin (CRESTOR) 20 MG tablet TAKE 1 TABLET BY MOUTH EVERY OTHER DAY 10/27/21  Yes Wendall Stade, MD  sulfamethoxazole-trimethoprim (BACTRIM DS) 800-160 MG tablet Take 1 tablet by mouth 2 (two) times daily for 7 days. 10/19/22 10/26/22 Yes Horton, Clabe Seal, DO   VAS [place] ABI WITH/WO TBI  Result Date: 10/21/2022  LOWER EXTREMITY DOPPLER STUDY Patient Name:  [name] [name]  Date of Exam:   10/21/2022 Medical Rec #: [id number]          Accession #:    [id number] Date of Birth: [birth year]           Patient Gender: M Patient Age:   86 years Exam Location:  [facility] Procedure:      VAS [place] ABI WITH/WO TBI Referring Phys: DANIEL MARCHWIANY --------------------------------------------------------------------------------  Indications: Osteomyelitis right foot 2nd digit  Limitations: Today's exam was limited due to involuntary patient movement. Comparison Study: No previous exams Performing Technologist: Hill, Jody RVT, RDMS  Examination Guidelines: A complete evaluation includes at minimum, Doppler waveform signals and systolic blood pressure reading at the level of bilateral brachial, anterior tibial, and posterior tibial arteries, when vessel segments are accessible. Bilateral testing is considered an integral part of a complete examination. Photoelectric Plethysmograph (PPG) waveforms and toe systolic pressure readings are included as required and additional duplex testing as needed. Limited examinations for reoccurring indications may be performed as noted.  ABI Findings: +---------+------------------+-----+---------+--------+ Right    Rt Pressure (mmHg)IndexWaveform Comment  +---------+------------------+-----+---------+--------+ Brachial  102                    triphasic         +---------+------------------+-----+---------+--------+ PTA      120               1.03 biphasic          +---------+------------------+-----+---------+--------+ DP       150               1.29 biphasic          +---------+------------------+-----+---------+--------+ Great Toe72                0.62 Normal            +---------+------------------+-----+---------+--------+ +---------+------------------+-----+---------+----------------+  Left     Lt Pressure (mmHg)IndexWaveform Comment          +---------+------------------+-----+---------+----------------+ Brachial 116                    triphasic                 +---------+------------------+-----+---------+----------------+ PTA                             biphasic Non compressible +---------+------------------+-----+---------+----------------+ DP                              biphasic Non compressible +---------+------------------+-----+---------+----------------+ Great Toe110               0.95 Normal                    +---------+------------------+-----+---------+----------------+ +-------+-----------+-----------+------------+------------+ ABI/TBIToday's ABIToday's TBIPrevious ABIPrevious TBI +-------+-----------+-----------+------------+------------+ Right  1.29       0.62                                +-------+-----------+-----------+------------+------------+ Left   [place]         0.95                                +-------+-----------+-----------+------------+------------+ LLE doppler waveforms and ABI difficult to obtain due to continuous patient foot movement.  Summary: Right: Resting right ankle-brachial index is within normal range. The right toe-brachial index is mildly abnormal. Left: Resting left ankle-brachial index indicates noncompressible left lower extremity arteries. The left toe-brachial index is normal. *See table(s) above for  measurements and observations.     Preliminary    MR FOOT RIGHT W WO CONTRAST  Result Date: 10/20/2022 CLINICAL DATA:  Foot swelling. Diabetic. Osteomyelitis suspected. 87 year old male presents to the emergency department with right second toe infection. Patient believes he had some rubbing orientation on that toe couple of days ago. Noticed it was red and worsening so went to urgent care today. X-ray showed concern for lucency and referred to ED for osteomyelitis. EXAM: MRI OF THE RIGHT FOREFOOT WITHOUT AND WITH CONTRAST TECHNIQUE: Multiplanar, multisequence MR imaging of the right 4 5 was performed before and after the administration of intravenous contrast. CONTRAST:  7mL GADAVIST GADOBUTROL 1 MMOL/ML IV SOLN COMPARISON:  Right foot radiographs 10/19/2022 and 08/31/2014 FINDINGS: Bones/Joint/Cartilage As described below, there is a focal soft tissue ulcer at the dorsal aspect of the second toe at the distal portion of the proximal phalanx (sagittal series 8, image 16). Adjacent to this, there is a focal 3 mm erosion within the distal dorsal cortex of the proximal phalanx. High-grade marrow edema is seen throughout the distal greater than proximal aspect of the second toe proximal phalanx with more mild marrow edema within the middle phalanx. Mild PIP joint effusion extending to the dorsal skin ulcer, concerning for septic arthritis. There is also approximately 9 mm dorsal displacement indicating an apparent dislocation or at least high-grade subluxation of the proximal phalanx of the 2nd toe with respect to the metatarsal head (sagittal series 8, image 17). There is mild-to-moderate peripheral spurring on both sides of the second metatarsophalangeal joint. This dislocation and degenerative change account for the irregularity seen overlying the second metatarsophalangeal joint on 10/19/2022 radiographs. High-grade hallux valgus with  moderate great toe metatarsophalangeal cartilage thinning and mild peripheral  osteophytosis. There is likely degenerative cystic change and marrow edema within the lateral great toe metatarsophalangeal joint plantar sesamoids. Degenerative cystic changes within the anterior metatarsal head at the articulation with the medial sesamoid. Mild great toe metatarsophalangeal joint effusion. Mild-to-moderate third and fourth interphalangeal cartilage thinning with mild subchondral marrow edema that is presumably degenerative. Recommend clinical correlation for any possible concerns for infection within the third or fourth toe. High-grade degenerative change within the third tarsometatarsal joint including full-thickness cartilage loss and moderate lateral subchondral cystic change with mild peripheral degenerative osteophytes (sagittal series 8, image 11 and coronal series 7, image 13). Ligaments The Lisfranc ligament complex is intact. There is thickening of the medial and lateral collateral ligaments of the second metatarsophalangeal joint, likely sprains related sedate 20 dislocation. Muscles and Tendons There is mild scattered nonspecific for edema within the musculature. No tendon tear is seen. Soft tissues There is moderate soft tissue swelling of the second toe as seen on 10/19/2022 radiographs. There is an apparent small soft tissue defect within the dorsal aspect of second toe at the distal aspect of the proximal phalanx (sagittal series 8, image 16). There is mild erosion within the adjacent distal dorsal aspect of the proximal phalanx cortex measuring 3 mm in length (also sagittal series 8, image 16). Mild peripheral lateral forefoot subcutaneous fat edema and swelling. IMPRESSION: 1. Focal soft tissue ulcer at the dorsal aspect of the second toe at the distal aspect of the proximal phalanx. There is a focal 3 mm cortical erosion within the distal dorsal aspect of the second toe proximal phalanx. Findings are suspicious for indicative of osteomyelitis of the second toe proximal phalanx  and third phalanx. Probable septic arthritis of the second toe PIP joint. 2. Dorsal dislocation of the proximal phalanx of the 2nd toe with respect to the metatarsal head. 3. High-grade hallux valgus with moderate great toe metatarsophalangeal cartilage thinning and mild peripheral osteophytosis. 4. High-grade degenerative change within the third tarsometatarsal joint including full-thickness cartilage loss and moderate lateral subchondral cystic change with mild peripheral degenerative osteophytes. 5. Mild-to-moderate third and fourth interphalangeal cartilage thinning with mild subchondral marrow edema that is presumably degenerative. Recommend clinical correlation for any possible concerns for infection within the third or fourth toe. Electronically Signed   By: Neita Garnet M.D.   On: 10/20/2022 09:34   DG Foot Complete Right  Result Date: 10/19/2022 CLINICAL DATA:  Second toe cellulitis for 7 days. EXAM: RIGHT FOOT COMPLETE - 3 VIEW COMPARISON:  Foot x-ray 08/31/2014 FINDINGS: Diffuse vascular calcifications. Preserved bone mineralization. Diffuse soft tissue swelling about the foot, greatest involving the second digit. Scattered mild degenerative changes are identified. There is hallux valgus deformity of the first ray with some hypertrophic changes. Stable subchondral cysts along the first metatarsal head. There is deformity involving the distal aspect of the proximal phalanx of second digit with some lucency. There is also some deformity involving the second metatarsophalangeal joint. With the swelling and history, an area of infection is possible. IMPRESSION: Progressive hallux valgus deformity of the first ray with scattered degenerative changes. Diffuse soft tissue swelling particularly of second digit. There is also some lucency suggested involving the proximal phalanx of the second digit. With a history and area of osteomyelitis is possible. Confirmatory MRI or bone scan could be considered as  clinically appropriate for further delineation. Electronically Signed   By: Karen Kays M.D.   On: 10/19/2022 18:45   -  pertinent xrays, CT, MRI studies were reviewed and independently interpreted  Positive ROS: All other systems have been reviewed and were otherwise negative with the exception of those mentioned in the HPI and as above.  Physical Exam: General: Alert, no acute distress Psychiatric: Patient is competent for consent with normal mood and affect Lymphatic: No axillary or cervical lymphadenopathy Cardiovascular: No pedal edema Respiratory: No cyanosis, no use of accessory musculature GI: No organomegaly, abdomen is soft and non-tender    Images:  @ENCIMAGES @  Labs:  Lab Results  Component Value Date   HGBA1C  12/07/2007    5.5 (NOTE)   The ADA recommends the following therapeutic goal for glycemic   control related to Hgb A1C measurement:   Goal of Therapy:   < 7.0% Hgb A1C   Reference: American Diabetes Association: Clinical Practice   Recommendations 2008, Diabetes Care,  2008, 31:(Suppl 1).   ESRSEDRATE 7 10/20/2022   CRP <0.5 10/20/2022   REPTSTATUS PENDING 10/20/2022   CULT  10/20/2022    NO GROWTH 1 DAY Performed at [facility], [address] [place], [place] [address]     Lab Results  Component Value Date   ALBUMIN 4.1 10/19/2022   ALBUMIN 4.8 01/09/2019   ALBUMIN 4.6 10/19/2017        Latest Ref Rng & Units 10/21/2022    8:15 AM 10/20/2022    7:12 AM 10/19/2022    8:24 PM  CBC EXTENDED  WBC 4.0 - 10.5 K/uL 8.4  6.7  7.9   RBC 4.22 - 5.81 MIL/uL 4.78  4.43  4.48   Hemoglobin 13.0 - 17.0 g/dL 82.9  56.2  13.0   HCT 39.0 - 52.0 % 45.7  41.8  43.3   Platelets 150 - 400 K/uL 505  542  491   NEUT# 1.7 - 7.7 K/uL   5.3   Lymph# 0.7 - 4.0 K/uL   1.4     Neurologic: Patient does not have protective sensation bilateral lower extremities.   MUSCULOSKELETAL:   Skin: Examination there is cellulitis sausage digit swelling and ulceration right  foot second toe.  Patient has a palpable dorsalis pedis and posterior tibial pulse.  Review of the MRI scan shows osteomyelitis of the second toe.  Ankle-brachial indices shows adequate circulation with multiphasic flow.  Hemoglobin 15.8.    Assessment: Assessment: Osteomyelitis right foot second toe.  Plan: Plan: Will plan for right foot second toe amputation.  Risk and benefits were discussed including risk of the wound not healing need for additional surgery.  Patient and his wife state they understand wish to proceed at this time.  Surgery scheduled for Saturday morning 10 AM.  Thank you for the consult and the opportunity to see Mr. Aquan Kope, MD [facility] [phone] 5:41 PM

## 2022-10-21 NOTE — Progress Notes (Signed)
[name] [name]  WGN:[id number] DOB: [birth year] DOA: 10/19/2022 PCP: Nelwyn Salisbury, MD    Brief Narrative:  87 year old with a history of CAD status post MI with DES, HTN, and prior MRSA infection who presented to the ER with swelling and redness of his right second toe x 4 days.  X-rays were obtained and suggested possible osteomyelitis of the area.  Consultants:  Dr. Lajoyce Corners  Goals of Care:  Code Status: Full Code   DVT prophylaxis: Subcutaneous heparin  Interim Hx: Afebrile.  Vital signs stable.  Creatinine has increased over the last 24 hours.  Resting comfortably in bed.  Denies chest pain or shortness of breath.  No significant pain of foot or toe at present time.  Assessment & Plan:  Osteomyelitis of right second toe Utilizing Vanc as well as broad coverage given history of MRSA infection - Dr. Lajoyce Corners to evaluate in consultation   Hypokalemia Corrected with supplementation  CAD w/ MI s/p DES Continue his usual aspirin, statin, and ACE inhibitor -of note patient is intolerant to BB due to symptomatic bradycardia  Reported hx of MRSA  Review of chart notes no prior + cultures, and multiple remote MRSA nasal PCR screens that are all negative - cont Vanc for now, but will have low threshold to stop if crt continues to climb   HTN Blood pressure reasonably controlled at present  Family Communication: Spoke with wife at bedside Disposition: Depends upon surgical plans   Objective: Blood pressure 133/64, pulse 78, temperature 98.8 F (37.1 C), temperature source Oral, resp. rate 17, height 5\' 11"  (1.803 m), weight 60 kg, SpO2 98 %.  Intake/Output Summary (Last 24 hours) at 10/21/2022 1011 Last data filed at 10/20/2022 2300 Gross per 24 hour  Intake 1116.54 ml  Output 375 ml  Net 741.54 ml   Filed Weights   10/19/22 1951 10/20/22 0500 10/20/22 1644  Weight: 68 kg 60 kg 60 kg    Examination: General: No acute respiratory distress Lungs: Clear to auscultation bilaterally  without wheezes or crackles Cardiovascular: Regular rate and rhythm without murmur gallop or rub normal S1 and S2 Abdomen: Nontender, nondistended, soft, bowel sounds positive, no rebound, no ascites, no appreciable mass Extremities: No significant cyanosis, clubbing, or edema bilateral lower extremities -no erythema of forefoot or right lower extremity apart from focused area on right second toe  CBC: Recent Labs  Lab 10/19/22 2024 10/20/22 0712 10/21/22 0815  WBC 7.9 6.7 8.4  NEUTROABS 5.3  --   --   HGB 14.6 14.5 15.8  HCT 43.3 41.8 45.7  MCV 96.7 94.4 95.6  PLT 491* 542* 505*   Basic Metabolic Panel: Recent Labs  Lab 10/19/22 2024 10/20/22 0405 10/20/22 0712 10/21/22 0815  NA 138  --  134* 137  K 3.5  --  3.1* 3.6  CL 102  --  101 103  CO2 25  --  24 23  GLUCOSE 94  --  105* 97  BUN 22  --  17 14  CREATININE 1.07 1.01 0.98 1.29*  CALCIUM 9.1  --  8.7* 9.2   GFR: Estimated Creatinine Clearance: 35.5 mL/min (A) (by C-G formula based on SCr of 1.29 mg/dL (H)).   Scheduled Meds:  benazepril  20 mg Oral Daily   And   hydrochlorothiazide  25 mg Oral Daily   cholecalciferol  1,000 Units Oral Daily   heparin  5,000 Units Subcutaneous Q8H   multivitamin with minerals  1 tablet Oral Q breakfast   rosuvastatin  20 mg Oral QODAY   saccharomyces boulardii  250 mg Oral Daily   Continuous Infusions:  piperacillin-tazobactam (ZOSYN)  IV 3.375 g (10/21/22 0426)   vancomycin 1,000 mg (10/20/22 2106)     LOS: 1 day   Lonia Blood, MD [facility] Office  [phone] Pager - Text Page per Loretha Stapler  If 7PM-7AM, please contact night-coverage per Amion 10/21/2022, 10:11 AM

## 2022-10-21 NOTE — H&P (View-Only) (Signed)
 ORTHOPAEDIC CONSULTATION  REQUESTING PHYSICIAN: McClung, Jeffrey T, MD  Chief Complaint: Osteomyelitis right foot second toe.  HPI: [name] [name] is a 87 y.o. male who presents with swelling cellulitis ulceration and osteomyelitis right foot second toe.  Past Medical History:  Diagnosis Date   Abscess of groin, left    Arthritis    R hip & knees   Cancer (HCC)    basal cell, history of    Closed fracture of right foot    a. 08/2014 Fx of R fifth metatarsal (Jones Fx).   Coronary artery disease    a. 11/2007 Cath/PCI: LM nl, LAD 40-50m, d1 40-50ost, LCX nl, RCA 90d (Platinum Study DES - promus), EF 60%.   Gastroesophageal reflux disease with hiatal hernia    H/O hiatal hernia    Hx MRSA infection    Hypertension    Pre-syncope    a. 08/2014 w/ sinus bradycardia in 40's in ED.   Past Surgical History:  Procedure Laterality Date   APPENDECTOMY     CARDIAC CATHETERIZATION  12/06/07   60%   COLONOSCOPY  06/08/2015   per Dr. Magod, no polyps    CORONARY ANGIOPLASTY WITH STENT PLACEMENT  11/2007   HERNIA REPAIR     KNEE ARTHROSCOPY Right 10/03/2012   Procedure: RIGHT KNEEE ARTHROSCOPY WITH DEBRIDEMENT ;  Surgeon: Frank V Aluisio, MD;  Location: [facility];  Service: Orthopedics;  Laterality: Right;   SHOULDER SURGERY  05/2016   rotator cuff and ligament repair    TOTAL HIP ARTHROPLASTY Right 04/05/2013   Procedure: RIGHT TOTAL HIP ARTHROPLASTY ANTERIOR APPROACH;  Surgeon: Frank V Aluisio, MD;  Location: [facility];  Service: Orthopedics;  Laterality: Right;   TOTAL KNEE ARTHROPLASTY Right 10/30/2017   Procedure: RIGHT TOTAL KNEE ARTHROPLASTY;  Surgeon: Aluisio, Frank, MD;  Location: [facility];  Service: Orthopedics;  Laterality: Right;   YAG LASER APPLICATION     for glaucoma - both eyes, no drops as of yet    Social History   Socioeconomic History   Marital status: Married    Spouse name: Not on file   Number of children: Not on file   Years of education: Not on file   Highest  education level: Not on file  Occupational History   Not on file  Tobacco Use   Smoking status: Former    Types: Cigarettes    Quit date: 09/28/1961    Years since quitting: 61.1   Smokeless tobacco: Never  Vaping Use   Vaping Use: Never used  Substance and Sexual Activity   Alcohol use: Not Currently    Comment: occ   Drug use: No   Sexual activity: Not on file  Other Topics Concern   Not on file  Social History Narrative   Not on file   Social Determinants of Health   Financial Resource Strain: Not on file  Food Insecurity: No Food Insecurity (10/20/2022)   Hunger Vital Sign    Worried About Running Out of Food in the Last Year: Never true    Ran Out of Food in the Last Year: Never true  Transportation Needs: No Transportation Needs (10/20/2022)   PRAPARE - Transportation    Lack of Transportation (Medical): No    Lack of Transportation (Non-Medical): No  Physical Activity: Not on file  Stress: Not on file  Social Connections: Not on file   Family History  Problem Relation Age of Onset   Hypertension Sister    Hypertension Brother      Stomach cancer Father        family hx   Hypertension Other        family hx   Heart disease Other        family hx   Heart attack Neg Hx    Stroke Neg Hx    - negative except otherwise stated in the family history section No Known Allergies Prior to Admission medications   Medication Sig Start Date End Date Taking? Authorizing Provider  aspirin 81 MG EC tablet Take 81 mg by mouth daily. Swallow whole.   Yes [provider]  benazepril-hydrochlorthiazide (LOTENSIN HCT) 20-25 MG tablet Take 1 tablet by mouth daily. 10/11/22  Yes Nishan, Peter C, MD  cholecalciferol (VITAMIN D) 1000 UNITS tablet Take 1,000 Units by mouth daily.    Yes [provider]  Multiple Vitamin (MULTIVITAMIN WITH MINERALS) TABS tablet Take 1 tablet by mouth daily with breakfast.   Yes [provider]  nitroGLYCERIN (NITROSTAT) 0.4 MG SL  tablet Place 1 tablet (0.4 mg total) under the tongue every 5 (five) minutes as needed for chest pain (3 doses MAX). 04/28/22  Yes Nishan, Peter C, MD  Probiotic Product (PROBIOTIC PO) Take 1 capsule by mouth daily with breakfast.   Yes [provider]  rosuvastatin (CRESTOR) 20 MG tablet TAKE 1 TABLET BY MOUTH EVERY OTHER DAY 10/27/21  Yes Nishan, Peter C, MD  sulfamethoxazole-trimethoprim (BACTRIM DS) 800-160 MG tablet Take 1 tablet by mouth 2 (two) times daily for 7 days. 10/19/22 10/26/22 Yes Horton, Kristie M, DO   VAS US ABI WITH/WO TBI  Result Date: 10/21/2022  LOWER EXTREMITY DOPPLER STUDY Patient Name:  [name] [name]  Date of Exam:   10/21/2022 Medical Rec #: [id number]          Accession #:    [id number] Date of Birth: 02/10/1936           Patient Gender: M Patient Age:   87 years Exam Location:  [facility] Procedure:      VAS US ABI WITH/WO TBI Referring Phys: DANIEL MARCHWIANY --------------------------------------------------------------------------------  Indications: Osteomyelitis right foot 2nd digit  Limitations: Today's exam was limited due to involuntary patient movement. Comparison Study: No previous exams Performing Technologist: Hill, Jody RVT, RDMS  Examination Guidelines: A complete evaluation includes at minimum, Doppler waveform signals and systolic blood pressure reading at the level of bilateral brachial, anterior tibial, and posterior tibial arteries, when vessel segments are accessible. Bilateral testing is considered an integral part of a complete examination. Photoelectric Plethysmograph (PPG) waveforms and toe systolic pressure readings are included as required and additional duplex testing as needed. Limited examinations for reoccurring indications may be performed as noted.  ABI Findings: +---------+------------------+-----+---------+--------+ Right    Rt Pressure (mmHg)IndexWaveform Comment  +---------+------------------+-----+---------+--------+ Brachial  102                    triphasic         +---------+------------------+-----+---------+--------+ PTA      120               1.03 biphasic          +---------+------------------+-----+---------+--------+ DP       150               1.29 biphasic          +---------+------------------+-----+---------+--------+ Great Toe72                0.62 Normal            +---------+------------------+-----+---------+--------+ +---------+------------------+-----+---------+----------------+   Left     Lt Pressure (mmHg)IndexWaveform Comment          +---------+------------------+-----+---------+----------------+ Brachial 116                    triphasic                 +---------+------------------+-----+---------+----------------+ PTA                             biphasic Non compressible +---------+------------------+-----+---------+----------------+ DP                              biphasic Non compressible +---------+------------------+-----+---------+----------------+ Great Toe110               0.95 Normal                    +---------+------------------+-----+---------+----------------+ +-------+-----------+-----------+------------+------------+ ABI/TBIToday's ABIToday's TBIPrevious ABIPrevious TBI +-------+-----------+-----------+------------+------------+ Right  1.29       0.62                                +-------+-----------+-----------+------------+------------+ Left   [place]         0.95                                +-------+-----------+-----------+------------+------------+ LLE doppler waveforms and ABI difficult to obtain due to continuous patient foot movement.  Summary: Right: Resting right ankle-brachial index is within normal range. The right toe-brachial index is mildly abnormal. Left: Resting left ankle-brachial index indicates noncompressible left lower extremity arteries. The left toe-brachial index is normal. *See table(s) above for  measurements and observations.     Preliminary    MR FOOT RIGHT W WO CONTRAST  Result Date: 10/20/2022 CLINICAL DATA:  Foot swelling. Diabetic. Osteomyelitis suspected. 87-year-old male presents to the emergency department with right second toe infection. Patient believes he had some rubbing orientation on that toe couple of days ago. Noticed it was red and worsening so went to urgent care today. X-ray showed concern for lucency and referred to ED for osteomyelitis. EXAM: MRI OF THE RIGHT FOREFOOT WITHOUT AND WITH CONTRAST TECHNIQUE: Multiplanar, multisequence MR imaging of the right 4 5 was performed before and after the administration of intravenous contrast. CONTRAST:  7mL GADAVIST GADOBUTROL 1 MMOL/ML IV SOLN COMPARISON:  Right foot radiographs 10/19/2022 and 08/31/2014 FINDINGS: Bones/Joint/Cartilage As described below, there is a focal soft tissue ulcer at the dorsal aspect of the second toe at the distal portion of the proximal phalanx (sagittal series 8, image 16). Adjacent to this, there is a focal 3 mm erosion within the distal dorsal cortex of the proximal phalanx. High-grade marrow edema is seen throughout the distal greater than proximal aspect of the second toe proximal phalanx with more mild marrow edema within the middle phalanx. Mild PIP joint effusion extending to the dorsal skin ulcer, concerning for septic arthritis. There is also approximately 9 mm dorsal displacement indicating an apparent dislocation or at least high-grade subluxation of the proximal phalanx of the 2nd toe with respect to the metatarsal head (sagittal series 8, image 17). There is mild-to-moderate peripheral spurring on both sides of the second metatarsophalangeal joint. This dislocation and degenerative change account for the irregularity seen overlying the second metatarsophalangeal joint on 10/19/2022 radiographs. High-grade hallux valgus with   moderate great toe metatarsophalangeal cartilage thinning and mild peripheral  osteophytosis. There is likely degenerative cystic change and marrow edema within the lateral great toe metatarsophalangeal joint plantar sesamoids. Degenerative cystic changes within the anterior metatarsal head at the articulation with the medial sesamoid. Mild great toe metatarsophalangeal joint effusion. Mild-to-moderate third and fourth interphalangeal cartilage thinning with mild subchondral marrow edema that is presumably degenerative. Recommend clinical correlation for any possible concerns for infection within the third or fourth toe. High-grade degenerative change within the third tarsometatarsal joint including full-thickness cartilage loss and moderate lateral subchondral cystic change with mild peripheral degenerative osteophytes (sagittal series 8, image 11 and coronal series 7, image 13). Ligaments The Lisfranc ligament complex is intact. There is thickening of the medial and lateral collateral ligaments of the second metatarsophalangeal joint, likely sprains related sedate 20 dislocation. Muscles and Tendons There is mild scattered nonspecific for edema within the musculature. No tendon tear is seen. Soft tissues There is moderate soft tissue swelling of the second toe as seen on 10/19/2022 radiographs. There is an apparent small soft tissue defect within the dorsal aspect of second toe at the distal aspect of the proximal phalanx (sagittal series 8, image 16). There is mild erosion within the adjacent distal dorsal aspect of the proximal phalanx cortex measuring 3 mm in length (also sagittal series 8, image 16). Mild peripheral lateral forefoot subcutaneous fat edema and swelling. IMPRESSION: 1. Focal soft tissue ulcer at the dorsal aspect of the second toe at the distal aspect of the proximal phalanx. There is a focal 3 mm cortical erosion within the distal dorsal aspect of the second toe proximal phalanx. Findings are suspicious for indicative of osteomyelitis of the second toe proximal phalanx  and third phalanx. Probable septic arthritis of the second toe PIP joint. 2. Dorsal dislocation of the proximal phalanx of the 2nd toe with respect to the metatarsal head. 3. High-grade hallux valgus with moderate great toe metatarsophalangeal cartilage thinning and mild peripheral osteophytosis. 4. High-grade degenerative change within the third tarsometatarsal joint including full-thickness cartilage loss and moderate lateral subchondral cystic change with mild peripheral degenerative osteophytes. 5. Mild-to-moderate third and fourth interphalangeal cartilage thinning with mild subchondral marrow edema that is presumably degenerative. Recommend clinical correlation for any possible concerns for infection within the third or fourth toe. Electronically Signed   By: Ronald  Viola M.D.   On: 10/20/2022 09:34   DG Foot Complete Right  Result Date: 10/19/2022 CLINICAL DATA:  Second toe cellulitis for 7 days. EXAM: RIGHT FOOT COMPLETE - 3 VIEW COMPARISON:  Foot x-ray 08/31/2014 FINDINGS: Diffuse vascular calcifications. Preserved bone mineralization. Diffuse soft tissue swelling about the foot, greatest involving the second digit. Scattered mild degenerative changes are identified. There is hallux valgus deformity of the first ray with some hypertrophic changes. Stable subchondral cysts along the first metatarsal head. There is deformity involving the distal aspect of the proximal phalanx of second digit with some lucency. There is also some deformity involving the second metatarsophalangeal joint. With the swelling and history, an area of infection is possible. IMPRESSION: Progressive hallux valgus deformity of the first ray with scattered degenerative changes. Diffuse soft tissue swelling particularly of second digit. There is also some lucency suggested involving the proximal phalanx of the second digit. With a history and area of osteomyelitis is possible. Confirmatory MRI or bone scan could be considered as  clinically appropriate for further delineation. Electronically Signed   By: Ashok  Gupta M.D.   On: 10/19/2022 18:45   -   pertinent xrays, CT, MRI studies were reviewed and independently interpreted  Positive ROS: All other systems have been reviewed and were otherwise negative with the exception of those mentioned in the HPI and as above.  Physical Exam: General: Alert, no acute distress Psychiatric: Patient is competent for consent with normal mood and affect Lymphatic: No axillary or cervical lymphadenopathy Cardiovascular: No pedal edema Respiratory: No cyanosis, no use of accessory musculature GI: No organomegaly, abdomen is soft and non-tender    Images:  @ENCIMAGES@  Labs:  Lab Results  Component Value Date   HGBA1C  12/07/2007    5.5 (NOTE)   The ADA recommends the following therapeutic goal for glycemic   control related to Hgb A1C measurement:   Goal of Therapy:   < 7.0% Hgb A1C   Reference: American Diabetes Association: Clinical Practice   Recommendations 2008, Diabetes Care,  2008, 31:(Suppl 1).   ESRSEDRATE 7 10/20/2022   CRP <0.5 10/20/2022   REPTSTATUS PENDING 10/20/2022   CULT  10/20/2022    NO GROWTH 1 DAY Performed at [facility], [address] [place], [place] [address]     Lab Results  Component Value Date   ALBUMIN 4.1 10/19/2022   ALBUMIN 4.8 01/09/2019   ALBUMIN 4.6 10/19/2017        Latest Ref Rng & Units 10/21/2022    8:15 AM 10/20/2022    7:12 AM 10/19/2022    8:24 PM  CBC EXTENDED  WBC 4.0 - 10.5 K/uL 8.4  6.7  7.9   RBC 4.22 - 5.81 MIL/uL 4.78  4.43  4.48   Hemoglobin 13.0 - 17.0 g/dL 15.8  14.5  14.6   HCT 39.0 - 52.0 % 45.7  41.8  43.3   Platelets 150 - 400 K/uL 505  542  491   NEUT# 1.7 - 7.7 K/uL   5.3   Lymph# 0.7 - 4.0 K/uL   1.4     Neurologic: Patient does not have protective sensation bilateral lower extremities.   MUSCULOSKELETAL:   Skin: Examination there is cellulitis sausage digit swelling and ulceration right  foot second toe.  Patient has a palpable dorsalis pedis and posterior tibial pulse.  Review of the MRI scan shows osteomyelitis of the second toe.  Ankle-brachial indices shows adequate circulation with multiphasic flow.  Hemoglobin 15.8.    Assessment: Assessment: Osteomyelitis right foot second toe.  Plan: Plan: Will plan for right foot second toe amputation.  Risk and benefits were discussed including risk of the wound not healing need for additional surgery.  Patient and his wife state they understand wish to proceed at this time.  Surgery scheduled for Saturday morning 10 AM.  Thank you for the consult and the opportunity to see Mr. Ransom  Lavaughn Haberle, MD [facility] [phone] 5:41 PM      

## 2022-10-21 NOTE — Plan of Care (Signed)

## 2022-10-21 NOTE — Progress Notes (Signed)
ABI has been completed.   Results can be found under chart review under CV PROC. 10/21/2022 2:27 PM Khilynn Borntreger RVT, RDMS

## 2022-10-22 ENCOUNTER — Encounter (HOSPITAL_COMMUNITY): Payer: Self-pay | Admitting: Internal Medicine

## 2022-10-22 ENCOUNTER — Inpatient Hospital Stay (HOSPITAL_COMMUNITY): Payer: Medicare Other | Admitting: Anesthesiology

## 2022-10-22 ENCOUNTER — Encounter (HOSPITAL_COMMUNITY)
Admission: EM | Disposition: A | Discharge status: Home Health | Payer: Self-pay | Source: Home / Self Care | Attending: Internal Medicine

## 2022-10-22 DIAGNOSIS — I2510 Atherosclerotic heart disease of native coronary artery without angina pectoris: Secondary | ICD-10-CM

## 2022-10-22 DIAGNOSIS — M86171 Other acute osteomyelitis, right ankle and foot: Secondary | ICD-10-CM | POA: Diagnosis not present

## 2022-10-22 DIAGNOSIS — I10 Essential (primary) hypertension: Secondary | ICD-10-CM

## 2022-10-22 DIAGNOSIS — M869 Osteomyelitis, unspecified: Secondary | ICD-10-CM | POA: Diagnosis not present

## 2022-10-22 DIAGNOSIS — Z87891 Personal history of nicotine dependence: Secondary | ICD-10-CM

## 2022-10-22 HISTORY — PX: AMPUTATION: SHX166

## 2022-10-22 LAB — CBC
HCT: 39.1 % (ref 39.0–52.0)
Hemoglobin: 13.6 g/dL (ref 13.0–17.0)
MCH: 32.8 pg (ref 26.0–34.0)
MCHC: 34.8 g/dL (ref 30.0–36.0)
MCV: 94.2 fL (ref 80.0–100.0)
Platelets: 460 10*3/uL — ABNORMAL HIGH (ref 150–400)
RBC: 4.15 MIL/uL — ABNORMAL LOW (ref 4.22–5.81)
RDW: 13.3 % (ref 11.5–15.5)
WBC: 7.7 10*3/uL (ref 4.0–10.5)
nRBC: 0 % (ref 0.0–0.2)

## 2022-10-22 LAB — BASIC METABOLIC PANEL
Anion gap: 12 (ref 5–15)
BUN: 15 mg/dL (ref 8–23)
CO2: 25 mmol/L (ref 22–32)
Calcium: 8.7 mg/dL — ABNORMAL LOW (ref 8.9–10.3)
Chloride: 102 mmol/L (ref 98–111)
Creatinine, Ser: 1.13 mg/dL (ref 0.61–1.24)
GFR, Estimated: >60 mL/min (ref >60)
Glucose, Bld: 98 mg/dL (ref 70–99)
Potassium: 3.4 mmol/L — ABNORMAL LOW (ref 3.5–5.1)
Sodium: 139 mmol/L (ref 135–145)

## 2022-10-22 LAB — VAS US ABI WITH/WO TBI: Right ABI: 1.29

## 2022-10-22 SURGERY — AMPUTATION DIGIT
Anesthesia: General | Site: Second Toe | Laterality: Right

## 2022-10-22 MED ORDER — CEFAZOLIN SODIUM-DEXTROSE 2-4 GM/100ML-% IV SOLN
2.0000 g | INTRAVENOUS | Status: AC
  Administered 2022-10-22: 2 g via INTRAVENOUS

## 2022-10-22 MED ORDER — PHENOL 1.4 % MT LIQD: 1.0000 | OROMUCOSAL | Status: DC | PRN

## 2022-10-22 MED ORDER — ORAL CARE MOUTH RINSE: 15.0000 mL | Freq: Once | OROMUCOSAL | Status: AC

## 2022-10-22 MED ORDER — 0.9 % SODIUM CHLORIDE (POUR BTL) OPTIME
TOPICAL | Status: DC | PRN
  Administered 2022-10-22: 1000 mL

## 2022-10-22 MED ORDER — DOCUSATE SODIUM 100 MG PO CAPS
100.0000 mg | ORAL_CAPSULE | Freq: Every day | ORAL | Status: DC
  Filled 2022-10-22: qty 1

## 2022-10-22 MED ORDER — VITAMIN C 500 MG PO TABS
1000.0000 mg | ORAL_TABLET | Freq: Every day | ORAL | Status: DC
  Administered 2022-10-22 – 2022-10-23 (×2): 1000 mg via ORAL
  Filled 2022-10-22 (×2): qty 2

## 2022-10-22 MED ORDER — PROPOFOL 10 MG/ML IV BOLUS
INTRAVENOUS | Status: DC | PRN
  Administered 2022-10-22: 130 mg via INTRAVENOUS

## 2022-10-22 MED ORDER — POTASSIUM CHLORIDE CRYS ER 20 MEQ PO TBCR
40.0000 meq | EXTENDED_RELEASE_TABLET | Freq: Once | ORAL | Status: AC
  Administered 2022-10-22: 40 meq via ORAL
  Filled 2022-10-22: qty 2

## 2022-10-22 MED ORDER — SODIUM CHLORIDE 0.9 % IV SOLN: INTRAVENOUS | Status: DC

## 2022-10-22 MED ORDER — CHLORHEXIDINE GLUCONATE 0.12 % MT SOLN
OROMUCOSAL | Status: AC
  Administered 2022-10-22: 15 mL via OROMUCOSAL
  Filled 2022-10-22: qty 15

## 2022-10-22 MED ORDER — HYDROCODONE-ACETAMINOPHEN 5-325 MG PO TABS
1.0000 | ORAL_TABLET | ORAL | Status: DC | PRN
  Filled 2022-10-22: qty 2

## 2022-10-22 MED ORDER — EPHEDRINE SULFATE-NACL 50-0.9 MG/10ML-% IV SOSY
PREFILLED_SYRINGE | INTRAVENOUS | Status: DC | PRN
  Administered 2022-10-22: 5 mg via INTRAVENOUS

## 2022-10-22 MED ORDER — CHLORHEXIDINE GLUCONATE 0.12 % MT SOLN: 15.0000 mL | Freq: Once | OROMUCOSAL | Status: AC

## 2022-10-22 MED ORDER — PANTOPRAZOLE SODIUM 40 MG PO TBEC
40.0000 mg | DELAYED_RELEASE_TABLET | Freq: Every day | ORAL | Status: DC
  Administered 2022-10-22 – 2022-10-23 (×2): 40 mg via ORAL
  Filled 2022-10-22 (×2): qty 1

## 2022-10-22 MED ORDER — LIDOCAINE 2% (20 MG/ML) 5 ML SYRINGE
INTRAMUSCULAR | Status: AC
  Filled 2022-10-22: qty 5

## 2022-10-22 MED ORDER — MORPHINE SULFATE (PF) 2 MG/ML IV SOLN: 0.5000 mg | INTRAVENOUS | Status: DC | PRN

## 2022-10-22 MED ORDER — BISACODYL 5 MG PO TBEC: 5.0000 mg | DELAYED_RELEASE_TABLET | Freq: Every day | ORAL | Status: DC | PRN

## 2022-10-22 MED ORDER — HYDRALAZINE HCL 20 MG/ML IJ SOLN: 5.0000 mg | INTRAMUSCULAR | Status: DC | PRN

## 2022-10-22 MED ORDER — ACETAMINOPHEN 500 MG PO TABS
ORAL_TABLET | ORAL | Status: AC
  Administered 2022-10-22: 1000 mg via ORAL
  Filled 2022-10-22: qty 2

## 2022-10-22 MED ORDER — ACETAMINOPHEN 500 MG PO TABS: 1000.0000 mg | ORAL_TABLET | Freq: Once | ORAL | Status: AC

## 2022-10-22 MED ORDER — FENTANYL CITRATE (PF) 100 MCG/2ML IJ SOLN
INTRAMUSCULAR | Status: AC
  Filled 2022-10-22: qty 2

## 2022-10-22 MED ORDER — GUAIFENESIN-DM 100-10 MG/5ML PO SYRP: 15.0000 mL | ORAL_SOLUTION | ORAL | Status: DC | PRN

## 2022-10-22 MED ORDER — FENTANYL CITRATE (PF) 100 MCG/2ML IJ SOLN
25.0000 ug | INTRAMUSCULAR | Status: DC | PRN
  Administered 2022-10-22: 50 ug via INTRAVENOUS

## 2022-10-22 MED ORDER — MAGNESIUM CITRATE PO SOLN: 1.0000 | Freq: Once | ORAL | Status: DC | PRN

## 2022-10-22 MED ORDER — METOPROLOL TARTRATE 5 MG/5ML IV SOLN: 2.0000 mg | INTRAVENOUS | Status: DC | PRN

## 2022-10-22 MED ORDER — LACTATED RINGERS IV SOLN: INTRAVENOUS | Status: DC

## 2022-10-22 MED ORDER — FENTANYL CITRATE (PF) 250 MCG/5ML IJ SOLN
INTRAMUSCULAR | Status: AC
  Filled 2022-10-22: qty 5

## 2022-10-22 MED ORDER — ONDANSETRON HCL 4 MG/2ML IJ SOLN: 4.0000 mg | Freq: Four times a day (QID) | INTRAMUSCULAR | Status: DC | PRN

## 2022-10-22 MED ORDER — PROPOFOL 10 MG/ML IV BOLUS
INTRAVENOUS | Status: AC
  Filled 2022-10-22: qty 20

## 2022-10-22 MED ORDER — HYDROCODONE-ACETAMINOPHEN 7.5-325 MG PO TABS
1.0000 | ORAL_TABLET | ORAL | Status: DC | PRN
  Administered 2022-10-22: 2 via ORAL
  Administered 2022-10-22: 1 via ORAL
  Filled 2022-10-22: qty 1
  Filled 2022-10-22: qty 2

## 2022-10-22 MED ORDER — ONDANSETRON HCL 4 MG/2ML IJ SOLN
INTRAMUSCULAR | Status: DC | PRN
  Administered 2022-10-22: 4 mg via INTRAVENOUS

## 2022-10-22 MED ORDER — LOPERAMIDE HCL 2 MG PO CAPS
2.0000 mg | ORAL_CAPSULE | ORAL | Status: DC | PRN
  Administered 2022-10-22 – 2022-10-23 (×4): 2 mg via ORAL
  Filled 2022-10-22 (×4): qty 1

## 2022-10-22 MED ORDER — ALUM & MAG HYDROXIDE-SIMETH 200-200-20 MG/5ML PO SUSP: 15.0000 mL | ORAL | Status: DC | PRN

## 2022-10-22 MED ORDER — ZINC SULFATE 220 (50 ZN) MG PO CAPS
220.0000 mg | ORAL_CAPSULE | Freq: Every day | ORAL | Status: DC
  Administered 2022-10-22 – 2022-10-23 (×2): 220 mg via ORAL
  Filled 2022-10-22 (×2): qty 1

## 2022-10-22 MED ORDER — FENTANYL CITRATE (PF) 250 MCG/5ML IJ SOLN
INTRAMUSCULAR | Status: DC | PRN
  Administered 2022-10-22 (×2): 50 ug via INTRAVENOUS

## 2022-10-22 MED ORDER — ACETAMINOPHEN 325 MG PO TABS: 325.0000 mg | ORAL_TABLET | Freq: Four times a day (QID) | ORAL | Status: DC | PRN

## 2022-10-22 MED ORDER — POLYETHYLENE GLYCOL 3350 17 G PO PACK: 17.0000 g | PACK | Freq: Every day | ORAL | Status: DC | PRN

## 2022-10-22 MED ORDER — MAGNESIUM SULFATE 2 GM/50ML IV SOLN: 2.0000 g | Freq: Every day | INTRAVENOUS | Status: DC | PRN

## 2022-10-22 MED ORDER — POVIDONE-IODINE 10 % EX SWAB
2.0000 | Freq: Once | CUTANEOUS | Status: AC
  Administered 2022-10-22: 2 via TOPICAL

## 2022-10-22 MED ORDER — LABETALOL HCL 5 MG/ML IV SOLN: 10.0000 mg | INTRAVENOUS | Status: DC | PRN

## 2022-10-22 MED ORDER — LIDOCAINE 2% (20 MG/ML) 5 ML SYRINGE
INTRAMUSCULAR | Status: DC | PRN
  Administered 2022-10-22: 40 mg via INTRAVENOUS

## 2022-10-22 MED ORDER — POTASSIUM CHLORIDE CRYS ER 20 MEQ PO TBCR: 20.0000 meq | EXTENDED_RELEASE_TABLET | Freq: Every day | ORAL | Status: DC | PRN

## 2022-10-22 MED ORDER — CEFAZOLIN SODIUM-DEXTROSE 2-4 GM/100ML-% IV SOLN
INTRAVENOUS | Status: AC
  Filled 2022-10-22: qty 100

## 2022-10-22 MED ORDER — JUVEN PO PACK
1.0000 | PACK | Freq: Two times a day (BID) | ORAL | Status: DC
  Administered 2022-10-22 (×2): 1 via ORAL
  Filled 2022-10-22 (×3): qty 1

## 2022-10-22 MED ORDER — CHLORHEXIDINE GLUCONATE 4 % EX SOLN: 60.0000 mL | Freq: Once | CUTANEOUS | Status: DC

## 2022-10-22 SURGICAL SUPPLY — 31 items
BAG COUNTER SPONGE SURGICOUNT (BAG) ×1 IMPLANT
BLADE SURG 21 STRL SS (BLADE) ×1 IMPLANT
BNDG COHESIVE 4X5 TAN STRL (GAUZE/BANDAGES/DRESSINGS) ×1 IMPLANT
BNDG COHESIVE 4X5 TAN STRL LF (GAUZE/BANDAGES/DRESSINGS) IMPLANT
BNDG ESMARK 4X9 LF (GAUZE/BANDAGES/DRESSINGS) IMPLANT
BNDG GAUZE DERMACEA FLUFF 4 (GAUZE/BANDAGES/DRESSINGS) ×1 IMPLANT
COVER SURGICAL LIGHT HANDLE (MISCELLANEOUS) ×2 IMPLANT
DRAPE U-SHAPE 47X51 STRL (DRAPES) ×1 IMPLANT
DRSG ADAPTIC 3X8 NADH LF (GAUZE/BANDAGES/DRESSINGS) IMPLANT
DURAPREP 26ML APPLICATOR (WOUND CARE) ×1 IMPLANT
ELECT REM PT RETURN 9FT ADLT (ELECTROSURGICAL) ×1
ELECTRODE REM PT RTRN 9FT ADLT (ELECTROSURGICAL) ×1 IMPLANT
GAUZE PAD ABD 8X10 STRL (GAUZE/BANDAGES/DRESSINGS) ×1 IMPLANT
GAUZE SPONGE 4X4 12PLY STRL (GAUZE/BANDAGES/DRESSINGS) IMPLANT
GLOVE BIOGEL PI IND STRL 9 (GLOVE) ×1 IMPLANT
GLOVE SURG ORTHO 9.0 STRL STRW (GLOVE) ×1 IMPLANT
GOWN STRL REUS W/ TWL XL LVL3 (GOWN DISPOSABLE) ×2 IMPLANT
GOWN STRL REUS W/TWL XL LVL3 (GOWN DISPOSABLE) ×1
KIT BASIN OR (CUSTOM PROCEDURE TRAY) ×1 IMPLANT
KIT TURNOVER KIT B (KITS) ×1 IMPLANT
MANIFOLD NEPTUNE II (INSTRUMENTS) ×1 IMPLANT
NDL 22X1.5 STRL (OR ONLY) (MISCELLANEOUS) IMPLANT
NEEDLE 22X1.5 STRL (OR ONLY) (MISCELLANEOUS) IMPLANT
NS IRRIG 1000ML POUR BTL (IV SOLUTION) ×1 IMPLANT
PACK ORTHO EXTREMITY (CUSTOM PROCEDURE TRAY) ×1 IMPLANT
PAD ARMBOARD 7.5X6 YLW CONV (MISCELLANEOUS) ×2 IMPLANT
SUT ETHILON 2 0 PSLX (SUTURE) ×1 IMPLANT
SYR CONTROL 10ML LL (SYRINGE) IMPLANT
TOWEL GREEN STERILE (TOWEL DISPOSABLE) ×1 IMPLANT
TUBE CONNECTING 20X1/4 (TUBING) IMPLANT
YANKAUER SUCT BULB TIP NO VENT (SUCTIONS) IMPLANT

## 2022-10-22 NOTE — Anesthesia Preprocedure Evaluation (Addendum)
Anesthesia Evaluation  Patient identified by MRN, date of birth, ID band Patient awake    Reviewed: Allergy & Precautions, NPO status , Patient's Chart, lab work & pertinent test results  Airway Mallampati: I  TM Distance: >3 FB Neck ROM: Full    Dental  (+) Teeth Intact, Dental Advisory Given   Pulmonary former smoker   breath sounds clear to auscultation       Cardiovascular hypertension, + CAD   Rhythm:Regular Rate:Normal  Echo:  1. Left ventricular ejection fraction, by estimation, is 60 to 65%. The  left ventricle has normal function. The left ventricle has no regional  wall motion abnormalities. Left ventricular diastolic parameters are  consistent with Grade I diastolic  dysfunction (impaired relaxation). The average left ventricular global  longitudinal strain is -21.8 %. The global longitudinal strain is normal.   2. Right ventricular systolic function is normal. The right ventricular  size is normal. There is normal pulmonary artery systolic pressure. The  estimated right ventricular systolic pressure is 21.7 mmHg.   3. The mitral valve is normal in structure. Mild mitral valve  regurgitation. No evidence of mitral stenosis.   4. The aortic valve is abnormal. There is severe calcifcation of the  aortic valve. There is severe thickening of the aortic valve. Aortic valve  regurgitation is mild. Moderate aortic valve stenosis. Aortic valve area,  by VTI measures 0.95 cm. Aortic  valve mean gradient measures 24.0 mmHg. Aortic valve Vmax measures 3.36  m/s.   5. The inferior vena cava is normal in size with greater than 50%  respiratory variability, suggesting right atrial pressure of 3 mmHg.     Neuro/Psych  Neuromuscular disease  negative psych ROS   GI/Hepatic Neg liver ROS, hiatal hernia,GERD  ,,  Endo/Other  negative endocrine ROS    Renal/GU negative Renal ROS     Musculoskeletal  (+) Arthritis ,     Abdominal   Peds  Hematology negative hematology ROS (+)   Anesthesia Other Findings   Reproductive/Obstetrics                             Anesthesia Physical Anesthesia Plan  ASA: 3  Anesthesia Plan: General   Post-op Pain Management: Tylenol PO (pre-op)*   Induction: Intravenous  PONV Risk Score and Plan: 3 and Ondansetron and Treatment may vary due to age or medical condition  Airway Management Planned: LMA  Additional Equipment: None  Intra-op Plan:   Post-operative Plan: Extubation in OR  Informed Consent: I have reviewed the patients History and Physical, chart, labs and discussed the procedure including the risks, benefits and alternatives for the proposed anesthesia with the patient or authorized representative who has indicated his/her understanding and acceptance.     Dental advisory given  Plan Discussed with: CRNA  Anesthesia Plan Comments:        Anesthesia Quick Evaluation

## 2022-10-22 NOTE — Progress Notes (Signed)
[name] [name]  ZOX:[id number] DOB: 11-Apr-1936 DOA: 10/19/2022 PCP: Nelwyn Salisbury, MD    Brief Narrative:  87 year old with a history of CAD status post MI with DES, HTN, and prior MRSA infection who presented to the ER with swelling and redness of his right second toe x 4 days.  X-rays were obtained and suggested possible osteomyelitis of the area.  Consultants:  Dr. Lajoyce Corners  Goals of Care:  Code Status: Full Code   DVT prophylaxis: Subcutaneous heparin  Interim Hx: Taken to the OR today for right foot second toe amputation.  He has been afebrile with stable vitals since admission.  Assessment & Plan:  Osteomyelitis of right second toe Utilizing Vanc as well as broad coverage given history of MRSA infection -status post amputation right second toe at MTP joint 10/22/2022 with postoperative orthopedic recommendations as follows:  Antibiotic duration: Continue his antibiotics for 24 hours postoperatively. Weightbearing: Touchdown weightbearing on the right. Dressing care/ Wound VAC: Dry dressing reinforce as needed Ambulatory devices: Walker Discharge to: Anticipate discharge to home when safe with therapy. Follow-up: In the office 1 week post operative.  Hypokalemia Corrected with supplementation  CAD w/ MI s/p DES Continue his usual aspirin, statin, and ACE inhibitor - of note patient is intolerant to BB due to symptomatic bradycardia  Reported hx of MRSA  Review of chart notes no prior + cultures, and multiple remote MRSA nasal PCR screens that are all negative - cont Vanc for now, and d/c abx as soon as safe to do so  HTN Blood pressure well-controlled  Family Communication: No family present at time of exam today  Disposition: Anticipate discharge home 6/9   Objective: Blood pressure 115/70, pulse (!) 56, temperature 97.8 F (36.6 C), temperature source Oral, resp. rate 15, height 5' 10.98" (1.803 m), weight 60 kg, SpO2 97 %.  Intake/Output Summary (Last 24 hours)  at 10/22/2022 1649 Last data filed at 10/22/2022 8119 Gross per 24 hour  Intake 577.28 ml  Output --  Net 577.28 ml   Filed Weights   10/20/22 0500 10/20/22 1644 10/22/22 0828  Weight: 60 kg 60 kg 60 kg    Examination: General: No acute respiratory distress Lungs: Clear to auscultation bilaterally without wheezes or crackles Cardiovascular: Regular rate and rhythm - holosystolic M 2/6 Abdomen: Nontender, nondistended, soft, bowel sounds positive, no rebound, no ascites Extremities: Right foot dressed and dry with no significant edema of the left foot  CBC: Recent Labs  Lab 10/19/22 2024 10/20/22 0712 10/21/22 0815 10/22/22 0114  WBC 7.9 6.7 8.4 7.7  NEUTROABS 5.3  --   --   --   HGB 14.6 14.5 15.8 13.6  HCT 43.3 41.8 45.7 39.1  MCV 96.7 94.4 95.6 94.2  PLT 491* 542* 505* 460*   Basic Metabolic Panel: Recent Labs  Lab 10/20/22 0712 10/21/22 0815 10/22/22 0114  NA 134* 137 139  K 3.1* 3.6 3.4*  CL 101 103 102  CO2 24 23 25   GLUCOSE 105* 97 98  BUN 17 14 15   CREATININE 0.98 1.29* 1.13  CALCIUM 8.7* 9.2 8.7*   GFR: Estimated Creatinine Clearance: 40.6 mL/min (by C-G formula based on SCr of 1.13 mg/dL).   Scheduled Meds:  vitamin C  1,000 mg Oral Daily   aspirin EC  81 mg Oral Daily   cholecalciferol  1,000 Units Oral Daily   [START ON 10/23/2022] docusate sodium  100 mg Oral Daily   fentaNYL       heparin  5,000 Units Subcutaneous Q8H   multivitamin with minerals  1 tablet Oral Q breakfast   nutrition supplement (JUVEN)  1 packet Oral BID BM   pantoprazole  40 mg Oral Daily   rosuvastatin  20 mg Oral QODAY   saccharomyces boulardii  250 mg Oral Daily   zinc sulfate  220 mg Oral Daily   Continuous Infusions:  sodium chloride 75 mL/hr at 10/22/22 1114   cefTRIAXone (ROCEPHIN)  IV 2 g (10/22/22 1206)   And   metronidazole 500 mg (10/22/22 1124)   magnesium sulfate bolus IVPB     vancomycin 1,000 mg (10/21/22 2024)     LOS: 2 days   Lonia Blood,  MD [facility] Office  [phone] Pager - Text Page per Loretha Stapler  If 7PM-7AM, please contact night-coverage per Amion 10/22/2022, 4:49 PM

## 2022-10-22 NOTE — Anesthesia Postprocedure Evaluation (Signed)
Anesthesia Post Note  Patient: Dameer Speiser Fallaw  Procedure(s) Performed: AMPUTATION 2nd TOE (Right: Second Toe)     Patient location during evaluation: PACU Anesthesia Type: General Level of consciousness: awake and alert Pain management: pain level controlled Vital Signs Assessment: post-procedure vital signs reviewed and stable Respiratory status: spontaneous breathing, nonlabored ventilation, respiratory function stable and patient connected to nasal cannula oxygen Cardiovascular status: blood pressure returned to baseline and stable Postop Assessment: no apparent nausea or vomiting Anesthetic complications: no  No notable events documented.  Last Vitals:  Vitals:   10/22/22 1000 10/22/22 1133  BP: 118/63 112/67  Pulse: 64 60  Resp: 17 16  Temp: 36.5 C 36.4 C  SpO2: 94% 98%    Last Pain:  Vitals:   10/22/22 1205  TempSrc:   PainSc: 0-No pain                 Shelton Silvas

## 2022-10-22 NOTE — Anesthesia Procedure Notes (Signed)
Procedure Name: LMA Insertion Date/Time: 10/22/2022 8:46 AM  Performed by: Colon Flattery, CRNAPre-anesthesia Checklist: Patient identified, Emergency Drugs available, Suction available and Patient being monitored Patient Re-evaluated:Patient Re-evaluated prior to induction Oxygen Delivery Method: Circle system utilized Preoxygenation: Pre-oxygenation with 100% oxygen Induction Type: IV induction LMA: LMA inserted LMA Size: 4.0 Placement Confirmation: positive ETCO2 Dental Injury: Teeth and Oropharynx as per pre-operative assessment

## 2022-10-22 NOTE — Interval H&P Note (Signed)
History and Physical Interval Note:  10/22/2022 8:01 AM  [name] [name]  has presented today for surgery, with the diagnosis of Osteomyelitis.  The various methods of treatment have been discussed with the patient and family. After consideration of risks, benefits and other options for treatment, the patient has consented to  Procedure(s): AMPUTATION 2nd TOE (Right) as a surgical intervention.  The patient's history has been reviewed, patient examined, no change in status, stable for surgery.  I have reviewed the patient's chart and labs.  Questions were answered to the patient's satisfaction.     [name]

## 2022-10-22 NOTE — Op Note (Signed)
10/22/2022  9:41 AM  PATIENT:  [name] [name]    PRE-OPERATIVE DIAGNOSIS: Osteomyelitis right foot second toe  POST-OPERATIVE DIAGNOSIS:  Same  PROCEDURE:  AMPUTATION 2nd TOE at the MTP joint.  SURGEON:  [name], MD  PHYSICIAN ASSISTANT:None ANESTHESIA:   General  PREOPERATIVE INDICATIONS:  [name] [name] is a  87 y.o. male with a diagnosis of Osteomyelitis who failed conservative measures and elected for surgical management.    The risks benefits and alternatives were discussed with the patient preoperatively including but not limited to the risks of infection, bleeding, nerve injury, cardiopulmonary complications, the need for revision surgery, among others, and the patient was willing to proceed.  OPERATIVE IMPLANTS:   * No implants in log *  @ENCIMAGES @  OPERATIVE FINDINGS: Tissue margins clear good petechial bleeding.  OPERATIVE PROCEDURE: Patient was brought the operating room and underwent a general anesthetic.  After adequate levels anesthesia were obtained patient's right lower extremity was prepped using DuraPrep draped into a sterile field a timeout was called.  A extensile incision was made around the MTP joint of the right second toe.  The toe was amputated through the MTP joint.  Tissue margins were clear.  Electrocautery was used hemostasis.  The wound was irrigated with normal saline.  Incision closed using 2-0 nylon a sterile dressing was applied patient was extubated taken the PACU in stable condition.   DISCHARGE PLANNING:  Antibiotic duration: Continue his antibiotics for 24 hours postoperatively.  Weightbearing: Touchdown weightbearing on the right.  Pain medication: Low-dose opioid pathway  Dressing care/ Wound VAC: Dry dressing reinforce as needed  Ambulatory devices: Walker  Discharge to: Anticipate discharge to home when safe with therapy.  Follow-up: In the office 1 week post operative.

## 2022-10-22 NOTE — Transfer of Care (Signed)
Immediate Anesthesia Transfer of Care Note  Patient: [name] [name]  Procedure(s) Performed: AMPUTATION 2nd TOE (Right: Second Toe)  Patient Location: PACU  Anesthesia Type:General  Level of Consciousness: awake, alert , and oriented  Airway & Oxygen Therapy: Patient Spontanous Breathing  Post-op Assessment: Report given to RN, Post -op Vital signs reviewed and stable, and Patient moving all extremities  Post vital signs: Reviewed and stable  Last Vitals:  Vitals Value Taken Time  BP 131/68 10/22/22 0931  Temp 36.5 C 10/22/22 0930  Pulse 68 10/22/22 0933  Resp 15 10/22/22 0933  SpO2 96 % 10/22/22 0933  Vitals shown include unvalidated device data.  Last Pain:  Vitals:   10/22/22 0745  TempSrc:   PainSc: 0-No pain         Complications: No notable events documented.

## 2022-10-23 ENCOUNTER — Encounter (HOSPITAL_COMMUNITY): Payer: Self-pay | Admitting: Orthopedic Surgery

## 2022-10-23 ENCOUNTER — Other Ambulatory Visit: Payer: Self-pay

## 2022-10-23 DIAGNOSIS — M869 Osteomyelitis, unspecified: Secondary | ICD-10-CM | POA: Diagnosis not present

## 2022-10-23 MED ORDER — ACETAMINOPHEN 325 MG PO TABS: 325.0000 mg | ORAL_TABLET | Freq: Four times a day (QID) | ORAL | Status: AC | PRN

## 2022-10-23 MED ORDER — LOPERAMIDE HCL 2 MG PO CAPS: 2.0000 mg | ORAL_CAPSULE | ORAL | 0 refills | Status: AC | PRN

## 2022-10-23 MED ORDER — ENOXAPARIN SODIUM 40 MG/0.4ML IJ SOSY
40.0000 mg | PREFILLED_SYRINGE | INTRAMUSCULAR | Status: DC
  Administered 2022-10-23: 40 mg via SUBCUTANEOUS
  Filled 2022-10-23: qty 0.4

## 2022-10-23 NOTE — TOC Transition Note (Addendum)
Transition of Care (TOC) - CM/SW Discharge Note Donn Pierini RN, BSN Transitions of Care Unit 4E- RN Case Manager See Treatment Team for direct phone # Weekend cross coverage  Patient Details  Name: [name] [name] MRN: [id number] Date of Birth: 1936-05-13  Transition of Care [facility]) CM/SW Contact:  Darrold Span, RN Phone Number: 10/23/2022, 2:54 PM   Clinical Narrative:    Pt stable for transition home today, orders for [facility] and DME placed.   CM spoke with pt and wife at bedside, Choice offered for [facility] and DME needs. Per CMS guidelines from [employer] website with star ratings (copy placed in shadow chart)- pt/wife voiced that they have used [facility] in past Genevieve Norlander) and would like to use them again. No preference for DME provider.  Wife to transport home.   Address, phone #s, and PCP all confirmed.   Call made to [facility] for DME needs- RW and BSC to be delivered to room prior to discharge.   Call made to [facility] liaison- referral accepted. - Anticipate Tues 6/11 start of care  No further TOC needs noted.    Final next level of care: Home w Home Health Services Barriers to Discharge: No Barriers Identified   Patient Goals and CMS Choice CMS Medicare.gov Compare Post Acute Care list provided to:: Patient Choice offered to / list presented to : Patient, Spouse  Discharge Placement                   Home w/ [facility]      Discharge Plan and Services Additional resources added to the After Visit Summary for     Discharge Planning Services: CM Consult Post Acute Care Choice: Home Health, Durable Medical Equipment          DME Arranged: Bedside commode, Walker rolling DME Agency: [employer] Date DME Agency Contacted: 10/23/22 Time DME Agency Contacted: 1420 Representative spoke with at DME Agency: Vaughan Basta HH Arranged: PT HH Agency: [facility] Date [facility] Agency Contacted: 10/23/22 Time HH Agency Contacted: 1454 Representative  spoke with at [facility] Agency: Ladona Horns  Social Determinants of Health (SDOH) Interventions SDOH Screenings   Food Insecurity: No Food Insecurity (10/20/2022)  Housing: Low Risk  (10/20/2022)  Transportation Needs: No Transportation Needs (10/20/2022)  Utilities: Not At Risk (10/20/2022)  Depression (PHQ2-9): Low Risk  (09/20/2021)  Tobacco Use: Medium Risk (10/23/2022)     Readmission Risk Interventions    10/23/2022    2:54 PM  Readmission Risk Prevention Plan  Post Dischage Appt Complete  Medication Screening Complete  Transportation Screening Complete

## 2022-10-23 NOTE — Progress Notes (Signed)
Orthopedic Tech Progress Note Patient Details:  [name] 06-Oct-1935 [id number]  Ortho Devices Type of Ortho Device: Postop shoe/boot Ortho Device/Splint Location: RLE Ortho Device/Splint Interventions: Ordered, Application   Post Interventions Patient Tolerated: Well Instructions Provided: Care of device, Adjustment of device  Aquarius Tremper Carmine Savoy 10/23/2022, 11:58 AM

## 2022-10-23 NOTE — Discharge Instructions (Signed)
Keep dressing in place on foot, and keep it dry. If the wound loosens or becomes soaked, you may reinforce it as needed, but leave the original dressing in place.

## 2022-10-23 NOTE — Progress Notes (Signed)
Discharge instructions, medications, and follow-up appts reviewed with pt. prior to discharge--pt. voices understanding of all instructions given. PIV to LFA D/C'd and dry dressing placed. Catheter tip intact and hemostasis achieved within expected timeframe. All belongings sent with pt. at time of discharge. Pt. discharged home in stable condition via personal vehicle at this time.

## 2022-10-23 NOTE — Plan of Care (Signed)

## 2022-10-23 NOTE — Discharge Summary (Signed)
DISCHARGE SUMMARY  NAVARRO NINE  MR#: [id number]  DOB:[birth year]  Date of Admission: 10/19/2022 Date of Discharge: 10/23/2022  Attending Physician:Julieth Tugman Silvestre Gunner, MD  Patient's PCP:Fry, Tera Mater, MD  Consults: Dr. Lajoyce Corners   Disposition: D/C home w/ HHPT  Follow-up Appts:  Follow-up Information     [name], MD Follow up in 1 week(s).   Specialty: Orthopedic Surgery Contact information: [address] [place] [address] [phone]         [facility] Follow up.   Specialty: Home Health Services Why: HHPT arranged- they will contact you to schedule Contact information: [address] [place] [address] [phone]         [facility] Follow up.   Why: Rolling walker and bedside commode arranged- to be delivered to the room prior to discharge Contact information: 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 X-rays were obtained and suggested possible osteomyelitis of the toe.   Hospital Course:  Osteomyelitis of right second toe Utilized Vanc as well as broad coverage perioperatively given history of MRSA infection -status post amputation right second toe at MTP joint 10/22/2022 with postoperative orthopedic recommendations as follows:   Antibiotic duration: Continue his antibiotics for 24 hours postoperatively - completed  Weightbearing: Touchdown weightbearing on the right. Dressing care/ Wound VAC: Dry dressing reinforce as needed Ambulatory devices: Walker Discharge to: Anticipate discharge to home when safe with therapy. Follow-up: In the  office 1 week post operative.   Hypokalemia Corrected with supplementation   CAD w/ MI s/p DES Continue his usual aspirin, statin, and ACE inhibitor - of note patient is intolerant to BB due to symptomatic bradycardia   Reported hx of MRSA  Review of chart notes no prior + cultures, and multiple remote MRSA nasal PCR screens that are all negative    HTN Blood pressure well-controlled  Allergies as of 10/23/2022   No Known Allergies      Medication List     TAKE these medications    acetaminophen 325 MG tablet Commonly known as: TYLENOL Take 1-2 tablets (325-650 mg total) by mouth every 6 (six) hours as needed for mild pain (pain score 1-3 or temp > 100.5).   aspirin EC 81 MG tablet Take 81 mg by mouth daily. Swallow whole.   benazepril-hydrochlorthiazide 20-25 MG tablet Commonly known as: LOTENSIN HCT Take 1 tablet by mouth daily.   cholecalciferol 1000 units tablet Commonly known as: VITAMIN D Take 1,000 Units by mouth daily.   loperamide 2 MG capsule Commonly known as: IMODIUM Take 1 capsule (2 mg total) by mouth as needed for diarrhea or loose stools.   multivitamin with minerals Tabs tablet Take 1 tablet by mouth daily with breakfast.   nitroGLYCERIN 0.4 MG SL tablet Commonly known as: NITROSTAT Place 1 tablet (0.4 mg total) under the tongue every 5 (five) minutes as needed for chest pain (3 doses MAX).   PROBIOTIC PO Take 1 capsule by mouth daily with breakfast.   rosuvastatin 20 MG tablet Commonly known as:  CRESTOR TAKE 1 TABLET BY MOUTH EVERY OTHER DAY               Durable Medical Equipment  (From admission, onward)           Start     Ordered   10/23/22 1350  For home use only DME 3 n 1  Once        10/23/22 1349   10/23/22 1349  For home use only DME Walker rolling  Once       Question Answer Comment  Walker: With 5 Inch Wheels   Patient needs a walker to treat with the following condition Amputation, toe, traumatic, right, sequela  (HCC)      10/23/22 1349            Day of Discharge BP (!) 148/72 (BP Location: Right Arm)   Pulse 94   Temp 98 F (36.7 C) (Oral)   Resp 17   Ht 5' 10.98" (1.803 m)   Wt 60 kg   SpO2 98%   BMI 18.46 kg/m   Physical Exam: General: No acute respiratory distress Lungs: Clear to auscultation bilaterally without wheezes or crackles Cardiovascular: Regular rate and rhythm without murmur gallop or rub normal S1 and S2 Abdomen: Nontender, nondistended, soft, bowel sounds positive, no rebound, no ascites, no appreciable mass Extremities: No significant cyanosis, clubbing, or edema bilateral lower extremities  Basic Metabolic Panel: Recent Labs  Lab 10/19/22 2024 10/20/22 0405 10/20/22 0712 10/21/22 0815 10/22/22 0114  NA 138  --  134* 137 139  K 3.5  --  3.1* 3.6 3.4*  CL 102  --  101 103 102  CO2 25  --  24 23 25   GLUCOSE 94  --  105* 97 98  BUN 22  --  17 14 15   CREATININE 1.07 1.01 0.98 1.29* 1.13  CALCIUM 9.1  --  8.7* 9.2 8.7*   CBC: Recent Labs  Lab 10/19/22 2024 10/20/22 0712 10/21/22 0815 10/22/22 0114  WBC 7.9 6.7 8.4 7.7  NEUTROABS 5.3  --   --   --   HGB 14.6 14.5 15.8 13.6  HCT 43.3 41.8 45.7 39.1  MCV 96.7 94.4 95.6 94.2  PLT 491* 542* 505* 460*    Time spent in discharge (includes decision making & examination of pt): 30 minutes  10/23/2022, 4:49 PM   Lonia Blood, MD [facility] Office  [phone]

## 2022-10-23 NOTE — Plan of Care (Signed)
  Problem: Safety: Goal: Ability to remain free from injury will improve Outcome: Not Progressing   Problem: Activity: Goal: Ability to perform//tolerate increased activity and mobilize with assistive devices will improve Outcome: Not Progressing   Problem: Pain Management: Goal: Pain level will decrease with appropriate interventions Outcome: Not Progressing

## 2022-10-23 NOTE — Plan of Care (Signed)
Problem: Education: Goal: Knowledge of General Education information will improve Description: Including pain rating scale, medication(s)/side effects and non-pharmacologic comfort measures 10/23/2022 1513 by Weyman Pedro, RN Outcome: Adequate for Discharge 10/23/2022 1136 by Weyman Pedro, RN Outcome: Progressing   Problem: Health Behavior/Discharge Planning: Goal: Ability to manage health-related needs will improve 10/23/2022 1513 by Weyman Pedro, RN Outcome: Adequate for Discharge 10/23/2022 1136 by Weyman Pedro, RN Outcome: Progressing   Problem: Clinical Measurements: Goal: Ability to maintain clinical measurements within normal limits will improve 10/23/2022 1513 by Weyman Pedro, RN Outcome: Adequate for Discharge 10/23/2022 1136 by Weyman Pedro, RN Outcome: Progressing Goal: Will remain free from infection 10/23/2022 1513 by Weyman Pedro, RN Outcome: Adequate for Discharge 10/23/2022 1136 by Weyman Pedro, RN Outcome: Progressing Goal: Diagnostic test results will improve 10/23/2022 1513 by Weyman Pedro, RN Outcome: Adequate for Discharge 10/23/2022 1136 by Weyman Pedro, RN Outcome: Progressing Goal: Respiratory complications will improve 10/23/2022 1513 by Weyman Pedro, RN Outcome: Adequate for Discharge 10/23/2022 1136 by Weyman Pedro, RN Outcome: Progressing Goal: Cardiovascular complication will be avoided 10/23/2022 1513 by Weyman Pedro, RN Outcome: Adequate for Discharge 10/23/2022 1136 by Weyman Pedro, RN Outcome: Progressing   Problem: Activity: Goal: Risk for activity intolerance will decrease 10/23/2022 1513 by Weyman Pedro, RN Outcome: Adequate for Discharge 10/23/2022 1136 by Weyman Pedro, RN Outcome: Progressing   Problem: Nutrition: Goal: Adequate nutrition will be maintained 10/23/2022 1513 by Weyman Pedro, RN Outcome: Adequate for Discharge 10/23/2022 1136 by Weyman Pedro, RN Outcome: Progressing   Problem: Coping: Goal: Level of anxiety will decrease 10/23/2022 1513 by Weyman Pedro, RN Outcome: Adequate for Discharge 10/23/2022 1136 by Weyman Pedro, RN Outcome: Progressing   Problem: Elimination: Goal: Will not experience complications related to bowel motility 10/23/2022 1513 by Weyman Pedro, RN Outcome: Adequate for Discharge 10/23/2022 1136 by Weyman Pedro, RN Outcome: Progressing Goal: Will not experience complications related to urinary retention 10/23/2022 1513 by Weyman Pedro, RN Outcome: Adequate for Discharge 10/23/2022 1136 by Weyman Pedro, RN Outcome: Progressing   Problem: Pain Managment: Goal: General experience of comfort will improve 10/23/2022 1513 by Weyman Pedro, RN Outcome: Adequate for Discharge 10/23/2022 1136 by Weyman Pedro, RN Outcome: Progressing   Problem: Safety: Goal: Ability to remain free from injury will improve 10/23/2022 1513 by Weyman Pedro, RN Outcome: Adequate for Discharge 10/23/2022 1136 by Weyman Pedro, RN Outcome: Progressing   Problem: Skin Integrity: Goal: Risk for impaired skin integrity will decrease 10/23/2022 1513 by Weyman Pedro, RN Outcome: Adequate for Discharge 10/23/2022 1136 by Weyman Pedro, RN Outcome: Progressing   Problem: Education: Goal: Knowledge of the prescribed therapeutic regimen will improve 10/23/2022 1513 by Weyman Pedro, RN Outcome: Adequate for Discharge 10/23/2022 1136 by Weyman Pedro, RN Outcome: Progressing Goal: Ability to verbalize activity precautions or restrictions will improve 10/23/2022 1513 by Weyman Pedro, RN Outcome: Adequate for Discharge 10/23/2022 1136 by Weyman Pedro, RN Outcome: Progressing Goal: Understanding of discharge needs will improve 10/23/2022 1513 by Weyman Pedro, RN Outcome: Adequate for Discharge 10/23/2022 1136 by Weyman Pedro, RN Outcome: Progressing    Problem: Activity: Goal: Ability to perform//tolerate increased activity and mobilize with assistive devices will improve 10/23/2022 1513 by Weyman Pedro, RN Outcome: Adequate for Discharge 10/23/2022 1136 by Weyman Pedro, RN Outcome: Progressing   Problem: Clinical Measurements: Goal:  Postoperative complications will be avoided or minimized 10/23/2022 1513 by Weyman Pedro, RN Outcome: Adequate for Discharge 10/23/2022 1136 by Weyman Pedro, RN Outcome: Progressing   Problem: Self-Care: Goal: Ability to meet self-care needs will improve 10/23/2022 1513 by Weyman Pedro, RN Outcome: Adequate for Discharge 10/23/2022 1136 by Weyman Pedro, RN Outcome: Progressing   Problem: Self-Concept: Goal: Ability to maintain and perform role responsibilities to the fullest extent possible will improve 10/23/2022 1513 by Weyman Pedro, RN Outcome: Adequate for Discharge 10/23/2022 1136 by Weyman Pedro, RN Outcome: Progressing   Problem: Pain Management: Goal: Pain level will decrease with appropriate interventions 10/23/2022 1513 by Weyman Pedro, RN Outcome: Adequate for Discharge 10/23/2022 1136 by Weyman Pedro, RN Outcome: Progressing   Problem: Skin Integrity: Goal: Demonstration of wound healing without infection will improve 10/23/2022 1513 by Weyman Pedro, RN Outcome: Adequate for Discharge 10/23/2022 1136 by Weyman Pedro, RN Outcome: Progressing

## 2022-10-23 NOTE — Evaluation (Addendum)
Physical Therapy Evaluation and Discharge Patient Details Name: [name] [name] MRN: [id number] DOB: 1935-06-17 Today's Date: 10/23/2022  History of Present Illness  87 year old who presented to the ER with swelling and redness of his right second toe x 4 days.  X-rays were obtained and suggested possible osteomyelitis of the area. s/p  AMPUTATION 2nd TOE at the MTP joint; with a history of CAD status post MI with DES, HTN, RTKA, and prior MRSA infection  Clinical Impression  Patient evaluated by Physical Therapy with no further acute PT needs identified, as pt is to DC home today; All education has been completed and the patient has no further questions. Performed hallway amb with RW and use of a seat to prctice going up the step to get into the house; pt's wife present and supportive;  Agree with HHPT follow up; Questions solicited and answered; See below for any follow-up Physical Therapy or equipment needs. PT is signing off. Thank you for this referral.        Recommendations for follow up therapy are one component of a multi-disciplinary discharge planning process, led by the attending physician.  Recommendations may be updated based on patient status, additional functional criteria and insurance authorization.  Follow Up Recommendations       Assistance Recommended at Discharge Frequent or constant Supervision/Assistance  Patient can return home with the following  A little help with walking and/or transfers;A little help with bathing/dressing/bathroom;Assistance with cooking/housework;Help with stairs or ramp for entrance    Equipment Recommendations Rolling walker (2 wheels);BSC/3in1  Recommendations for Other Services       Functional Status Assessment Patient has had a recent decline in their functional status and demonstrates the ability to make significant improvements in function in a reasonable and predictable amount of time.     Precautions / Restrictions  Precautions Precautions: Fall Required Braces or Orthoses: Other Brace Other Brace: Postop shoe R Restrictions Weight Bearing Restrictions: Yes RLE Weight Bearing: Touchdown weight bearing      Mobility  Bed Mobility Overal bed mobility: Needs Assistance Bed Mobility: Supine to Sit     Supine to sit: Min guard     General bed mobility comments: Minguard for safety; no difficulty pushing up to sit    Transfers Overall transfer level: Needs assistance Equipment used: Rolling walker (2 wheels) Transfers: Sit to/from Stand Sit to Stand: Min assist           General transfer comment: Min assist, mostly for steadying RW; cues for optimal positioning of RLE    Ambulation/Gait Ambulation/Gait assistance: Min guard Gait Distance (Feet): 150 Feet Assistive device: Rolling walker (2 wheels) Gait Pattern/deviations: Step-through pattern, Step-to pattern       General Gait Details: Pt initially with step through pattern, and needing near constant cues for TWB; Once we switched to a step-to pattern, pt was more able to keep weight off of RLE; good fit of postop shoe, and better able to touch down on the heel  Stairs Stairs: Yes Stairs assistance: Min assist Stair Management:  (with a chair placed at the edge of the step) Number of Stairs: 1 General stair comments: Small step to enter home from the garage; pt very nervous about stepping up the step and questioning is he can do it; opted to place a chair (without armrests at teh step, and pt backed up to the chair and sat down; then he "walked" his feet around while still seated to face the other side, and stand up from  the chair  Wheelchair Mobility    Modified Rankin (Stroke Patients Only)       Balance                                             Pertinent Vitals/Pain Pain Assessment Pain Assessment: Faces Faces Pain Scale: Hurts a little bit Pain Location: R foot Pain Descriptors / Indicators:  Discomfort Pain Intervention(s): Monitored during session    Home Living Family/patient expects to be discharged to:: Private residence Living Arrangements: Spouse/significant other Available Help at Discharge: Family Type of Home: House Home Access: Stairs to enter Entrance Stairs-Rails: None Entrance Stairs-Number of Steps: 1   Home Layout: Two level;Able to live on main level with bedroom/bathroom Home Equipment: Rolling Walker (2 wheels);Cane - single point      Prior Function Prior Level of Function : Independent/Modified Independent             Mobility Comments: Typically walks without assitive device       Hand Dominance        Extremity/Trunk Assessment   Upper Extremity Assessment Upper Extremity Assessment: Overall WFL for tasks assessed    Lower Extremity Assessment Lower Extremity Assessment: RLE deficits/detail RLE Deficits / Details: Postop dressing intact, and foot and ankle wrapped; Hip and knee grossly WFL       Communication   Communication: No difficulties  Cognition Arousal/Alertness: Awake/alert Behavior During Therapy: WFL for tasks assessed/performed, Anxious Overall Cognitive Status: Within Functional Limits for tasks assessed                                 General Comments: Pt voiced worry that he wouldn't remember functional mobility instruction        General Comments General comments (skin integrity, edema, etc.): Wife present and helpful; pt expressed concern that he would not remember teaching from this session, and wife reassured him that she woul dhelp him    Exercises     Assessment/Plan    PT Assessment Patient needs continued PT services  PT Problem List Decreased strength;Decreased activity tolerance;Decreased balance;Decreased mobility;Decreased coordination;Decreased cognition;Decreased knowledge of use of DME;Decreased safety awareness;Decreased knowledge of precautions;Pain       PT Treatment  Interventions DME instruction;Gait training;Stair training;Functional mobility training;Therapeutic activities;Therapeutic exercise;Balance training;Cognitive remediation;Patient/family education    PT Goals (Current goals can be found in the Care Plan section)  Acute Rehab PT Goals Patient Stated Goal: Hopes to get home today PT Goal Formulation: All assessment and education complete, DC therapy    Frequency      Co-evaluation               AM-PAC PT "6 Clicks" Mobility  Outcome Measure Help needed turning from your back to your side while in a flat bed without using bedrails?: None Help needed moving from lying on your back to sitting on the side of a flat bed without using bedrails?: A Little Help needed moving to and from a bed to a chair (including a wheelchair)?: A Little Help needed standing up from a chair using your arms (e.g., wheelchair or bedside chair)?: A Little Help needed to walk in hospital room?: A Little Help needed climbing 3-5 steps with a railing? : A Lot 6 Click Score: 18    End of Session Equipment Utilized During  Treatment: Gait belt;Other (comment) (postop shoe) Activity Tolerance: Patient tolerated treatment well Patient left: in chair;with call bell/phone within reach;with family/visitor present Nurse Communication: Mobility status PT Visit Diagnosis: Unsteadiness on feet (R26.81);Other abnormalities of gait and mobility (R26.89)    Time: 1610-9604 PT Time Calculation (min) (ACUTE ONLY): 48 min   Charges:   PT Evaluation $PT Eval Moderate Complexity: 1 Mod PT Treatments $Gait Training: 23-37 mins        Van Clines, PT  Acute Rehabilitation Services Office [phone] Secure Chat welcomed   Levi Aland 10/23/2022, 3:44 PM

## 2022-10-24 NOTE — Progress Notes (Signed)
CARDIOLOGY OFFICE NOTE  Date:  11/07/2022    [name] [name] Date of Birth: 1936/02/03 Medical Record #[id number]  PCP:  Nelwyn Salisbury, MD  Cardiologist:  Francina Ames chief complaint on file.   History of Present Illness:  87 y.o. history of CAD with DES to RCA 2009. CRF;s HTN, HLD SSS with bradycardia beta blockers avoided  Myovue 09/30/16 old inferior MI no ischemia EF 56% low risk  Daughter works at [employer] and Rx Leukemia  2nd in charge  Two grand kids in town   Had uneventful right TKR with Dr Despina Hick on October 30 2017  Peritonsillar abscess August 2021   Echo 10/05/20  fairly stable peak velocity 75m/sec gradients 20/36 mmHg DVI 0.32 and AVA 1.1 cm2  Echo 10/15/21 :  mild/mod MR moderate AS mean gradients 26/47.6 mmHg DVI 0.27 AVA 0.85 cm2 Echo 04/28/22 mean gradient 30 peak 51 mmHg AVA 0.9 cm2 He is still asymptomatic  Saw primary Dr Clent Ridges 09/20/21 with some swollen lymph nodes in neck ? Seasonal allergies Rx with Flonase and Zyrtec  Given some progression discussed likely need for TAVR in next 2 years  He has no exertional symptoms  Sees dentist every 4 months and discussed importance of this if he is getting a valve in next year /2  Carotid 05/04/22 plaque no stenosis   TTE today with mean gradient increased from 24 mmHg to 29 and peal from 45 to 53 mmHg No symptoms  Had pinky toe on right foot amputated for infection Has seen dentist and had left sided tooth pulled   Past Medical History:  Diagnosis Date   Abscess of groin, left    Arthritis    R hip & knees   Cancer (HCC)    basal cell, history of    Closed fracture of right foot    a. 08/2014 Fx of R fifth metatarsal (Jones Fx).   Coronary artery disease    a. 11/2007 Cath/PCI: LM nl, LAD 40-8m, d1 40-50ost, LCX nl, RCA 90d (Platinum Study DES - promus), EF 60%.   Gastroesophageal reflux disease with hiatal hernia    H/O hiatal hernia    Hx MRSA infection    Hypertension    Pre-syncope    a.  08/2014 w/ sinus bradycardia in 40's in ED.    Past Surgical History:  Procedure Laterality Date   AMPUTATION Right 10/22/2022   Procedure: AMPUTATION 2nd TOE;  Surgeon: [name], MD;  Location: [facility];  Service: Orthopedics;  Laterality: Right;   APPENDECTOMY     CARDIAC CATHETERIZATION  12/06/07   60%   COLONOSCOPY  06/08/2015   per Dr. Ewing Schlein, no polyps    CORONARY ANGIOPLASTY WITH STENT PLACEMENT  11/2007   HERNIA REPAIR     KNEE ARTHROSCOPY Right 10/03/2012   Procedure: RIGHT KNEEE ARTHROSCOPY WITH DEBRIDEMENT ;  Surgeon: Loanne Drilling, MD;  Location: [facility] ORS;  Service: Orthopedics;  Laterality: Right;   SHOULDER SURGERY  05/2016   rotator cuff and ligament repair    TOTAL HIP ARTHROPLASTY Right 04/05/2013   Procedure: RIGHT TOTAL HIP ARTHROPLASTY ANTERIOR APPROACH;  Surgeon: Loanne Drilling, MD;  Location: [facility] OR;  Service: Orthopedics;  Laterality: Right;   TOTAL KNEE ARTHROPLASTY Right 10/30/2017   Procedure: RIGHT TOTAL KNEE ARTHROPLASTY;  Surgeon: Ollen Gross, MD;  Location: [facility] ORS;  Service: Orthopedics;  Laterality: Right;   YAG LASER APPLICATION     for glaucoma - both eyes, no  drops as of yet      Medications: Current Outpatient Medications  Medication Sig Dispense Refill   acetaminophen (TYLENOL) 325 MG tablet Take 1-2 tablets (325-650 mg total) by mouth every 6 (six) hours as needed for mild pain (pain score 1-3 or temp > 100.5).     aspirin 81 MG EC tablet Take 81 mg by mouth daily. Swallow whole.     benazepril-hydrochlorthiazide (LOTENSIN HCT) 20-25 MG tablet Take 1 tablet by mouth daily. 30 tablet 0   cholecalciferol (VITAMIN D) 1000 UNITS tablet Take 1,000 Units by mouth daily.      loperamide (IMODIUM) 2 MG capsule Take 1 capsule (2 mg total) by mouth as needed for diarrhea or loose stools. 30 capsule 0   Multiple Vitamin (MULTIVITAMIN WITH MINERALS) TABS tablet Take 1 tablet by mouth daily with breakfast.     nitroGLYCERIN (NITROSTAT) 0.4 MG SL tablet Place  1 tablet (0.4 mg total) under the tongue every 5 (five) minutes as needed for chest pain (3 doses MAX). 25 tablet 3   Probiotic Product (PROBIOTIC PO) Take 1 capsule by mouth daily with breakfast.     rosuvastatin (CRESTOR) 20 MG tablet TAKE 1 TABLET BY MOUTH EVERY OTHER DAY 45 tablet 3   No current facility-administered medications for this visit.    Allergies: No Known Allergies  Social History: The patient  reports that he quit smoking about 61 years ago. His smoking use included cigarettes. He has never used smokeless tobacco. He reports that he does not currently use alcohol. He reports that he does not use drugs.   Family History: The patient's family history includes Heart disease in an other family member; Hypertension in his brother, sister, and another family member; Stomach cancer in his father.   Review of Systems: Please see the history of present illness.   Otherwise, the review of systems is positive for none.   All other systems are reviewed and negative.   Physical Exam: VS:  BP 138/62   Pulse (!) 56   Ht 5\' 11"  (1.803 m)   Wt 135 lb (61.2 kg)   SpO2 99%   BMI 18.83 kg/m  .  BMI Body mass index is 18.83 kg/m.  Wt Readings from Last 3 Encounters:  11/07/22 135 lb (61.2 kg)  10/22/22 132 lb 4.4 oz (60 kg)  04/28/22 142 lb 3.2 oz (64.5 kg)    Affect appropriate Healthy:  appears stated age HEENT: normal Neck supple with no adenopathy JVP normal no bruits no thyromegaly Lungs clear with no wheezing and good diaphragmatic motion Heart:  S1/S2 mild  AS  murmur, no rub, gallop or click PMI normal Abdomen: benighn, BS positve, no tenderness, no AAA no bruit.  No HSM or HJR Distal pulses intact with no bruits No edema Neuro non-focal Skin warm and dry Post right TKR    LABORATORY DATA:  EKG:   10/04/19 NSR normal ECG 11/07/2022 SR rate 52 normal  11/07/2022 SR rate 72 PR 254 msec otherwise normal   Lab Results  Component Value Date   WBC 7.7 10/22/2022    HGB 13.6 10/22/2022   HCT 39.1 10/22/2022   PLT 460 (H) 10/22/2022   GLUCOSE 98 10/22/2022   CHOL 149 01/09/2019   TRIG 110.0 01/09/2019   HDL 70.10 01/09/2019   LDLCALC 57 01/09/2019   ALT 17 10/19/2022   AST 19 10/19/2022   NA 139 10/22/2022   K 3.4 (L) 10/22/2022   CL 102 10/22/2022   CREATININE  1.13 10/22/2022   BUN 15 10/22/2022   CO2 25 10/22/2022   TSH 1.83 01/09/2019   PSA 1.78 01/09/2019   INR 1.00 10/19/2017   HGBA1C  12/07/2007    5.5 (NOTE)   The ADA recommends the following therapeutic goal for glycemic   control related to Hgb A1C measurement:   Goal of Therapy:   < 7.0% Hgb A1C   Reference: American Diabetes Association: Clinical Practice   Recommendations 2008, Diabetes Care,  2008, 31:(Suppl 1).    BNP (last 3 results) No results for input(s): "BNP" in the last 8760 hours.  ProBNP (last 3 results) No results for input(s): "PROBNP" in the last 8760 hours.   Other Studies Reviewed Today:  Echo  11/07/22  Mean gradient 29 peak 53 mmHg    LHC 11/2007 LM: Normal LAD: Proximal to mid 40-50%, ostial D1 40-50% LCx: Normal RCA: Distal 90% EF 60% PCI: DES to the RCA    Assessment/Plan:  CAD:  Stable no angina medical Rx . Non ischemic myovue May 2018 no complications with general anesthesia and knee surgery June 2019    Benign essential HTN:  Well controlled.  Continue current medications and low sodium Dash type diet.     Hyperlipidemia:  Continue statin labs with primary Historically LDL <60   Bradycardia:  History of AV block and wenckebach avoid beta blocker no high Grade AV block or indication for PPM  Murmur:  AS echo 04/28/22 mean gradient 24 peak 45.3 mmHg DVI 0.37 and AVA 0.95 cm2    will likely be considered for TAVR In 2 years Update TTE December  Bruit:  Right side ASA  [place] 05/04/22 plaque no stenosis   Ortho:  Post right TKR 10/30/17 Dr Despina Hick improved    TTE 04/2023 for AS   Disposition:   f/u in a year    Charlton Haws

## 2022-10-25 ENCOUNTER — Telehealth: Payer: Self-pay

## 2022-10-25 LAB — CULTURE, BLOOD (ROUTINE X 2)
Culture: NO GROWTH
Culture: NO GROWTH
Special Requests: ADEQUATE
Special Requests: ADEQUATE

## 2022-10-25 NOTE — Transitions of Care (Post Inpatient/ED Visit) (Signed)
10/25/2022  Name: [name] [name] MRN: [id number] DOB: 11-Mar-1936  Today's TOC FU Call Status: Today's TOC FU Call Status:: Successful TOC FU Call Competed TOC FU Call Complete Date: 10/25/22  Transition Care Management Follow-up Telephone Call Date of Discharge: 10/23/22 Discharge Facility: [facility]) Type of Discharge: Inpatient Admission Primary Inpatient Discharge Diagnosis:: "osteomyelitis,second toe of right foot" How have you been since you were released from the hospital?: Better (Spoke w/ spouse-states pt doing well. He has had no pain to surgical site.-up walking w/ walker. Drsg is clean,dry & intact. Appetite good. Diarrhea from abxs in hospital has resolved-normal BM yest.) Any questions or concerns?: No  Items Reviewed: Did you receive and understand the discharge instructions provided?: Yes Medications obtained,verified, and reconciled?: Yes (Medications Reviewed) Any new allergies since your discharge?: No Dietary orders reviewed?: Yes Type of Diet Ordered:: low salt/heart healthy Do you have support at home?: Yes People in Home: spouse Name of Support/Comfort Primary Source: Marylene Land  Medications Reviewed Today: Medications Reviewed Today     Reviewed by Charlyn Minerva, RN (Registered Nurse) on 10/25/22 at 1016  Med List Status: <None>   Medication Order Taking? Sig Documenting Provider Last Dose Status Informant  acetaminophen (TYLENOL) 325 MG tablet [id number] Yes Take 1-2 tablets (325-650 mg total) by mouth every 6 (six) hours as needed for mild pain (pain score 1-3 or temp > 100.5). Lonia Blood, MD Taking Active   aspirin 81 MG EC tablet [id number] Yes Take 81 mg by mouth daily. Swallow whole. [provider] Taking Active Self  benazepril-hydrochlorthiazide (LOTENSIN HCT) 20-25 MG tablet [id number] Yes Take 1 tablet by mouth daily. Wendall Stade, MD Taking Active Self  cholecalciferol (VITAMIN D) 1000 UNITS tablet [id number] Yes  Take 1,000 Units by mouth daily.  [provider] Taking Active Self           Med Note Excell Seltzer, NATASHA   Wed Oct 03, 2018  9:32 AM)    loperamide (IMODIUM) 2 MG capsule [id number] Yes Take 1 capsule (2 mg total) by mouth as needed for diarrhea or loose stools. Lonia Blood, MD Taking Active   Multiple Vitamin (MULTIVITAMIN WITH MINERALS) TABS tablet [id number] Yes Take 1 tablet by mouth daily with breakfast. [provider] Taking Active Self           Med Note Excell Seltzer, NATASHA   Wed Oct 03, 2018  9:32 AM)    nitroGLYCERIN (NITROSTAT) 0.4 MG SL tablet [id number] Yes Place 1 tablet (0.4 mg total) under the tongue every 5 (five) minutes as needed for chest pain (3 doses MAX). Wendall Stade, MD Taking Active Self  Probiotic Product (PROBIOTIC PO) [id number] Yes Take 1 capsule by mouth daily with breakfast. [provider] Taking Active Self           Med Note Excell Seltzer, NATASHA   Wed Oct 03, 2018  9:32 AM)    rosuvastatin (CRESTOR) 20 MG tablet [id number] Yes TAKE 1 TABLET BY MOUTH EVERY OTHER DAY Wendall Stade, MD Taking Active Self            Home Care and Equipment/Supplies: Were Home Health Services Ordered?: Yes Name of Home Health Agency:: [facility] Has Agency set up a time to come to your home?: No (per inpt TOC note-start of care date today-10/25/22-provided spouse with agency contact info to call/follow up if she does not hear from them) Any new equipment or medical supplies ordered?: Yes Name of Medical supply agency?: [facility]  Were you able to get the equipment/medical supplies?: Yes Do you have any questions related to the use of the equipment/supplies?: No  Functional Questionnaire: Do you need assistance with bathing/showering or dressing?: No Do you need assistance with meal preparation?: No Do you need assistance with eating?: No Do you have difficulty maintaining continence: No Do you have difficulty managing or taking your  medications?: No  Follow up appointments reviewed: PCP Follow-up appointment confirmed?: NA (Spouse declined wanting to make PCP follow up appt) Specialist Hospital Follow-up appointment confirmed?: Yes Date of Specialist follow-up appointment?: 10/31/22 Follow-Up Specialty Provider:: Dr. Lajoyce Corners Do you need transportation to your follow-up appointment?: No Do you understand care options if your condition(s) worsen?: Yes-patient verbalized understanding  SDOH Interventions Today    Flowsheet Row Most Recent Value  SDOH Interventions   Food Insecurity Interventions Intervention Not Indicated  Transportation Interventions Intervention Not Indicated      TOC Interventions Today    Flowsheet Row Most Recent Value  TOC Interventions   TOC Interventions Discussed/Reviewed TOC Interventions Discussed      Interventions Today    Flowsheet Row Most Recent Value  General Interventions   General Interventions Discussed/Reviewed General Interventions Discussed, Doctor Visits  Doctor Visits Discussed/Reviewed Doctor Visits Discussed, Specialist, PCP  PCP/Specialist Visits Compliance with follow-up visit  Education Interventions   Education Provided Provided Education  Provided Verbal Education On Nutrition, When to see the doctor, Other  [pain mgmt/bowel regimen]  Nutrition Interventions   Nutrition Discussed/Reviewed Nutrition Discussed, Adding fruits and vegetables, Decreasing salt  Pharmacy Interventions   Pharmacy Dicussed/Reviewed Pharmacy Topics Discussed, Medications and their functions  Safety Interventions   Safety Discussed/Reviewed Safety Discussed, Home Safety, Fall Risk  Home Safety Assistive Devices       [place], [place] [facility] Care Management Care Management Community Coordinator Direct Phone: [phone] Toll Free: [phone] Fax: [phone]

## 2022-10-27 DIAGNOSIS — K219 Gastro-esophageal reflux disease without esophagitis: Secondary | ICD-10-CM | POA: Diagnosis not present

## 2022-10-27 DIAGNOSIS — M1712 Unilateral primary osteoarthritis, left knee: Secondary | ICD-10-CM | POA: Diagnosis not present

## 2022-10-27 DIAGNOSIS — Z89421 Acquired absence of other right toe(s): Secondary | ICD-10-CM | POA: Diagnosis not present

## 2022-10-27 DIAGNOSIS — I2510 Atherosclerotic heart disease of native coronary artery without angina pectoris: Secondary | ICD-10-CM | POA: Diagnosis not present

## 2022-10-27 DIAGNOSIS — I10 Essential (primary) hypertension: Secondary | ICD-10-CM | POA: Diagnosis not present

## 2022-10-27 DIAGNOSIS — Z4781 Encounter for orthopedic aftercare following surgical amputation: Secondary | ICD-10-CM | POA: Diagnosis not present

## 2022-10-27 DIAGNOSIS — Z96641 Presence of right artificial hip joint: Secondary | ICD-10-CM | POA: Diagnosis not present

## 2022-10-27 DIAGNOSIS — Z7982 Long term (current) use of aspirin: Secondary | ICD-10-CM | POA: Diagnosis not present

## 2022-10-27 DIAGNOSIS — Z87891 Personal history of nicotine dependence: Secondary | ICD-10-CM | POA: Diagnosis not present

## 2022-10-27 DIAGNOSIS — Z96652 Presence of left artificial knee joint: Secondary | ICD-10-CM | POA: Diagnosis not present

## 2022-10-27 DIAGNOSIS — Z955 Presence of coronary angioplasty implant and graft: Secondary | ICD-10-CM | POA: Diagnosis not present

## 2022-10-27 DIAGNOSIS — Z85828 Personal history of other malignant neoplasm of skin: Secondary | ICD-10-CM | POA: Diagnosis not present

## 2022-10-27 DIAGNOSIS — Z8781 Personal history of (healed) traumatic fracture: Secondary | ICD-10-CM | POA: Diagnosis not present

## 2022-10-27 DIAGNOSIS — I252 Old myocardial infarction: Secondary | ICD-10-CM | POA: Diagnosis not present

## 2022-10-31 ENCOUNTER — Ambulatory Visit (INDEPENDENT_AMBULATORY_CARE_PROVIDER_SITE_OTHER): Payer: Medicare Other | Admitting: Orthopedic Surgery

## 2022-10-31 ENCOUNTER — Encounter: Payer: Self-pay | Admitting: Orthopedic Surgery

## 2022-10-31 DIAGNOSIS — Z89421 Acquired absence of other right toe(s): Secondary | ICD-10-CM

## 2022-10-31 NOTE — Progress Notes (Signed)
Office Visit Note   Patient: [name] [name]           Date of Birth: April 01, 1936           MRN: [id number] Visit Date: 10/31/2022              Requested by: Nelwyn Salisbury, MD [address] [place],  [place] [address] PCP: Nelwyn Salisbury, MD  Chief Complaint  Patient presents with   Right Foot - Routine Post Op    10/22/22 right second toe amputation      HPI: Patient is an 87 year old gentleman who presents 1 week status post right foot second ray amputation.  Assessment & Plan: Visit Diagnoses:  1. 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  Follow-Up Instructions: Return in about 1 week (around 11/07/2022).   Ortho Exam  Patient is alert, oriented, no adenopathy, well-dressed, normal affect, normal respiratory effort. Examination the incision is well-approximated there is a small area of hypergranulation tissue this was touched with silver nitrate.  There is no cellulitis no drainage.  Imaging: No results found.   Labs: Lab Results  Component Value Date   HGBA1C  12/07/2007    5.5 (NOTE)   The ADA recommends the following therapeutic goal for glycemic   control related to Hgb A1C measurement:   Goal of Therapy:   < 7.0% Hgb A1C   Reference: American Diabetes Association: Clinical Practice   Recommendations 2008, Diabetes Care,  2008, 31:(Suppl 1).   ESRSEDRATE 7 10/20/2022   CRP <0.5 10/20/2022   REPTSTATUS 10/25/2022 FINAL 10/20/2022   CULT  10/20/2022    NO GROWTH 5 DAYS Performed at [facility], [address], [place], [place] [address]      Lab Results  Component Value Date   ALBUMIN 4.1 10/19/2022   ALBUMIN 4.8 01/09/2019   ALBUMIN 4.6 10/19/2017    Lab Results  Component Value Date   MG 2.1 01/12/2016   No results found for: "VD25OH"  No results found for: "PREALBUMIN"    Latest Ref Rng & Units 10/22/2022    1:14  AM 10/21/2022    8:15 AM 10/20/2022    7:12 AM  CBC EXTENDED  WBC 4.0 - 10.5 K/uL 7.7  8.4  6.7   RBC 4.22 - 5.81 MIL/uL 4.15  4.78  4.43   Hemoglobin 13.0 - 17.0 g/dL 86.5  78.4  69.6   HCT 39.0 - 52.0 % 39.1  45.7  41.8   Platelets 150 - 400 K/uL 460  505  542      There is no height or weight on file to calculate BMI.  Orders:  No orders of the defined types were placed in this encounter.  No orders of the defined types were placed in this encounter.    Procedures: No procedures performed  Clinical Data: No additional findings.  ROS:  All other systems negative, except as noted in the HPI. Review of Systems  Objective: Vital Signs: There were no vitals taken for this visit.  Specialty Comments:  No specialty comments available.  PMFS History: Patient Active Problem List   Diagnosis Date Noted   Osteomyelitis of second toe of right foot (HCC) 10/20/2022   OA (osteoarthritis) of knee 10/30/2017   Aortic valve stenosis 07/18/2017   Chronic pain of right knee 07/18/2017   Osteoarthritis of knee 05/31/2017  Abscess of left groin 05/07/2016   Pre-syncope    Coronary artery disease    OA (osteoarthritis) of hip 04/05/2013   Lateral meniscal tear 10/03/2012   CONTACT DERMATITIS 12/12/2008   APHTHOUS ULCERS 09/22/2008   BOILS, RECURRENT 02/18/2008   Coronary atherosclerosis 12/10/2007   Elevated lipids 02/28/2007   Essential hypertension 02/28/2007   Past Medical History:  Diagnosis Date   Abscess of groin, left    Arthritis    R hip & knees   Cancer (HCC)    basal cell, history of    Closed fracture of right foot    a. 08/2014 Fx of R fifth metatarsal (Jones Fx).   Coronary artery disease    a. 11/2007 Cath/PCI: LM nl, LAD 40-11m, d1 40-50ost, LCX nl, RCA 90d (Platinum Study DES - promus), EF 60%.   Gastroesophageal reflux disease with hiatal hernia    H/O hiatal hernia    Hx MRSA infection    Hypertension    Pre-syncope    a. 08/2014 w/ sinus bradycardia  in 40's in ED.    Family History  Problem Relation Age of Onset   Hypertension Sister    Hypertension Brother    Stomach cancer Father        family hx   Hypertension Other        family hx   Heart disease Other        family hx   Heart attack Neg Hx    Stroke Neg Hx     Past Surgical History:  Procedure Laterality Date   AMPUTATION Right 10/22/2022   Procedure: AMPUTATION 2nd TOE;  Surgeon: [name], MD;  Location: [facility] OR;  Service: Orthopedics;  Laterality: Right;   APPENDECTOMY     CARDIAC CATHETERIZATION  12/06/07   60%   COLONOSCOPY  06/08/2015   per Dr. Ewing Schlein, no polyps    CORONARY ANGIOPLASTY WITH STENT PLACEMENT  11/2007   HERNIA REPAIR     KNEE ARTHROSCOPY Right 10/03/2012   Procedure: RIGHT KNEEE ARTHROSCOPY WITH DEBRIDEMENT ;  Surgeon: Loanne Drilling, MD;  Location: [facility];  Service: Orthopedics;  Laterality: Right;   SHOULDER SURGERY  05/2016   rotator cuff and ligament repair    TOTAL HIP ARTHROPLASTY Right 04/05/2013   Procedure: RIGHT TOTAL HIP ARTHROPLASTY ANTERIOR APPROACH;  Surgeon: Loanne Drilling, MD;  Location: [facility] OR;  Service: Orthopedics;  Laterality: Right;   TOTAL KNEE ARTHROPLASTY Right 10/30/2017   Procedure: RIGHT TOTAL KNEE ARTHROPLASTY;  Surgeon: Ollen Gross, MD;  Location: [facility];  Service: Orthopedics;  Laterality: Right;   YAG LASER APPLICATION     for glaucoma - both eyes, no drops as of yet    Social History   Occupational History   Not on file  Tobacco Use   Smoking status: Former    Types: Cigarettes    Quit date: 09/28/1961    Years since quitting: 61.1   Smokeless tobacco: Never  Vaping Use   Vaping Use: Never used  Substance and Sexual Activity   Alcohol use: Not Currently    Comment: occ   Drug use: No   Sexual activity: Not on file

## 2022-11-02 ENCOUNTER — Telehealth: Payer: Self-pay | Admitting: Family Medicine

## 2022-11-02 NOTE — Telephone Encounter (Signed)
[facility] is calling and need VO for PT 1x1, 2x2, 1x4

## 2022-11-03 NOTE — Telephone Encounter (Signed)
Please okay this order  ?

## 2022-11-03 NOTE — Telephone Encounter (Signed)
Spoke with Denny Peon advised the VO orders requested were approved by Dr Clent Ridges

## 2022-11-07 ENCOUNTER — Encounter: Payer: Medicare Other | Admitting: Orthopedic Surgery

## 2022-11-07 ENCOUNTER — Encounter: Payer: Self-pay | Admitting: Cardiovascular Disease

## 2022-11-07 ENCOUNTER — Ambulatory Visit (HOSPITAL_BASED_OUTPATIENT_CLINIC_OR_DEPARTMENT_OTHER): Payer: Medicare Other

## 2022-11-07 ENCOUNTER — Ambulatory Visit (HOSPITAL_COMMUNITY): Payer: Medicare Other | Attending: Cardiovascular Disease | Admitting: Cardiovascular Disease

## 2022-11-07 VITALS — BP 138/62 | HR 56 | Ht 71.0 in | Wt 135.0 lb

## 2022-11-07 DIAGNOSIS — I350 Nonrheumatic aortic (valve) stenosis: Secondary | ICD-10-CM | POA: Diagnosis not present

## 2022-11-07 DIAGNOSIS — I2510 Atherosclerotic heart disease of native coronary artery without angina pectoris: Secondary | ICD-10-CM | POA: Diagnosis not present

## 2022-11-07 LAB — ECHOCARDIOGRAM COMPLETE
AR max vel: 0.83 cm2
AV Area VTI: 0.77 cm2
AV Area mean vel: 0.77 cm2
AV Mean grad: 28 mmHg
AV Peak grad: 51.3 mmHg
Ao pk vel: 3.58 m/s
Area-P 1/2: 1.97 cm2
S' Lateral: 2.6 cm

## 2022-11-07 NOTE — Patient Instructions (Addendum)
Medication Instructions:  Your physician recommends that you continue on your current medications as directed. Please refer to the Current Medication list given to you today.  *If you need a refill on your cardiac medications before your next appointment, please call your pharmacy*  Lab Work: If you have labs (blood work) drawn today and your tests are completely normal, you will receive your results only by: MyChart Message (if you have MyChart) OR A paper copy in the mail If you have any lab test that is abnormal or we need to change your treatment, we will call you to review the results.  Testing/Procedures: Your physician has requested that you have an echocardiogram in December. Echocardiography is a painless test that uses sound waves to create images of your heart. It provides your doctor with information about the size and shape of your heart and how well your heart's chambers and valves are working. This procedure takes approximately one hour. There are no restrictions for this procedure. Please do NOT wear cologne, perfume, aftershave, or lotions (deodorant is allowed). Please arrive 15 minutes prior to your appointment time.  Follow-Up: At [facility], you and your health needs are our priority.  As part of our continuing mission to provide you with exceptional heart care, we have created designated Provider Care Teams.  These Care Teams include your primary Cardiologist (physician) and Advanced Practice Providers (APPs -  Physician Assistants and Nurse Practitioners) who all work together to provide you with the care you need, when you need it.  We recommend signing up for the patient portal called "MyChart".  Sign up information is provided on this After Visit Summary.  MyChart is used to connect with patients for Virtual Visits (Telemedicine).  Patients are able to view lab/test results, encounter notes, upcoming appointments, etc.  Non-urgent messages can be sent to your  provider as well.   To learn more about what you can do with MyChart, go to [email or web address].    Your next appointment:   6 months  Provider:   Charlton Haws, MD

## 2022-11-08 ENCOUNTER — Ambulatory Visit (INDEPENDENT_AMBULATORY_CARE_PROVIDER_SITE_OTHER): Payer: Medicare Other | Admitting: Orthopedic Surgery

## 2022-11-08 ENCOUNTER — Encounter: Payer: Self-pay | Admitting: Orthopedic Surgery

## 2022-11-08 DIAGNOSIS — M1712 Unilateral primary osteoarthritis, left knee: Secondary | ICD-10-CM | POA: Diagnosis not present

## 2022-11-08 DIAGNOSIS — K219 Gastro-esophageal reflux disease without esophagitis: Secondary | ICD-10-CM | POA: Diagnosis not present

## 2022-11-08 DIAGNOSIS — I252 Old myocardial infarction: Secondary | ICD-10-CM | POA: Diagnosis not present

## 2022-11-08 DIAGNOSIS — Z89421 Acquired absence of other right toe(s): Secondary | ICD-10-CM

## 2022-11-08 DIAGNOSIS — I10 Essential (primary) hypertension: Secondary | ICD-10-CM | POA: Diagnosis not present

## 2022-11-08 DIAGNOSIS — I2510 Atherosclerotic heart disease of native coronary artery without angina pectoris: Secondary | ICD-10-CM | POA: Diagnosis not present

## 2022-11-08 DIAGNOSIS — Z4781 Encounter for orthopedic aftercare following surgical amputation: Secondary | ICD-10-CM | POA: Diagnosis not present

## 2022-11-08 NOTE — Progress Notes (Signed)
Office Visit Note   Patient: [name] [name]           Date of Birth: September 10, 1935           MRN: [id number] Visit Date: 11/08/2022              Requested by: Nelwyn Salisbury, MD [address] [place],  [place] [address] PCP: Nelwyn Salisbury, MD  Chief Complaint  Patient presents with   Right Foot - Routine Post Op    10/22/22 right second toe amputation      HPI: Patient is an 87 year old gentleman who is almost 3 weeks status post right foot second ray amputation.  Assessment & Plan: Visit Diagnoses:  1. History of partial ray amputation of second toe of right foot (HCC)     Plan: Sutures are harvested silicone spacer was provided he may advance his activities as tolerated. Patient states he was told not to go to a dentist for 3 months after surgery.  Recommended patient follow-up with his dental care he will need dental intervention before his valve intervention. Follow-Up Instructions: Return in about 4 weeks (around 12/06/2022).   Ortho Exam  Patient is alert, oriented, no adenopathy, well-dressed, normal affect, normal respiratory effort. Examination the incision is well-healed there is some mild redness that resolves with elevation.  No drainage.  Sutures are harvested.  Imaging: No results found. No images are attached to the encounter.  Labs: Lab Results  Component Value Date   HGBA1C  12/07/2007    5.5 (NOTE)   The ADA recommends the following therapeutic goal for glycemic   control related to Hgb A1C measurement:   Goal of Therapy:   < 7.0% Hgb A1C   Reference: American Diabetes Association: Clinical Practice   Recommendations 2008, Diabetes Care,  2008, 31:(Suppl 1).   ESRSEDRATE 7 10/20/2022   CRP <0.5 10/20/2022   REPTSTATUS 10/25/2022 FINAL 10/20/2022   CULT  10/20/2022    NO GROWTH 5 DAYS Performed at [facility], [address], [place], [place] [address]      Lab Results  Component Value Date   ALBUMIN 4.1 10/19/2022   ALBUMIN 4.8  01/09/2019   ALBUMIN 4.6 10/19/2017    Lab Results  Component Value Date   MG 2.1 01/12/2016   No results found for: "VD25OH"  No results found for: "PREALBUMIN"    Latest Ref Rng & Units 10/22/2022    1:14 AM 10/21/2022    8:15 AM 10/20/2022    7:12 AM  CBC EXTENDED  WBC 4.0 - 10.5 K/uL 7.7  8.4  6.7   RBC 4.22 - 5.81 MIL/uL 4.15  4.78  4.43   Hemoglobin 13.0 - 17.0 g/dL 56.4  33.2  95.1   HCT 39.0 - 52.0 % 39.1  45.7  41.8   Platelets 150 - 400 K/uL 460  505  542      There is no height or weight on file to calculate BMI.  Orders:  No orders of the defined types were placed in this encounter.  No orders of the defined types were placed in this encounter.    Procedures: No procedures performed  Clinical Data: No additional findings.  ROS:  All other systems negative, except as noted in the HPI. Review of Systems  Objective: Vital Signs: There were no vitals taken for this visit.  Specialty Comments:  No specialty comments available.  PMFS History: Patient Active Problem List   Diagnosis Date Noted  Osteomyelitis of second toe of right foot (HCC) 10/20/2022   OA (osteoarthritis) of knee 10/30/2017   Aortic valve stenosis 07/18/2017   Chronic pain of right knee 07/18/2017   Osteoarthritis of knee 05/31/2017   Abscess of left groin 05/07/2016   Pre-syncope    Coronary artery disease    OA (osteoarthritis) of hip 04/05/2013   Lateral meniscal tear 10/03/2012   CONTACT DERMATITIS 12/12/2008   APHTHOUS ULCERS 09/22/2008   BOILS, RECURRENT 02/18/2008   Coronary atherosclerosis 12/10/2007   Elevated lipids 02/28/2007   Essential hypertension 02/28/2007   Past Medical History:  Diagnosis Date   Abscess of groin, left    Arthritis    R hip & knees   Cancer (HCC)    basal cell, history of    Closed fracture of right foot    a. 08/2014 Fx of R fifth metatarsal (Jones Fx).   Coronary artery disease    a. 11/2007 Cath/PCI: LM nl, LAD 40-42m, d1 40-50ost,  LCX nl, RCA 90d (Platinum Study DES - promus), EF 60%.   Gastroesophageal reflux disease with hiatal hernia    H/O hiatal hernia    Hx MRSA infection    Hypertension    Pre-syncope    a. 08/2014 w/ sinus bradycardia in 40's in ED.    Family History  Problem Relation Age of Onset   Hypertension Sister    Hypertension Brother    Stomach cancer Father        family hx   Hypertension Other        family hx   Heart disease Other        family hx   Heart attack Neg Hx    Stroke Neg Hx     Past Surgical History:  Procedure Laterality Date   AMPUTATION Right 10/22/2022   Procedure: AMPUTATION 2nd TOE;  Surgeon: [name], MD;  Location: [facility] OR;  Service: Orthopedics;  Laterality: Right;   APPENDECTOMY     CARDIAC CATHETERIZATION  12/06/07   60%   COLONOSCOPY  06/08/2015   per Dr. Ewing Schlein, no polyps    CORONARY ANGIOPLASTY WITH STENT PLACEMENT  11/2007   HERNIA REPAIR     KNEE ARTHROSCOPY Right 10/03/2012   Procedure: RIGHT KNEEE ARTHROSCOPY WITH DEBRIDEMENT ;  Surgeon: Loanne Drilling, MD;  Location: [facility];  Service: Orthopedics;  Laterality: Right;   SHOULDER SURGERY  05/2016   rotator cuff and ligament repair    TOTAL HIP ARTHROPLASTY Right 04/05/2013   Procedure: RIGHT TOTAL HIP ARTHROPLASTY ANTERIOR APPROACH;  Surgeon: Loanne Drilling, MD;  Location: [facility] OR;  Service: Orthopedics;  Laterality: Right;   TOTAL KNEE ARTHROPLASTY Right 10/30/2017   Procedure: RIGHT TOTAL KNEE ARTHROPLASTY;  Surgeon: Ollen Gross, MD;  Location: [facility];  Service: Orthopedics;  Laterality: Right;   YAG LASER APPLICATION     for glaucoma - both eyes, no drops as of yet    Social History   Occupational History   Not on file  Tobacco Use   Smoking status: Former    Types: Cigarettes    Quit date: 09/28/1961    Years since quitting: 61.1   Smokeless tobacco: Never  Vaping Use   Vaping Use: Never used  Substance and Sexual Activity   Alcohol use: Not Currently    Comment: occ   Drug use: No    Sexual activity: Not on file

## 2022-11-10 DIAGNOSIS — M1712 Unilateral primary osteoarthritis, left knee: Secondary | ICD-10-CM | POA: Diagnosis not present

## 2022-11-10 DIAGNOSIS — Z4781 Encounter for orthopedic aftercare following surgical amputation: Secondary | ICD-10-CM | POA: Diagnosis not present

## 2022-11-10 DIAGNOSIS — I252 Old myocardial infarction: Secondary | ICD-10-CM | POA: Diagnosis not present

## 2022-11-10 DIAGNOSIS — K219 Gastro-esophageal reflux disease without esophagitis: Secondary | ICD-10-CM | POA: Diagnosis not present

## 2022-11-10 DIAGNOSIS — I10 Essential (primary) hypertension: Secondary | ICD-10-CM | POA: Diagnosis not present

## 2022-11-10 DIAGNOSIS — I2510 Atherosclerotic heart disease of native coronary artery without angina pectoris: Secondary | ICD-10-CM | POA: Diagnosis not present

## 2022-11-14 DIAGNOSIS — K219 Gastro-esophageal reflux disease without esophagitis: Secondary | ICD-10-CM | POA: Diagnosis not present

## 2022-11-14 DIAGNOSIS — I2510 Atherosclerotic heart disease of native coronary artery without angina pectoris: Secondary | ICD-10-CM | POA: Diagnosis not present

## 2022-11-14 DIAGNOSIS — M1712 Unilateral primary osteoarthritis, left knee: Secondary | ICD-10-CM | POA: Diagnosis not present

## 2022-11-14 DIAGNOSIS — I252 Old myocardial infarction: Secondary | ICD-10-CM | POA: Diagnosis not present

## 2022-11-14 DIAGNOSIS — I10 Essential (primary) hypertension: Secondary | ICD-10-CM | POA: Diagnosis not present

## 2022-11-14 DIAGNOSIS — Z4781 Encounter for orthopedic aftercare following surgical amputation: Secondary | ICD-10-CM | POA: Diagnosis not present

## 2022-11-22 DIAGNOSIS — K219 Gastro-esophageal reflux disease without esophagitis: Secondary | ICD-10-CM | POA: Diagnosis not present

## 2022-11-22 DIAGNOSIS — M1712 Unilateral primary osteoarthritis, left knee: Secondary | ICD-10-CM | POA: Diagnosis not present

## 2022-11-22 DIAGNOSIS — Z4781 Encounter for orthopedic aftercare following surgical amputation: Secondary | ICD-10-CM | POA: Diagnosis not present

## 2022-11-22 DIAGNOSIS — I2510 Atherosclerotic heart disease of native coronary artery without angina pectoris: Secondary | ICD-10-CM | POA: Diagnosis not present

## 2022-11-22 DIAGNOSIS — I10 Essential (primary) hypertension: Secondary | ICD-10-CM | POA: Diagnosis not present

## 2022-11-22 DIAGNOSIS — I252 Old myocardial infarction: Secondary | ICD-10-CM | POA: Diagnosis not present

## 2022-11-26 DIAGNOSIS — Z955 Presence of coronary angioplasty implant and graft: Secondary | ICD-10-CM | POA: Diagnosis not present

## 2022-11-26 DIAGNOSIS — I252 Old myocardial infarction: Secondary | ICD-10-CM | POA: Diagnosis not present

## 2022-11-26 DIAGNOSIS — Z87891 Personal history of nicotine dependence: Secondary | ICD-10-CM | POA: Diagnosis not present

## 2022-11-26 DIAGNOSIS — Z89421 Acquired absence of other right toe(s): Secondary | ICD-10-CM | POA: Diagnosis not present

## 2022-11-26 DIAGNOSIS — M1712 Unilateral primary osteoarthritis, left knee: Secondary | ICD-10-CM | POA: Diagnosis not present

## 2022-11-26 DIAGNOSIS — Z7982 Long term (current) use of aspirin: Secondary | ICD-10-CM | POA: Diagnosis not present

## 2022-11-26 DIAGNOSIS — I2510 Atherosclerotic heart disease of native coronary artery without angina pectoris: Secondary | ICD-10-CM | POA: Diagnosis not present

## 2022-11-26 DIAGNOSIS — K219 Gastro-esophageal reflux disease without esophagitis: Secondary | ICD-10-CM | POA: Diagnosis not present

## 2022-11-26 DIAGNOSIS — Z96652 Presence of left artificial knee joint: Secondary | ICD-10-CM | POA: Diagnosis not present

## 2022-11-26 DIAGNOSIS — Z4781 Encounter for orthopedic aftercare following surgical amputation: Secondary | ICD-10-CM | POA: Diagnosis not present

## 2022-11-26 DIAGNOSIS — Z85828 Personal history of other malignant neoplasm of skin: Secondary | ICD-10-CM | POA: Diagnosis not present

## 2022-11-26 DIAGNOSIS — Z96641 Presence of right artificial hip joint: Secondary | ICD-10-CM | POA: Diagnosis not present

## 2022-11-26 DIAGNOSIS — Z8781 Personal history of (healed) traumatic fracture: Secondary | ICD-10-CM | POA: Diagnosis not present

## 2022-11-26 DIAGNOSIS — I10 Essential (primary) hypertension: Secondary | ICD-10-CM | POA: Diagnosis not present

## 2022-12-01 DIAGNOSIS — Z4781 Encounter for orthopedic aftercare following surgical amputation: Secondary | ICD-10-CM | POA: Diagnosis not present

## 2022-12-01 DIAGNOSIS — I10 Essential (primary) hypertension: Secondary | ICD-10-CM | POA: Diagnosis not present

## 2022-12-01 DIAGNOSIS — M1712 Unilateral primary osteoarthritis, left knee: Secondary | ICD-10-CM | POA: Diagnosis not present

## 2022-12-01 DIAGNOSIS — K219 Gastro-esophageal reflux disease without esophagitis: Secondary | ICD-10-CM | POA: Diagnosis not present

## 2022-12-01 DIAGNOSIS — I2510 Atherosclerotic heart disease of native coronary artery without angina pectoris: Secondary | ICD-10-CM | POA: Diagnosis not present

## 2022-12-01 DIAGNOSIS — I252 Old myocardial infarction: Secondary | ICD-10-CM | POA: Diagnosis not present

## 2022-12-06 ENCOUNTER — Ambulatory Visit: Payer: Medicare Other | Admitting: Orthopedic Surgery

## 2022-12-06 DIAGNOSIS — Z89421 Acquired absence of other right toe(s): Secondary | ICD-10-CM

## 2022-12-07 DIAGNOSIS — M1712 Unilateral primary osteoarthritis, left knee: Secondary | ICD-10-CM | POA: Diagnosis not present

## 2022-12-07 DIAGNOSIS — Z4781 Encounter for orthopedic aftercare following surgical amputation: Secondary | ICD-10-CM | POA: Diagnosis not present

## 2022-12-07 DIAGNOSIS — I2510 Atherosclerotic heart disease of native coronary artery without angina pectoris: Secondary | ICD-10-CM | POA: Diagnosis not present

## 2022-12-07 DIAGNOSIS — I10 Essential (primary) hypertension: Secondary | ICD-10-CM | POA: Diagnosis not present

## 2022-12-07 DIAGNOSIS — K219 Gastro-esophageal reflux disease without esophagitis: Secondary | ICD-10-CM | POA: Diagnosis not present

## 2022-12-07 DIAGNOSIS — I252 Old myocardial infarction: Secondary | ICD-10-CM | POA: Diagnosis not present

## 2022-12-10 ENCOUNTER — Other Ambulatory Visit: Payer: Self-pay | Admitting: Cardiovascular Disease

## 2022-12-18 ENCOUNTER — Encounter: Payer: Self-pay | Admitting: Orthopedic Surgery

## 2022-12-18 NOTE — Progress Notes (Signed)
Office Visit Note   Patient: [name] [name]           Date of Birth: 30-May-1935           MRN: [id number] Visit Date: 12/06/2022              Requested by: Nelwyn Salisbury, MD [address] [place],  [place] [address] PCP: Nelwyn Salisbury, MD  Chief Complaint  Patient presents with   Right Foot - Routine Post Op     10/22/22 right second toe amputation        HPI: Patient is an 87 year old gentleman who is status post right foot second ray amputation.  Assessment & Plan: Visit Diagnoses:  1. History of partial ray amputation of second toe of right foot (HCC)     Plan: Continue with regular shoewear.  Follow-Up Instructions: No follow-ups on file.   Ortho Exam  Patient is alert, oriented, no adenopathy, well-dressed, normal affect, normal respiratory effort. Examination the incision is well-healed the ulcer is healed.  Patient has a silicone spacer.  Imaging: No results found.   Labs: Lab Results  Component Value Date   HGBA1C  12/07/2007    5.5 (NOTE)   The ADA recommends the following therapeutic goal for glycemic   control related to Hgb A1C measurement:   Goal of Therapy:   < 7.0% Hgb A1C   Reference: American Diabetes Association: Clinical Practice   Recommendations 2008, Diabetes Care,  2008, 31:(Suppl 1).   ESRSEDRATE 7 10/20/2022   CRP <0.5 10/20/2022   REPTSTATUS 10/25/2022 FINAL 10/20/2022   CULT  10/20/2022    NO GROWTH 5 DAYS Performed at [facility], [address] [place], [place] [address]      Lab Results  Component Value Date   ALBUMIN 4.1 10/19/2022   ALBUMIN 4.8 01/09/2019   ALBUMIN 4.6 10/19/2017    Lab Results  Component Value Date   MG 2.1 01/12/2016   No results found for: "VD25OH"  No results found for: "PREALBUMIN"    Latest Ref Rng & Units 10/22/2022    1:14 AM 10/21/2022    8:15 AM 10/20/2022    7:12 AM  CBC EXTENDED  WBC 4.0 - 10.5 K/uL 7.7  8.4  6.7   RBC 4.22 - 5.81 MIL/uL 4.15  4.78  4.43    Hemoglobin 13.0 - 17.0 g/dL 82.9  56.2  13.0   HCT 39.0 - 52.0 % 39.1  45.7  41.8   Platelets 150 - 400 K/uL 460  505  542      There is no height or weight on file to calculate BMI.  Orders:  No orders of the defined types were placed in this encounter.  No orders of the defined types were placed in this encounter.    Procedures: No procedures performed  Clinical Data: No additional findings.  ROS:  All other systems negative, except as noted in the HPI. Review of Systems  Objective: Vital Signs: There were no vitals taken for this visit.  Specialty Comments:  No specialty comments available.  PMFS History: Patient Active Problem List   Diagnosis Date Noted   Osteomyelitis of second toe of right foot (HCC) 10/20/2022   OA (osteoarthritis) of knee 10/30/2017   Aortic valve stenosis 07/18/2017   Chronic pain of right knee 07/18/2017   Osteoarthritis of knee 05/31/2017   Abscess of left groin 05/07/2016   Pre-syncope    Coronary artery disease  OA (osteoarthritis) of hip 04/05/2013   Lateral meniscal tear 10/03/2012   CONTACT DERMATITIS 12/12/2008   APHTHOUS ULCERS 09/22/2008   BOILS, RECURRENT 02/18/2008   Coronary atherosclerosis 12/10/2007   Elevated lipids 02/28/2007   Essential hypertension 02/28/2007   Past Medical History:  Diagnosis Date   Abscess of groin, left    Arthritis    R hip & knees   Cancer (HCC)    basal cell, history of    Closed fracture of right foot    a. 08/2014 Fx of R fifth metatarsal (Jones Fx).   Coronary artery disease    a. 11/2007 Cath/PCI: LM nl, LAD 40-19m, d1 40-50ost, LCX nl, RCA 90d (Platinum Study DES - promus), EF 60%.   Gastroesophageal reflux disease with hiatal hernia    H/O hiatal hernia    Hx MRSA infection    Hypertension    Pre-syncope    a. 08/2014 w/ sinus bradycardia in 40's in ED.    Family History  Problem Relation Age of Onset   Hypertension Sister    Hypertension Brother    Stomach cancer  Father        family hx   Hypertension Other        family hx   Heart disease Other        family hx   Heart attack Neg Hx    Stroke Neg Hx     Past Surgical History:  Procedure Laterality Date   AMPUTATION Right 10/22/2022   Procedure: AMPUTATION 2nd TOE;  Surgeon: [name], MD;  Location: [facility] OR;  Service: Orthopedics;  Laterality: Right;   APPENDECTOMY     CARDIAC CATHETERIZATION  12/06/07   60%   COLONOSCOPY  06/08/2015   per Dr. Ewing Schlein, no polyps    CORONARY ANGIOPLASTY WITH STENT PLACEMENT  11/2007   HERNIA REPAIR     KNEE ARTHROSCOPY Right 10/03/2012   Procedure: RIGHT KNEEE ARTHROSCOPY WITH DEBRIDEMENT ;  Surgeon: Loanne Drilling, MD;  Location: [facility];  Service: Orthopedics;  Laterality: Right;   SHOULDER SURGERY  05/2016   rotator cuff and ligament repair    TOTAL HIP ARTHROPLASTY Right 04/05/2013   Procedure: RIGHT TOTAL HIP ARTHROPLASTY ANTERIOR APPROACH;  Surgeon: Loanne Drilling, MD;  Location: [facility];  Service: Orthopedics;  Laterality: Right;   TOTAL KNEE ARTHROPLASTY Right 10/30/2017   Procedure: RIGHT TOTAL KNEE ARTHROPLASTY;  Surgeon: Ollen Gross, MD;  Location: [facility];  Service: Orthopedics;  Laterality: Right;   YAG LASER APPLICATION     for glaucoma - both eyes, no drops as of yet    Social History   Occupational History   Not on file  Tobacco Use   Smoking status: Former    Current packs/day: 0.00    Types: Cigarettes    Quit date: 09/28/1961    Years since quitting: 61.2   Smokeless tobacco: Never  Vaping Use   Vaping status: Never Used  Substance and Sexual Activity   Alcohol use: Not Currently    Comment: occ   Drug use: No   Sexual activity: Not on file

## 2023-01-04 DIAGNOSIS — D485 Neoplasm of uncertain behavior of skin: Secondary | ICD-10-CM | POA: Diagnosis not present

## 2023-01-04 DIAGNOSIS — L578 Other skin changes due to chronic exposure to nonionizing radiation: Secondary | ICD-10-CM | POA: Diagnosis not present

## 2023-01-04 DIAGNOSIS — D225 Melanocytic nevi of trunk: Secondary | ICD-10-CM | POA: Diagnosis not present

## 2023-01-04 DIAGNOSIS — R4789 Other speech disturbances: Secondary | ICD-10-CM | POA: Diagnosis not present

## 2023-01-04 DIAGNOSIS — D0461 Carcinoma in situ of skin of right upper limb, including shoulder: Secondary | ICD-10-CM | POA: Diagnosis not present

## 2023-01-04 DIAGNOSIS — Z85828 Personal history of other malignant neoplasm of skin: Secondary | ICD-10-CM | POA: Diagnosis not present

## 2023-01-04 DIAGNOSIS — L859 Epidermal thickening, unspecified: Secondary | ICD-10-CM | POA: Diagnosis not present

## 2023-01-04 DIAGNOSIS — L570 Actinic keratosis: Secondary | ICD-10-CM | POA: Diagnosis not present

## 2023-01-04 DIAGNOSIS — L821 Other seborrheic keratosis: Secondary | ICD-10-CM | POA: Diagnosis not present

## 2023-01-07 ENCOUNTER — Other Ambulatory Visit: Payer: Self-pay | Admitting: Cardiovascular Disease

## 2023-01-25 DIAGNOSIS — L905 Scar conditions and fibrosis of skin: Secondary | ICD-10-CM | POA: Diagnosis not present

## 2023-01-25 DIAGNOSIS — L570 Actinic keratosis: Secondary | ICD-10-CM | POA: Diagnosis not present

## 2023-01-25 DIAGNOSIS — C44622 Squamous cell carcinoma of skin of right upper limb, including shoulder: Secondary | ICD-10-CM | POA: Diagnosis not present

## 2023-02-25 DIAGNOSIS — Z23 Encounter for immunization: Secondary | ICD-10-CM | POA: Diagnosis not present

## 2023-03-08 ENCOUNTER — Encounter: Payer: Self-pay | Admitting: Family Medicine

## 2023-03-08 ENCOUNTER — Ambulatory Visit (INDEPENDENT_AMBULATORY_CARE_PROVIDER_SITE_OTHER): Payer: Medicare Other | Admitting: Family Medicine

## 2023-03-08 VITALS — BP 118/76 | HR 60 | Temp 97.6°F | Wt 141.8 lb

## 2023-03-08 DIAGNOSIS — F419 Anxiety disorder, unspecified: Secondary | ICD-10-CM | POA: Diagnosis not present

## 2023-03-08 DIAGNOSIS — R413 Other amnesia: Secondary | ICD-10-CM

## 2023-03-08 MED ORDER — SERTRALINE HCL 50 MG PO TABS: 50.0000 mg | ORAL_TABLET | Freq: Every day | ORAL | 3 refills | Status: DC

## 2023-03-08 NOTE — Progress Notes (Signed)
Subjective:    Patient ID: [name] [name], male    DOB: 1935/07/04, 87 y.o.   MRN: [id number]  HPI Here with his wife to discuss anxiety and memory issues. He has been dealing with some memory loss for several years, and they both agree on this. He gives examples of forgetting the names of neighbors or friends he has known for years. He never forgets family names. Then when he can't remember something he gets very upset and angry. He is very anxious most of the time, and he worries about things like his health, their finances, etc. Neither of them have seen many symptoms of depression. He sleeps well. Appetite is good.    Review of Systems  Constitutional: Negative.   Respiratory: Negative.    Cardiovascular: Negative.   Neurological:  Negative for dizziness, tremors, seizures, syncope, facial asymmetry, speech difficulty, weakness, light-headedness, numbness and headaches.  Psychiatric/Behavioral:  Positive for agitation and confusion. Negative for behavioral problems, dysphoric mood, hallucinations and sleep disturbance. The patient is nervous/anxious.        Objective:   Physical Exam Constitutional:      Appearance: Normal appearance.  Cardiovascular:     Rate and Rhythm: Normal rate and regular rhythm.     Pulses: Normal pulses.  Pulmonary:     Effort: Pulmonary effort is normal.     Breath sounds: Normal breath sounds.  Neurological:     Mental Status: He is alert and oriented to person, place, and time.  Psychiatric:        Behavior: Behavior normal.        Thought Content: Thought content normal.     Comments: He is anxious today            Assessment & Plan:  He is dealing with 2 distinct issues, though they influence each other. We will treat the anxiety with Zoloft 50 mg daily. He will follow up here in 3-4 weeks. Once we get the anxiety under better control, we will address the memory issues in more detail. We spent a total of ( 32  ) minutes reviewing records  and discussing these issues.  Gershon Crane, MD

## 2023-03-10 DIAGNOSIS — M25511 Pain in right shoulder: Secondary | ICD-10-CM | POA: Diagnosis not present

## 2023-03-29 DIAGNOSIS — M25511 Pain in right shoulder: Secondary | ICD-10-CM | POA: Diagnosis not present

## 2023-03-30 ENCOUNTER — Other Ambulatory Visit: Payer: Self-pay | Admitting: Family Medicine

## 2023-04-03 ENCOUNTER — Ambulatory Visit (INDEPENDENT_AMBULATORY_CARE_PROVIDER_SITE_OTHER): Payer: Medicare Other | Admitting: Family Medicine

## 2023-04-03 ENCOUNTER — Encounter: Payer: Self-pay | Admitting: Family Medicine

## 2023-04-03 VITALS — BP 110/60 | HR 53 | Temp 98.0°F | Wt 139.4 lb

## 2023-04-03 DIAGNOSIS — F419 Anxiety disorder, unspecified: Secondary | ICD-10-CM

## 2023-04-03 DIAGNOSIS — R413 Other amnesia: Secondary | ICD-10-CM

## 2023-04-03 MED ORDER — MEMANTINE HCL 10 MG PO TABS
10.0000 mg | ORAL_TABLET | Freq: Two times a day (BID) | ORAL | 3 refills | Status: DC
Start: 2023-04-03 — End: 2023-04-26

## 2023-04-03 MED ORDER — SERTRALINE HCL 100 MG PO TABS
100.0000 mg | ORAL_TABLET | Freq: Every day | ORAL | 3 refills | Status: DC
Start: 2023-04-03 — End: 2023-04-26

## 2023-04-03 NOTE — Progress Notes (Signed)
   Subjective:    Patient ID: [name] [name], male    DOB: July 26, 1935, 87 y.o.   MRN: [id number]  HPI Here with is wife to follow up on anxiety and memory loss. For the past few weeks he has been taking Zoloft 50 mg daily, and they have both seen an improvement. He still gets frustrated with his memory, but he does not get nearly as upset as he used to. No side effects to report. Today he also wants to address the memory loss.    Review of Systems  Constitutional: Negative.   Respiratory: Negative.    Psychiatric/Behavioral:  Positive for agitation. Negative for dysphoric mood and sleep disturbance. The patient is nervous/anxious.        Objective:   Physical Exam Constitutional:      Appearance: Normal appearance.  Cardiovascular:     Rate and Rhythm: Normal rate and regular rhythm.     Pulses: Normal pulses.     Heart sounds: Normal heart sounds.  Pulmonary:     Effort: Pulmonary effort is normal.     Breath sounds: Normal breath sounds.  Neurological:     General: No focal deficit present.     Mental Status: He is alert and oriented to person, place, and time.  Psychiatric:        Mood and Affect: Mood normal.        Behavior: Behavior normal.        Thought Content: Thought content normal.           Assessment & Plan:  For the anxiety, we will increase the Zoloft to 100 mg daily. For the memory loss, we will try Namenda 10 mg BID. Recheck in 3-4 weeks.  Gershon Crane, MD

## 2023-04-25 ENCOUNTER — Other Ambulatory Visit: Payer: Self-pay | Admitting: Family Medicine

## 2023-04-25 DIAGNOSIS — H26493 Other secondary cataract, bilateral: Secondary | ICD-10-CM | POA: Diagnosis not present

## 2023-04-25 DIAGNOSIS — H04123 Dry eye syndrome of bilateral lacrimal glands: Secondary | ICD-10-CM | POA: Diagnosis not present

## 2023-04-25 DIAGNOSIS — H18513 Endothelial corneal dystrophy, bilateral: Secondary | ICD-10-CM | POA: Diagnosis not present

## 2023-04-25 DIAGNOSIS — Z961 Presence of intraocular lens: Secondary | ICD-10-CM | POA: Diagnosis not present

## 2023-05-01 NOTE — Progress Notes (Signed)
CARDIOLOGY OFFICE NOTE  Date:  05/08/2023    [name] [name] Date of Birth: 03-11-1936 Medical Record #[id number]  PCP:  Nelwyn Salisbury, MD  Cardiologist:  Eden Emms  Chief Complaint  Patient presents with   Aortic stenosis, moderate   Coronary artery disease involving native coronary artery of    History of Present Illness:  87 y.o. history of CAD with DES to RCA 2009. CRF;s HTN, HLD SSS with bradycardia beta blockers avoided  Myovue 09/30/16 old inferior MI no ischemia EF 56% low risk  Daughter works at [employer] and Rx Leukemia  2nd in charge  Two grand kids in town   Had uneventful right TKR with Dr Despina Hick on October 30 2017  Peritonsillar abscess August 2021   Echo 10/05/20  fairly stable peak velocity 24m/sec gradients 20/36 mmHg DVI 0.32 and AVA 1.1 cm2  Echo 10/15/21 :  mild/mod MR moderate AS mean gradients 26/47.6 mmHg DVI 0.27 AVA 0.85 cm2 Echo 04/28/22 mean gradient 30 peak 51 mmHg AVA 0.9 cm2 He is still asymptomatic Echo 11/07/22 with mean gradient increased from 24 mmHg to 29 and peal from 45 to 53 mmHg No symptoms Echo 05/08/23  with mean gradient 30 peak 52 mmHg AVA 0.85 cm2    Given some progression discussed likely need for TAVR in next 2 years  He has no exertional symptoms  Sees dentist every 4 months and discussed importance of this if he is getting a valve in next year /2  Carotid 05/04/22 plaque no stenosis   Had pinky toe on right foot amputated for infection Has seen dentist and had left sided tooth pulled Sees primary for memory issues and anxiety on Zoloft Started on Namenda for his memory 04/03/23    Past Medical History:  Diagnosis Date   Abscess of groin, left    Arthritis    R hip & knees   Cancer (HCC)    basal cell, history of    Closed fracture of right foot    a. 08/2014 Fx of R fifth metatarsal (Jones Fx).   Coronary artery disease    a. 11/2007 Cath/PCI: LM nl, LAD 40-64m, d1 40-50ost, LCX nl, RCA 90d (Platinum Study DES -  promus), EF 60%.   Gastroesophageal reflux disease with hiatal hernia    H/O hiatal hernia    Hx MRSA infection    Hypertension    Pre-syncope    a. 08/2014 w/ sinus bradycardia in 40's in ED.    Past Surgical History:  Procedure Laterality Date   AMPUTATION Right 10/22/2022   Procedure: AMPUTATION 2nd TOE;  Surgeon: [name], MD;  Location: [facility] OR;  Service: Orthopedics;  Laterality: Right;   APPENDECTOMY     CARDIAC CATHETERIZATION  12/06/07   60%   COLONOSCOPY  06/08/2015   per Dr. Ewing Schlein, no polyps    CORONARY ANGIOPLASTY WITH STENT PLACEMENT  11/2007   HERNIA REPAIR     KNEE ARTHROSCOPY Right 10/03/2012   Procedure: RIGHT KNEEE ARTHROSCOPY WITH DEBRIDEMENT ;  Surgeon: Loanne Drilling, MD;  Location: [facility] ORS;  Service: Orthopedics;  Laterality: Right;   SHOULDER SURGERY  05/2016   rotator cuff and ligament repair    TOTAL HIP ARTHROPLASTY Right 04/05/2013   Procedure: RIGHT TOTAL HIP ARTHROPLASTY ANTERIOR APPROACH;  Surgeon: Loanne Drilling, MD;  Location: [facility] OR;  Service: Orthopedics;  Laterality: Right;   TOTAL KNEE ARTHROPLASTY Right 10/30/2017   Procedure: RIGHT TOTAL KNEE ARTHROPLASTY;  Surgeon: Lequita Halt,  Homero Fellers, MD;  Location: [facility] ORS;  Service: Orthopedics;  Laterality: Right;   YAG LASER APPLICATION     for glaucoma - both eyes, no drops as of yet      Medications: Current Outpatient Medications  Medication Sig Dispense Refill   acetaminophen (TYLENOL) 325 MG tablet Take 1-2 tablets (325-650 mg total) by mouth every 6 (six) hours as needed for mild pain (pain score 1-3 or temp > 100.5).     aspirin 81 MG EC tablet Take 81 mg by mouth daily. Swallow whole.     benazepril-hydrochlorthiazide (LOTENSIN HCT) 20-25 MG tablet TAKE 1 TABLET BY MOUTH EVERY DAY 90 tablet 3   cholecalciferol (VITAMIN D) 1000 UNITS tablet Take 1,000 Units by mouth daily.      loperamide (IMODIUM) 2 MG capsule Take 1 capsule (2 mg total) by mouth as needed for diarrhea or loose stools. 30 capsule 0    memantine (NAMENDA) 10 MG tablet TAKE 1 TABLET BY MOUTH TWICE A DAY 180 tablet 2   Multiple Vitamin (MULTIVITAMIN WITH MINERALS) TABS tablet Take 1 tablet by mouth daily with breakfast.     nitroGLYCERIN (NITROSTAT) 0.4 MG SL tablet Place 1 tablet (0.4 mg total) under the tongue every 5 (five) minutes as needed for chest pain (3 doses MAX). 25 tablet 3   Probiotic Product (PROBIOTIC PO) Take 1 capsule by mouth daily with breakfast.     rosuvastatin (CRESTOR) 20 MG tablet TAKE 1 TABLET BY MOUTH EVERY OTHER DAY 45 tablet 3   sertraline (ZOLOFT) 50 MG tablet Take 50 mg by mouth daily.     No current facility-administered medications for this visit.    Allergies: No Known Allergies  Social History: The patient  reports that he quit smoking about 61 years ago. His smoking use included cigarettes. He has never used smokeless tobacco. He reports that he does not currently use alcohol. He reports that he does not use drugs.   Family History: The patient's family history includes Heart disease in an other family member; Hypertension in his brother, sister, and another family member; Stomach cancer in his father.   Review of Systems: Please see the history of present illness.   Otherwise, the review of systems is positive for none.   All other systems are reviewed and negative.   Physical Exam: VS:  BP (!) 142/84 (BP Location: Left Arm, Patient Position: Sitting, Cuff Size: Normal)   Pulse (!) 55   Resp 16   Ht 5\' 11"  (1.803 m)   Wt 144 lb (65.3 kg)   SpO2 99%   BMI 20.08 kg/m  .  BMI Body mass index is 20.08 kg/m.  Wt Readings from Last 3 Encounters:  05/08/23 144 lb (65.3 kg)  04/03/23 139 lb 6.4 oz (63.2 kg)  03/08/23 141 lb 12.8 oz (64.3 kg)    Affect appropriate Healthy:  appears stated age HEENT: normal Neck supple with no adenopathy JVP normal no bruits no thyromegaly Lungs clear with no wheezing and good diaphragmatic motion Heart:  S1/S2 mild  AS  murmur, no rub, gallop  or click PMI normal Abdomen: benighn, BS positve, no tenderness, no AAA no bruit.  No HSM or HJR Distal pulses intact post right 4/5th toe amputation  No edema Neuro non-focal Skin warm and dry Post right TKR    LABORATORY DATA:  EKG:   10/04/19 NSR normal ECG 05/08/2023 SR rate 52 normal  05/08/2023 SR rate 72 PR 254 msec otherwise normal   Lab Results  Component Value Date   WBC 7.7 10/22/2022   HGB 13.6 10/22/2022   HCT 39.1 10/22/2022   PLT 460 (H) 10/22/2022   GLUCOSE 98 10/22/2022   CHOL 149 01/09/2019   TRIG 110.0 01/09/2019   HDL 70.10 01/09/2019   LDLCALC 57 01/09/2019   ALT 17 10/19/2022   AST 19 10/19/2022   NA 139 10/22/2022   K 3.4 (L) 10/22/2022   CL 102 10/22/2022   CREATININE 1.13 10/22/2022   BUN 15 10/22/2022   CO2 25 10/22/2022   TSH 1.83 01/09/2019   PSA 1.78 01/09/2019   INR 1.00 10/19/2017   HGBA1C  12/07/2007    5.5 (NOTE)   The ADA recommends the following therapeutic goal for glycemic   control related to Hgb A1C measurement:   Goal of Therapy:   < 7.0% Hgb A1C   Reference: American Diabetes Association: Clinical Practice   Recommendations 2008, Diabetes Care,  2008, 31:(Suppl 1).    BNP (last 3 results) No results for input(s): "BNP" in the last 8760 hours.  ProBNP (last 3 results) No results for input(s): "PROBNP" in the last 8760 hours.   Other Studies Reviewed Today:  Echo  11/07/22  Mean gradient 29 peak 53 mmHg    LHC 11/2007 LM: Normal LAD: Proximal to mid 40-50%, ostial D1 40-50% LCx: Normal RCA: Distal 90% EF 60% PCI: DES to the RCA    Assessment/Plan:  CAD:  Stable no angina medical Rx . Non ischemic myovue May 2018 no complications with general anesthesia and knee surgery June 2019    Benign essential HTN:  Well controlled.  Continue current medications and low sodium Dash type diet.     Hyperlipidemia:  Continue statin labs with primary Historically LDL <60   Bradycardia:  History of AV block and wenckebach  avoid beta blocker no high Grade AV block or indication for PPM  Aortic Stenosis:  TTE done 05/08/23 stable f/u 6 months no symptoms and mod/severe AS    Bruit:  Right side ASA  [place] 05/04/22 plaque no stenosis   Ortho:  Post right TKR 10/30/17 Dr Despina Hick improved    TTE in 6 months    Disposition:   f/u in 6 months   Charlton Haws

## 2023-05-08 ENCOUNTER — Ambulatory Visit (HOSPITAL_BASED_OUTPATIENT_CLINIC_OR_DEPARTMENT_OTHER): Payer: Medicare Other

## 2023-05-08 ENCOUNTER — Ambulatory Visit: Payer: Medicare Other | Attending: Cardiovascular Disease | Admitting: Cardiovascular Disease

## 2023-05-08 ENCOUNTER — Encounter: Payer: Self-pay | Admitting: Cardiovascular Disease

## 2023-05-08 VITALS — BP 142/84 | HR 55 | Resp 16 | Ht 71.0 in | Wt 144.0 lb

## 2023-05-08 DIAGNOSIS — I2510 Atherosclerotic heart disease of native coronary artery without angina pectoris: Secondary | ICD-10-CM

## 2023-05-08 DIAGNOSIS — I350 Nonrheumatic aortic (valve) stenosis: Secondary | ICD-10-CM

## 2023-05-08 LAB — ECHOCARDIOGRAM COMPLETE
AR max vel: 0.88 cm2
AV Area VTI: 0.87 cm2
AV Area mean vel: 0.86 cm2
AV Mean grad: 28 mm[Hg]
AV Peak grad: 51.2 mm[Hg]
Ao pk vel: 3.58 m/s
Area-P 1/2: 2.79 cm2
S' Lateral: 2.7 cm

## 2023-05-08 NOTE — Patient Instructions (Addendum)
Medication Instructions:  Your physician recommends that you continue on your current medications as directed. Please refer to the Current Medication list given to you today.  *If you need a refill on your cardiac medications before your next appointment, please call your pharmacy*  Lab Work: If you have labs (blood work) drawn today and your tests are completely normal, you will receive your results only by: MyChart Message (if you have MyChart) OR A paper copy in the mail If you have any lab test that is abnormal or we need to change your treatment, we will call you to review the results.  Testing/Procedures: Your physician has requested that you have an echocardiogram in 6 months, same day as office visit. Echocardiography is a painless test that uses sound waves to create images of your heart. It provides your doctor with information about the size and shape of your heart and how well your heart's chambers and valves are working. This procedure takes approximately one hour. There are no restrictions for this procedure. Please do NOT wear cologne, perfume, aftershave, or lotions (deodorant is allowed). Please arrive 15 minutes prior to your appointment time.  Please note: We ask at that you not bring children with you during ultrasound (echo/ vascular) testing. Due to room size and safety concerns, children are not allowed in the ultrasound rooms during exams. Our front office staff cannot provide observation of children in our lobby area while testing is being conducted. An adult accompanying a patient to their appointment will only be allowed in the ultrasound room at the discretion of the ultrasound technician under special circumstances. We apologize for any inconvenience. Follow-Up: At [facility], you and your health needs are our priority.  As part of our continuing mission to provide you with exceptional heart care, we have created designated Provider Care Teams.  These Care  Teams include your primary Cardiologist (physician) and Advanced Practice Providers (APPs -  Physician Assistants and Nurse Practitioners) who all work together to provide you with the care you need, when you need it.  We recommend signing up for the patient portal called "MyChart".  Sign up information is provided on this After Visit Summary.  MyChart is used to connect with patients for Virtual Visits (Telemedicine).  Patients are able to view lab/test results, encounter notes, upcoming appointments, etc.  Non-urgent messages can be sent to your provider as well.   To learn more about what you can do with MyChart, go to [email or web address].    Your next appointment:   6 month(s)  Provider:   Charlton Haws, MD

## 2023-05-26 ENCOUNTER — Encounter (HOSPITAL_COMMUNITY): Payer: Medicare Other

## 2023-07-25 ENCOUNTER — Other Ambulatory Visit: Payer: Self-pay | Admitting: Family Medicine

## 2023-09-07 DIAGNOSIS — C4442 Squamous cell carcinoma of skin of scalp and neck: Secondary | ICD-10-CM | POA: Diagnosis not present

## 2023-09-07 DIAGNOSIS — L821 Other seborrheic keratosis: Secondary | ICD-10-CM | POA: Diagnosis not present

## 2023-09-07 DIAGNOSIS — D225 Melanocytic nevi of trunk: Secondary | ICD-10-CM | POA: Diagnosis not present

## 2023-09-07 DIAGNOSIS — L720 Epidermal cyst: Secondary | ICD-10-CM | POA: Diagnosis not present

## 2023-09-07 DIAGNOSIS — D485 Neoplasm of uncertain behavior of skin: Secondary | ICD-10-CM | POA: Diagnosis not present

## 2023-09-07 DIAGNOSIS — L570 Actinic keratosis: Secondary | ICD-10-CM | POA: Diagnosis not present

## 2023-09-07 DIAGNOSIS — L578 Other skin changes due to chronic exposure to nonionizing radiation: Secondary | ICD-10-CM | POA: Diagnosis not present

## 2023-09-07 DIAGNOSIS — Z85828 Personal history of other malignant neoplasm of skin: Secondary | ICD-10-CM | POA: Diagnosis not present

## 2023-09-15 ENCOUNTER — Ambulatory Visit (INDEPENDENT_AMBULATORY_CARE_PROVIDER_SITE_OTHER): Admitting: Family Medicine

## 2023-09-15 ENCOUNTER — Encounter: Payer: Self-pay | Admitting: Family Medicine

## 2023-09-15 VITALS — BP 98/58 | HR 72 | Temp 98.4°F | Wt 144.6 lb

## 2023-09-15 DIAGNOSIS — S80811A Abrasion, right lower leg, initial encounter: Secondary | ICD-10-CM | POA: Diagnosis not present

## 2023-09-15 DIAGNOSIS — Z23 Encounter for immunization: Secondary | ICD-10-CM

## 2023-09-15 MED ORDER — CEPHALEXIN 500 MG PO CAPS
500.0000 mg | ORAL_CAPSULE | Freq: Three times a day (TID) | ORAL | 0 refills | Status: DC
Start: 2023-09-15 — End: 2023-10-19

## 2023-09-15 NOTE — Addendum Note (Signed)
 Addended by: Philbert Brave on: 09/15/2023 03:27 PM   Modules accepted: Orders

## 2023-09-15 NOTE — Progress Notes (Signed)
   Subjective:    Patient ID: [name] [name], male    DOB: July 10, 1935, 88 y.o.   MRN: [id number]  HPI Here for an injury that occurred at home 4 days ago. While working in his yard, he tripped in a bed of mulch and fell, scraping his right shin. Since then he has been dressing it daily. It is mildly tender.    Review of Systems  Constitutional: Negative.   Respiratory: Negative.    Cardiovascular: Negative.   Skin:  Positive for wound.       Objective:   Physical Exam Constitutional:      General: He is not in acute distress.    Appearance: Normal appearance.  Cardiovascular:     Rate and Rhythm: Normal rate and regular rhythm.     Pulses: Normal pulses.     Heart sounds: Normal heart sounds.  Pulmonary:     Effort: Pulmonary effort is normal.     Breath sounds: Normal breath sounds.  Skin:    Comments: There is a superficial abrasion on the right lower leg. This looks to be clean   Neurological:     Mental Status: He is alert.           Assessment & Plan:  Leg abrasion. This was dressed with Telfa and gauze. Given a TDaP. Cover with 7 days of keflex .  Corita Diego, MD

## 2023-09-26 ENCOUNTER — Other Ambulatory Visit: Payer: Self-pay | Admitting: Cardiovascular Disease

## 2023-10-05 NOTE — Progress Notes (Signed)
 CARDIOLOGY OFFICE NOTE  Date:  10/05/2023    [name] [name] Date of Birth: 1935/10/03 Medical Record #[id number]  PCP:  Donley Furth, MD  Cardiologist:  Ilsa Maltese chief complaint on file.   History of Present Illness:  88 y.o. history of CAD with DES to RCA 2009. CRF;s HTN, HLD SSS with bradycardia beta blockers avoided  Myovue 09/30/16 old inferior MI no ischemia EF 56% low risk  Daughter works at [employer] and [employer]  2nd in charge  Two grand kids in town   Had uneventful right TKR with Dr Rossie Coon on October 30 2017  Peritonsillar abscess August 2021   Echo 10/05/20  fairly stable peak velocity 62m/sec gradients 20/36 mmHg DVI 0.32 and AVA 1.1 cm2  Echo 10/15/21 :  mild/mod MR moderate AS mean gradients 26/47.6 mmHg DVI 0.27 AVA 0.85 cm2 Echo 04/28/22 mean gradient 30 peak 51 mmHg AVA 0.9 cm2 He is still asymptomatic Echo 11/07/22 with mean gradient increased from 24 mmHg to 29 and peal from 45 to 53 mmHg No symptoms Echo 05/08/23  with mean gradient 30 peak 52 mmHg AVA 0.85 cm2    Given some progression discussed likely need for TAVR in next 2 years  He has no exertional symptoms  Sees dentist every 4 months and discussed importance of this if he is getting a valve in next year /2  Carotid 05/04/22 plaque no stenosis   Had pinky toe on right foot amputated for infection Has seen dentist and had left sided tooth pulled Sees primary for memory issues and anxiety on Zoloft  Started on Namenda  for his memory 04/03/23   Had left neck skin cancer surgery last week His TTE from today is not ready to read  No chest pain, dyspnea or syncope   Past Medical History:  Diagnosis Date   Abscess of groin, left    Arthritis    R hip & knees   Cancer (HCC)    basal cell, history of    Closed fracture of right foot    a. 08/2014 Fx of R fifth metatarsal (Jones Fx).   Coronary artery disease    a. 11/2007 Cath/PCI: LM nl, LAD 40-12m, d1 40-50ost, LCX nl, RCA 90d  (Platinum Study DES - promus), EF 60%.   Gastroesophageal reflux disease with hiatal hernia    H/O hiatal hernia    Hx MRSA infection    Hypertension    Pre-syncope    a. 08/2014 w/ sinus bradycardia in 40's in ED.    Past Surgical History:  Procedure Laterality Date   AMPUTATION Right 10/22/2022   Procedure: AMPUTATION 2nd TOE;  Surgeon: Timothy Ford, MD;  Location: [facility] OR;  Service: Orthopedics;  Laterality: Right;   APPENDECTOMY     CARDIAC CATHETERIZATION  12/06/07   60%   COLONOSCOPY  06/08/2015   per Dr. Lavaughn Portland, no polyps    CORONARY ANGIOPLASTY WITH STENT PLACEMENT  11/2007   HERNIA REPAIR     KNEE ARTHROSCOPY Right 10/03/2012   Procedure: RIGHT KNEEE ARTHROSCOPY WITH DEBRIDEMENT ;  Surgeon: Aurther Blue, MD;  Location: [facility] ORS;  Service: Orthopedics;  Laterality: Right;   SHOULDER SURGERY  05/2016   rotator cuff and ligament repair    TOTAL HIP ARTHROPLASTY Right 04/05/2013   Procedure: RIGHT TOTAL HIP ARTHROPLASTY ANTERIOR APPROACH;  Surgeon: Aurther Blue, MD;  Location: [facility] OR;  Service: Orthopedics;  Laterality: Right;   TOTAL KNEE ARTHROPLASTY Right 10/30/2017  Procedure: RIGHT TOTAL KNEE ARTHROPLASTY;  Surgeon: Liliane Rei, MD;  Location: [facility] OR[facility];  Service: Orthopedics;  Laterality: Right;   YAG LASER APPLICATION     for glaucoma - both eyes, no drops as of yet      Medications: Current Outpatient Medications  Medication Sig Dispense Refill   acetaminophen  (TYLENOL ) 325 MG tablet Take 1-2 tablets (325-650 mg total) by mouth every 6 (six) hours as needed for mild pain (pain score 1-3 or temp > 100.5).     aspirin  81 MG EC tablet Take 81 mg by mouth daily. Swallow whole.     benazepril -hydrochlorthiazide (LOTENSIN  HCT) 20-25 MG tablet TAKE 1 TABLET BY MOUTH EVERY DAY 90 tablet 3   cephALEXin  (KEFLEX ) 500 MG capsule Take 1 capsule (500 mg total) by mouth 3 (three) times daily. 21 capsule 0   cholecalciferol  (VITAMIN D ) 1000 UNITS tablet Take 1,000 Units by mouth  daily.      loperamide  (IMODIUM ) 2 MG capsule Take 1 capsule (2 mg total) by mouth as needed for diarrhea or loose stools. 30 capsule 0   memantine  (NAMENDA ) 10 MG tablet TAKE 1 TABLET BY MOUTH TWICE A DAY 180 tablet 2   Multiple Vitamin (MULTIVITAMIN WITH MINERALS) TABS tablet Take 1 tablet by mouth daily with breakfast.     nitroGLYCERIN  (NITROSTAT ) 0.4 MG SL tablet Place 1 tablet (0.4 mg total) under the tongue every 5 (five) minutes as needed for chest pain (3 doses MAX). 25 tablet 3   Probiotic Product (PROBIOTIC PO) Take 1 capsule by mouth daily with breakfast.     rosuvastatin  (CRESTOR ) 20 MG tablet TAKE 1 TABLET BY MOUTH EVERY OTHER DAY 45 tablet 2   sertraline  (ZOLOFT ) 50 MG tablet TAKE 1 TABLET BY MOUTH EVERY DAY 90 tablet 0   No current facility-administered medications for this visit.    Allergies: No Known Allergies  Social History: The patient  reports that he quit smoking about 62 years ago. His smoking use included cigarettes. He has never used smokeless tobacco. He reports that he does not currently use alcohol. He reports that he does not use drugs.   Family History: The patient's family history includes Heart disease in an other family member; Hypertension in his brother, sister, and another family member; Stomach cancer in his father.   Review of Systems: Please see the history of present illness.   Otherwise, the review of systems is positive for none.   All other systems are reviewed and negative.   Physical Exam: VS:  There were no vitals taken for this visit. Aaron Aas  BMI There is no height or weight on file to calculate BMI.  Wt Readings from Last 3 Encounters:  09/15/23 144 lb 9.6 oz (65.6 kg)  05/08/23 144 lb (65.3 kg)  04/03/23 139 lb 6.4 oz (63.2 kg)    Affect appropriate Healthy:  appears stated age HEENT: normal Neck supple with no adenopathy JVP normal no bruits no thyromegaly Lungs clear with no wheezing and good diaphragmatic motion Heart:  S1/S2  mild  AS  murmur, no rub, gallop or click PMI normal Abdomen: benighn, BS positve, no tenderness, no AAA no bruit.  No HSM or HJR Distal pulses intact post right 4/5th toe amputation  No edema Neuro non-focal Skin warm and dry Post right TKR    LABORATORY DATA:  EKG:   10/04/19 NSR normal ECG 10/05/2023 SR rate 52 normal  10/05/2023 SR rate 72 PR 254 msec otherwise normal   Lab Results  Component Value Date   WBC 7.7 10/22/2022   HGB 13.6 10/22/2022   HCT 39.1 10/22/2022   PLT 460 (H) 10/22/2022   GLUCOSE 98 10/22/2022   CHOL 149 01/09/2019   TRIG 110.0 01/09/2019   HDL 70.10 01/09/2019   LDLCALC 57 01/09/2019   ALT 17 10/19/2022   AST 19 10/19/2022   NA 139 10/22/2022   K 3.4 (L) 10/22/2022   CL 102 10/22/2022   CREATININE 1.13 10/22/2022   BUN 15 10/22/2022   CO2 25 10/22/2022   TSH 1.83 01/09/2019   PSA 1.78 01/09/2019   INR 1.00 10/19/2017   HGBA1C  12/07/2007    5.5 (NOTE)   The ADA recommends the following therapeutic goal for glycemic   control related to Hgb A1C measurement:   Goal of Therapy:   < 7.0% Hgb A1C   Reference: American Diabetes Association: Clinical Practice   Recommendations 2008, Diabetes Care,  2008, 31:(Suppl 1).    BNP (last 3 results) No results for input(s): "BNP" in the last 8760 hours.  ProBNP (last 3 results) No results for input(s): "PROBNP" in the last 8760 hours.   Other Studies Reviewed Today:  Echo  11/07/22  Mean gradient 29 peak 53 mmHg    LHC 11/2007 LM: Normal LAD: Proximal to mid 40-50%, ostial D1 40-50% LCx: Normal RCA: Distal 90% EF 60% PCI: DES to the RCA    Assessment/Plan:  CAD:  Stable no angina medical Rx . Non ischemic myovue May 2018 no complications with general anesthesia and knee surgery June 2019 Update myovue given significant valve dx   Benign essential HTN:  Well controlled.  Continue current medications and low sodium Dash type diet.     Hyperlipidemia:  Continue statin labs with primary  Historically LDL <60   Bradycardia:  History of AV block and wenckebach avoid beta blocker no high Grade AV block or indication for PPM  Aortic Stenosis:  TTE done 05/08/23 stable mean gradient 28 peak 51 mmHg AVA 0.9 cm2 DVI 0.28 F/U echo despite age may be a TAVR candidate.   Bruit:  Right side ASA  US  05/04/22 plaque no stenosis   Ortho:  Post right TKR 10/30/17 Dr Rossie Coon improved      Disposition:   f/u in 6 months   Janelle Mediate

## 2023-10-18 DIAGNOSIS — C4442 Squamous cell carcinoma of skin of scalp and neck: Secondary | ICD-10-CM | POA: Diagnosis not present

## 2023-10-18 DIAGNOSIS — D044 Carcinoma in situ of skin of scalp and neck: Secondary | ICD-10-CM | POA: Diagnosis not present

## 2023-10-19 ENCOUNTER — Ambulatory Visit: Payer: Self-pay | Admitting: Cardiovascular Disease

## 2023-10-19 ENCOUNTER — Ambulatory Visit: Payer: Medicare Other | Admitting: Cardiovascular Disease

## 2023-10-19 ENCOUNTER — Encounter: Payer: Self-pay | Admitting: Cardiovascular Disease

## 2023-10-19 ENCOUNTER — Ambulatory Visit (HOSPITAL_COMMUNITY)
Admission: RE | Admit: 2023-10-19 | Discharge: 2023-10-19 | Disposition: A | Discharge status: Home / Self Care | Payer: Medicare Other | Source: Ambulatory Visit | Attending: Internal Medicine | Admitting: Internal Medicine

## 2023-10-19 VITALS — BP 118/60 | HR 95 | Ht 71.0 in | Wt 144.0 lb

## 2023-10-19 DIAGNOSIS — I350 Nonrheumatic aortic (valve) stenosis: Secondary | ICD-10-CM

## 2023-10-19 DIAGNOSIS — I10 Essential (primary) hypertension: Secondary | ICD-10-CM | POA: Diagnosis not present

## 2023-10-19 DIAGNOSIS — I2510 Atherosclerotic heart disease of native coronary artery without angina pectoris: Secondary | ICD-10-CM

## 2023-10-19 DIAGNOSIS — R9431 Abnormal electrocardiogram [ECG] [EKG]: Secondary | ICD-10-CM

## 2023-10-19 LAB — ECHOCARDIOGRAM COMPLETE
AR max vel: 0.8 cm2
AV Area VTI: 0.81 cm2
AV Mean grad: 34 mmHg
AV Peak grad: 68.2 mmHg
Ao pk vel: 4.13 m/s
Area-P 1/2: 2.81 cm2
P 1/2 time: 79 ms
S' Lateral: 2.6 cm

## 2023-10-19 NOTE — Patient Instructions (Addendum)

## 2023-10-20 NOTE — Telephone Encounter (Signed)
 Wife is calling back for test results please call her on her mobile number [phone]

## 2023-10-23 NOTE — Telephone Encounter (Signed)
Patient's wife is returning phone call. Please advise.

## 2023-10-23 NOTE — Telephone Encounter (Signed)
-----   Message from Nurse Aneta Bar sent at 10/23/2023  8:21 AM EDT -----  ----- Message ----- From: Sari Cunning Sent: 10/23/2023   8:06 AM EDT To: [facility] Triage

## 2023-10-23 NOTE — Telephone Encounter (Signed)
 The patient's wife has been notified of the result and verbalized understanding.  All questions (if any) were answered. Antonetta Kitchen, RN 10/23/2023 11:48 AM

## 2023-11-27 ENCOUNTER — Ambulatory Visit (INDEPENDENT_AMBULATORY_CARE_PROVIDER_SITE_OTHER): Admitting: Family Medicine

## 2023-11-27 ENCOUNTER — Encounter: Payer: Self-pay | Admitting: Family Medicine

## 2023-11-27 VITALS — BP 110/62 | HR 52 | Temp 98.0°F | Wt 143.2 lb

## 2023-11-27 DIAGNOSIS — R413 Other amnesia: Secondary | ICD-10-CM

## 2023-11-27 DIAGNOSIS — F419 Anxiety disorder, unspecified: Secondary | ICD-10-CM | POA: Diagnosis not present

## 2023-11-27 NOTE — Progress Notes (Signed)
   Subjective:    Patient ID: [name] [name], male    DOB: 01-03-1936, 88 y.o.   MRN: [id number]  HPI Here with his wife to discuss his memory loss and anxiety. He is taking Memantine  and he tolerates it well, but he is not sure if it is helping. As for the Zoloft , he thinks it is helping and his wife says this has helped his anxiety quite a bit. He has no side effects. He does complain of having trouble following conversations, and his wife asks if he could have a hearing problem. He does not think so.    Review of Systems  Constitutional: Negative.   Respiratory: Negative.    Cardiovascular: Negative.   Psychiatric/Behavioral:  Negative for agitation, behavioral problems, confusion and dysphoric mood. The patient is not nervous/anxious.        Objective:   Physical Exam Constitutional:      Appearance: Normal appearance.  Cardiovascular:     Rate and Rhythm: Normal rate and regular rhythm.     Pulses: Normal pulses.     Heart sounds: Normal heart sounds.  Pulmonary:     Effort: Pulmonary effort is normal.     Breath sounds: Normal breath sounds.  Neurological:     Mental Status: He is alert and oriented to person, place, and time.  Psychiatric:        Mood and Affect: Mood normal.        Behavior: Behavior normal.        Thought Content: Thought content normal.           Assessment & Plan:  As for his memory loss, I reminded them that the purpose of the Memantine  is not to improve his memory but to slow down any declines he may have. Also I urged him to having his hearing checked by an audiologist because this could be a large part of his problem following conversations.  Garnette Olmsted, MD

## 2024-01-14 ENCOUNTER — Other Ambulatory Visit: Payer: Self-pay | Admitting: Family Medicine

## 2024-01-22 DIAGNOSIS — D485 Neoplasm of uncertain behavior of skin: Secondary | ICD-10-CM | POA: Diagnosis not present

## 2024-01-22 DIAGNOSIS — C44329 Squamous cell carcinoma of skin of other parts of face: Secondary | ICD-10-CM | POA: Diagnosis not present

## 2024-02-14 DIAGNOSIS — Z23 Encounter for immunization: Secondary | ICD-10-CM | POA: Diagnosis not present

## 2024-02-21 DIAGNOSIS — C44329 Squamous cell carcinoma of skin of other parts of face: Secondary | ICD-10-CM | POA: Diagnosis not present

## 2024-03-20 DIAGNOSIS — L578 Other skin changes due to chronic exposure to nonionizing radiation: Secondary | ICD-10-CM | POA: Diagnosis not present

## 2024-03-20 DIAGNOSIS — L720 Epidermal cyst: Secondary | ICD-10-CM | POA: Diagnosis not present

## 2024-03-20 DIAGNOSIS — D485 Neoplasm of uncertain behavior of skin: Secondary | ICD-10-CM | POA: Diagnosis not present

## 2024-03-20 DIAGNOSIS — L821 Other seborrheic keratosis: Secondary | ICD-10-CM | POA: Diagnosis not present

## 2024-03-20 DIAGNOSIS — D225 Melanocytic nevi of trunk: Secondary | ICD-10-CM | POA: Diagnosis not present

## 2024-03-20 DIAGNOSIS — L570 Actinic keratosis: Secondary | ICD-10-CM | POA: Diagnosis not present

## 2024-03-20 DIAGNOSIS — Z85828 Personal history of other malignant neoplasm of skin: Secondary | ICD-10-CM | POA: Diagnosis not present

## 2024-03-21 ENCOUNTER — Other Ambulatory Visit: Payer: Self-pay | Admitting: Cardiovascular Disease

## 2024-03-22 MED ORDER — BENAZEPRIL-HYDROCHLOROTHIAZIDE 20-25 MG PO TABS: 1.0000 | ORAL_TABLET | Freq: Every day | ORAL | 2 refills | Status: AC

## 2024-04-18 NOTE — Progress Notes (Signed)
 CARDIOLOGY OFFICE NOTE  Date:  04/25/2024    [name] [name] Date of Birth: 28-Aug-1935 Medical Record #[id number]  PCP:  [name] LABOR, MD  Cardiologist:  [name] chief complaint on file.   History of Present Illness:  88 y.o. history of CAD with DES to RCA 2009. CRF;s HTN, HLD SSS with bradycardia beta blockers avoided  Myovue 09/30/16 old inferior MI no ischemia EF 56% low risk  Daughter works at [employer] company and Rx Leukemia  2nd in charge  Two grand kids in town   Had uneventful right TKR with Dr [name] [name] on October 30 2017  Peritonsillar abscess August 2021   Echo 10/05/20  fairly stable peak velocity 64m/sec gradients 20/36 mmHg DVI 0.32 and AVA 1.1 cm2  Echo 10/15/21 :  mild/mod MR moderate AS mean gradients 26/47.6 mmHg DVI 0.27 AVA 0.85 cm2 Echo 04/28/22 mean gradient 30 peak 51 mmHg AVA 0.9 cm2 He is still asymptomatic Echo 11/07/22 with mean gradient increased from 24 mmHg to 29 and peal from 45 to 53 mmHg No symptoms Echo 05/08/23  with mean gradient 30 peak 52 mmHg AVA 0.85 cm2  Echo 10/19/23 mean gradient 34 peak 68 DVI 0.26 AVA 0.8 cm2 Echo 04/25/24 mean 33 peak 60 AVA 1.2 cm2  Given some progression discussed likely need for TAVR in next year He has no exertional symptoms  Despite age I think he would still be a TAVR candidate given good functional status. Sees dentist every 4 months and discussed importance of this if he is getting a valve in next year   Carotid 05/04/22 plaque no stenosis   Had pinky toe on right foot amputated for infection Has seen dentist and had left sided tooth pulled Sees primary for memory issues and anxiety on Zoloft  Started on Namenda  for his memory 04/03/23   Had left neck skin cancer surgery June 2025  some anxiety and memory loss. He is on Memantine  and Zoloft . Problems following conversations ? From hearing loss as well.   Still golfing and no symptoms   Past Medical History:  Diagnosis Date   Abscess of groin, left     Arthritis    R hip & knees   Cancer (HCC)    basal cell, history of    Closed fracture of right foot    a. 08/2014 Fx of R fifth metatarsal (Jones Fx).   Coronary artery disease    a. 11/2007 Cath/PCI: LM nl, LAD 40-33m, d1 40-50ost, LCX nl, RCA 90d (Platinum Study DES - promus), EF 60%.   Gastroesophageal reflux disease with hiatal hernia    H/O hiatal hernia    Hx MRSA infection    Hypertension    Pre-syncope    a. 08/2014 w/ sinus bradycardia in 40's in ED.    Past Surgical History:  Procedure Laterality Date   AMPUTATION Right 10/22/2022   Procedure: AMPUTATION 2nd TOE;  Surgeon: Harden Jerona GAILS, MD;  Location: [facility] OR;  Service: Orthopedics;  Laterality: Right;   APPENDECTOMY     CARDIAC CATHETERIZATION  12/06/07   60%   COLONOSCOPY  06/08/2015   per Dr. Rosalie, no polyps    CORONARY ANGIOPLASTY WITH STENT PLACEMENT  11/2007   HERNIA REPAIR     KNEE ARTHROSCOPY Right 10/03/2012   Procedure: RIGHT KNEEE ARTHROSCOPY WITH DEBRIDEMENT ;  Surgeon: Dempsey GAILS Moan, MD;  Location: [facility] ORS;  Service: Orthopedics;  Laterality: Right;   SHOULDER SURGERY  05/2016   rotator cuff  and ligament repair    TOTAL HIP ARTHROPLASTY Right 04/05/2013   Procedure: RIGHT TOTAL HIP ARTHROPLASTY ANTERIOR APPROACH;  Surgeon: Dempsey LULLA Moan, MD;  Location: [facility] OR;  Service: Orthopedics;  Laterality: Right;   TOTAL KNEE ARTHROPLASTY Right 10/30/2017   Procedure: RIGHT TOTAL KNEE ARTHROPLASTY;  Surgeon: Moan Dempsey, MD;  Location: [facility] ORS;  Service: Orthopedics;  Laterality: Right;   YAG LASER APPLICATION     for glaucoma - both eyes, no drops as of yet      Medications: Current Outpatient Medications  Medication Sig Dispense Refill   acetaminophen  (TYLENOL ) 325 MG tablet Take 1-2 tablets (325-650 mg total) by mouth every 6 (six) hours as needed for mild pain (pain score 1-3 or temp > 100.5).     amoxicillin  (AMOXIL ) 500 MG capsule Take 4 capsules by mouth once as needed (for dental procedures).     aspirin   81 MG EC tablet Take 81 mg by mouth daily. Swallow whole.     benazepril -hydrochlorthiazide (LOTENSIN  HCT) 20-25 MG tablet Take 1 tablet by mouth daily. 90 tablet 2   cholecalciferol  (VITAMIN D ) 1000 UNITS tablet Take 1,000 Units by mouth daily.      loperamide  (IMODIUM ) 2 MG capsule Take 1 capsule (2 mg total) by mouth as needed for diarrhea or loose stools. 30 capsule 0   memantine  (NAMENDA ) 10 MG tablet TAKE 1 TABLET BY MOUTH TWICE A DAY 180 tablet 2   Multiple Vitamin (MULTIVITAMIN WITH MINERALS) TABS tablet Take 1 tablet by mouth daily with breakfast.     nitroGLYCERIN  (NITROSTAT ) 0.4 MG SL tablet Place 1 tablet (0.4 mg total) under the tongue every 5 (five) minutes as needed for chest pain (3 doses MAX). 25 tablet 3   Probiotic Product (PROBIOTIC PO) Take 1 capsule by mouth daily with breakfast.     rosuvastatin  (CRESTOR ) 20 MG tablet TAKE 1 TABLET BY MOUTH EVERY OTHER DAY 45 tablet 2   sertraline  (ZOLOFT ) 50 MG tablet TAKE 1 TABLET BY MOUTH EVERY DAY 90 tablet 0   No current facility-administered medications for this visit.    Allergies: No Known Allergies  Social History: The patient  reports that he quit smoking about 62 years ago. His smoking use included cigarettes. He has never used smokeless tobacco. He reports that he does not currently use alcohol. He reports that he does not use drugs.   Family History: The patient's family history includes Heart disease in an other family member; Hypertension in his brother, sister, and another family member; Stomach cancer in his father.   Review of Systems: Please see the history of present illness.   Otherwise, the review of systems is positive for none.   All other systems are reviewed and negative.   Physical Exam: VS:  There were no vitals taken for this visit. [name]  BMI There is no height or weight on file to calculate BMI.  Wt Readings from Last 3 Encounters:  11/27/23 143 lb 3.2 oz (65 kg)  10/19/23 144 lb (65.3 kg)  09/15/23  144 lb 9.6 oz (65.6 kg)    Affect appropriate Healthy:  appears stated age HEENT: normal Neck supple with no adenopathy JVP normal no bruits no thyromegaly Lungs clear with no wheezing and good diaphragmatic motion Heart:  S1/S2 mild  AS  murmur, no rub, gallop or click PMI normal Abdomen: benighn, BS positve, no tenderness, no AAA no bruit.  No HSM or HJR Distal pulses intact post right 4/5th toe amputation  No edema  Neuro non-focal Skin warm and dry Post right TKR    LABORATORY DATA:  EKG:   10/04/19 NSR normal ECG 04/25/2024 SR rate 52 normal  04/25/2024 SR rate 72 PR 254 msec otherwise normal   Lab Results  Component Value Date   WBC 7.7 10/22/2022   HGB 13.6 10/22/2022   HCT 39.1 10/22/2022   PLT 460 (H) 10/22/2022   GLUCOSE 98 10/22/2022   CHOL 149 01/09/2019   TRIG 110.0 01/09/2019   HDL 70.10 01/09/2019   LDLCALC 57 01/09/2019   ALT 17 10/19/2022   AST 19 10/19/2022   NA 139 10/22/2022   K 3.4 (L) 10/22/2022   CL 102 10/22/2022   CREATININE 1.13 10/22/2022   BUN 15 10/22/2022   CO2 25 10/22/2022   TSH 1.83 01/09/2019   PSA 1.78 01/09/2019   INR 1.00 10/19/2017   HGBA1C  12/07/2007    5.5 (NOTE)   The ADA recommends the following therapeutic goal for glycemic   control related to Hgb A1C measurement:   Goal of Therapy:   < 7.0% Hgb A1C   Reference: American Diabetes Association: Clinical Practice   Recommendations 2008, Diabetes Care,  2008, 31:(Suppl 1).    BNP (last 3 results) No results for input(s): BNP in the last 8760 hours.  ProBNP (last 3 results) No results for input(s): PROBNP in the last 8760 hours.   Other Studies Reviewed Today:  Echo  11/07/22  Mean gradient 29 peak 53 mmHg    LHC 11/2007 LM: Normal LAD: Proximal to mid 40-50%, ostial D1 40-50% LCx: Normal RCA: Distal 90% EF 60% PCI: DES to the RCA    Assessment/Plan:  CAD:  Stable no angina medical Rx . Non ischemic myovue May 2018 no complications with general  anesthesia and knee surgery June 2019     Benign essential HTN:  Well controlled.  Continue current medications and low sodium Dash type diet.     Hyperlipidemia:  Continue statin labs with primary Historically LDL <60   Bradycardia:  History of AV block and wenckebach avoid beta blocker no high Grade AV block or indication for PPM  Aortic Stenosis:  TTE done 05/08/23 stable mean gradient 28 peak 51 mmHg AVA 0.9 cm2 DVI 0.28 F/U echo despite age may be a TAVR candidate.   Bruit:  Right side ASA  US  05/04/22 plaque no stenosis   Ortho:  Post right TKR 10/30/17 Dr [name] [name] improved    F/U 6 months with echo in a year  Disposition:   f/u in 6 months   Maude Emmer

## 2024-04-24 ENCOUNTER — Other Ambulatory Visit (HOSPITAL_COMMUNITY)

## 2024-04-25 ENCOUNTER — Ambulatory Visit (HOSPITAL_COMMUNITY)
Admission: RE | Admit: 2024-04-25 | Discharge: 2024-04-25 | Attending: Cardiovascular Disease | Admitting: Cardiovascular Disease

## 2024-04-25 ENCOUNTER — Ambulatory Visit: Admitting: Cardiovascular Disease

## 2024-04-25 DIAGNOSIS — I350 Nonrheumatic aortic (valve) stenosis: Secondary | ICD-10-CM | POA: Diagnosis not present

## 2024-04-25 DIAGNOSIS — I2510 Atherosclerotic heart disease of native coronary artery without angina pectoris: Secondary | ICD-10-CM

## 2024-04-25 DIAGNOSIS — I10 Essential (primary) hypertension: Secondary | ICD-10-CM | POA: Insufficient documentation

## 2024-04-25 LAB — ECHOCARDIOGRAM COMPLETE
AR max vel: 1.22 cm2
AV Area VTI: 1.17 cm2
AV Area mean vel: 1.32 cm2
AV Mean grad: 32 mmHg
AV Peak grad: 67.2 mmHg
Ao pk vel: 4.1 m/s
Area-P 1/2: 2.05 cm2
S' Lateral: 2 cm

## 2024-04-25 NOTE — Patient Instructions (Signed)
 Medication Instructions:  Your physician recommends that you continue on your current medications as directed. Please refer to the Current Medication list given to you today.  *If you need a refill on your cardiac medications before your next appointment, please call your pharmacy*  Lab Work: NONE If you have labs (blood work) drawn today and your tests are completely normal, you will receive your results only by: MyChart Message (if you have MyChart) OR A paper copy in the mail If you have any lab test that is abnormal or we need to change your treatment, we will call you to review the results.  Testing/Procedures: NONE  Follow-Up: At [facility], you and your health needs are our priority.  As part of our continuing mission to provide you with exceptional heart care, our providers are all part of one team.  This team includes your primary Cardiologist (physician) and Advanced Practice Providers or APPs (Physician Assistants and Nurse Practitioners) who all work together to provide you with the care you need, when you need it.  Your next appointment:   6 month(s)  Provider:   Janelle Mediate, MD   We recommend signing up for the patient portal called "MyChart".  Sign up information is provided on this After Visit Summary.  MyChart is used to connect with patients for Virtual Visits (Telemedicine).  Patients are able to view lab/test results, encounter notes, upcoming appointments, etc.  Non-urgent messages can be sent to your provider as well.   To learn more about what you can do with MyChart, go to [email or web address].

## 2024-04-26 DIAGNOSIS — H26493 Other secondary cataract, bilateral: Secondary | ICD-10-CM | POA: Diagnosis not present

## 2024-04-26 DIAGNOSIS — H43823 Vitreomacular adhesion, bilateral: Secondary | ICD-10-CM | POA: Diagnosis not present

## 2024-04-26 DIAGNOSIS — H04123 Dry eye syndrome of bilateral lacrimal glands: Secondary | ICD-10-CM | POA: Diagnosis not present

## 2024-04-26 DIAGNOSIS — H18513 Endothelial corneal dystrophy, bilateral: Secondary | ICD-10-CM | POA: Diagnosis not present

## 2024-05-27 ENCOUNTER — Telehealth: Payer: Self-pay | Admitting: Cardiovascular Disease

## 2024-05-27 ENCOUNTER — Other Ambulatory Visit: Payer: Self-pay

## 2024-05-27 NOTE — Telephone Encounter (Signed)
" °*  STAT* If patient is at the pharmacy, call can be transferred to refill team.   1. Which medications need to be refilled? (please list name of each medication and dose if known)  nitroGLYCERIN  (NITROSTAT ) 0.4 MG SL tablet     2. Would you like to learn more about the convenience, safety, & potential cost savings by using the [facility]? no   3. Are you open to using the [facility] (Type [facility]. no   4. Which pharmacy/location (including street and city if local pharmacy) is medication to be sent to?  [id number] - [place], [place] - [address]     5. Do they need a 30 day or 90 day supply?   "

## 2024-05-28 MED ORDER — NITROGLYCERIN 0.4 MG SL SUBL: 0.4000 mg | SUBLINGUAL_TABLET | SUBLINGUAL | 11 refills | Status: AC | PRN

## 2024-05-28 NOTE — Telephone Encounter (Signed)
Refill has already been done.
